# Patient Record
Sex: Female | Born: 1985 | Race: White | Hispanic: No | Marital: Married | Smoking: Never smoker
Health system: Southern US, Community
[De-identification: ages and names within clinical notes are randomized; demographics above are authoritative.]

## PROBLEM LIST (undated history)

## (undated) DIAGNOSIS — C801 Malignant (primary) neoplasm, unspecified: Secondary | ICD-10-CM

## (undated) DIAGNOSIS — J45909 Unspecified asthma, uncomplicated: Secondary | ICD-10-CM

## (undated) DIAGNOSIS — G43909 Migraine, unspecified, not intractable, without status migrainosus: Secondary | ICD-10-CM

## (undated) DIAGNOSIS — I10 Essential (primary) hypertension: Secondary | ICD-10-CM

## (undated) DIAGNOSIS — N390 Urinary tract infection, site not specified: Secondary | ICD-10-CM

## (undated) HISTORY — DX: Urinary tract infection, site not specified: N39.0

## (undated) HISTORY — PX: ESOPHAGOGASTRODUODENOSCOPY: SHX1529

## (undated) HISTORY — DX: Unspecified asthma, uncomplicated: J45.909

## (undated) HISTORY — DX: Migraine, unspecified, not intractable, without status migrainosus: G43.909

---
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
## 2003-04-23 ENCOUNTER — Other Ambulatory Visit: Admission: RE | Admit: 2003-04-23 | Discharge: 2003-04-23 | Payer: Self-pay | Admitting: Obstetrics and Gynecology

## 2004-06-17 ENCOUNTER — Other Ambulatory Visit: Admission: RE | Admit: 2004-06-17 | Discharge: 2004-06-17 | Payer: Self-pay | Admitting: Obstetrics and Gynecology

## 2006-05-04 ENCOUNTER — Encounter: Admission: RE | Admit: 2006-05-04 | Discharge: 2006-05-04 | Payer: Self-pay | Admitting: Orthopedic Surgery

## 2012-11-16 ENCOUNTER — Telehealth: Payer: Self-pay | Admitting: Certified Nurse Midwife

## 2012-11-16 NOTE — Telephone Encounter (Signed)
Patient C/O Possible UTI. Can't find an appointment .

## 2012-11-16 NOTE — Telephone Encounter (Signed)
LMTCB 11/16/12 cm

## 2012-11-16 NOTE — Telephone Encounter (Signed)
LMTCB

## 2012-11-20 NOTE — Telephone Encounter (Signed)
Left message on CB# to return call concerning phone call of 06/27/ for UTI.

## 2012-11-21 NOTE — Telephone Encounter (Signed)
Follow up  Phone call with patient from 11/16/2012 with C/O UTI.  Patient stated she wound up going to her primary doctor for Tx of symptoms and is ok now and on medications.

## 2012-11-27 DIAGNOSIS — J45998 Other asthma: Secondary | ICD-10-CM | POA: Insufficient documentation

## 2013-04-09 ENCOUNTER — Encounter: Payer: Self-pay | Admitting: Certified Nurse Midwife

## 2013-04-11 ENCOUNTER — Encounter: Payer: Self-pay | Admitting: Certified Nurse Midwife

## 2013-04-11 ENCOUNTER — Ambulatory Visit (INDEPENDENT_AMBULATORY_CARE_PROVIDER_SITE_OTHER): Payer: PRIVATE HEALTH INSURANCE | Admitting: Certified Nurse Midwife

## 2013-04-11 VITALS — BP 116/78 | HR 64 | Resp 14 | Ht 65.5 in | Wt 201.0 lb

## 2013-04-11 DIAGNOSIS — Z309 Encounter for contraceptive management, unspecified: Secondary | ICD-10-CM

## 2013-04-11 DIAGNOSIS — Z01419 Encounter for gynecological examination (general) (routine) without abnormal findings: Secondary | ICD-10-CM

## 2013-04-11 DIAGNOSIS — Z0000 Encounter for general adult medical examination without abnormal findings: Secondary | ICD-10-CM

## 2013-04-11 DIAGNOSIS — J329 Chronic sinusitis, unspecified: Secondary | ICD-10-CM | POA: Insufficient documentation

## 2013-04-11 DIAGNOSIS — R319 Hematuria, unspecified: Secondary | ICD-10-CM

## 2013-04-11 LAB — POCT URINALYSIS DIPSTICK: Blood, UA: 2

## 2013-04-11 MED ORDER — LEVONORGEST-ETH ESTRAD 91-DAY 0.15-0.03 &0.01 MG PO TABS: 1.0000 | ORAL_TABLET | Freq: Every day | ORAL | Status: DC

## 2013-04-11 NOTE — Patient Instructions (Signed)
General topics  Next pap or exam is  due in 1 year Take a Women's multivitamin Take 1200 mg. of calcium daily - prefer dietary If any concerns in interim to call back  Breast Self-Awareness Practicing breast self-awareness may pick up problems early, prevent significant medical complications, and possibly save your life. By practicing breast self-awareness, you can become familiar with how your breasts look and feel and if your breasts are changing. This allows you to notice changes early. It can also offer you some reassurance that your breast health is good. One way to learn what is normal for your breasts and whether your breasts are changing is to do a breast self-exam. If you find a lump or something that was not present in the past, it is best to contact your caregiver right away. Other findings that should be evaluated by your caregiver include nipple discharge, especially if it is bloody; skin changes or reddening; areas where the skin seems to be pulled in (retracted); or new lumps and bumps. Breast pain is seldom associated with cancer (malignancy), but should also be evaluated by a caregiver. BREAST SELF-EXAM The best time to examine your breasts is 5 7 days after your menstrual period is over.  ExitCare Patient Information 2013 [employer].   Exercise to Stay Healthy Exercise helps you become and stay healthy. EXERCISE IDEAS AND TIPS Choose exercises that:  You enjoy.  Fit into your day. You do not need to exercise really hard to be healthy. You can do exercises at a slow or medium level and stay healthy. You can:  Stretch before and after working out.  Try yoga, Pilates, or tai chi.  Lift weights.  Walk fast, swim, jog, run, climb stairs, bicycle, dance, or rollerskate.  Take aerobic classes. Exercises that burn about 150 calories:  Running 1  miles in 15 minutes.  Playing volleyball for 45 to 60 minutes.  Washing and waxing a car for 45 to 60  minutes.  Playing touch football for 45 minutes.  Walking 1  miles in 35 minutes.  Pushing a stroller 1  miles in 30 minutes.  Playing basketball for 30 minutes.  Raking leaves for 30 minutes.  Bicycling 5 miles in 30 minutes.  Walking 2 miles in 30 minutes.  Dancing for 30 minutes.  Shoveling snow for 15 minutes.  Swimming laps for 20 minutes.  Walking up stairs for 15 minutes.  Bicycling 4 miles in 15 minutes.  Gardening for 30 to 45 minutes.  Jumping rope for 15 minutes.  Washing windows or floors for 45 to 60 minutes. Document Released: 06/11/2010 Document Revised: 08/01/2011 Document Reviewed: 06/11/2010 ExitCare Patient Information 2013 [employer].   Other topics ( that may be useful information):    Sexually Transmitted Disease Sexually transmitted disease (STD) refers to any infection that is passed from person to person during sexual activity. This may happen by way of saliva, semen, blood, vaginal mucus, or urine. Common STDs include:  Gonorrhea.  Chlamydia.  Syphilis.  HIV/AIDS.  Genital herpes.  Hepatitis B and C.  Trichomonas.  Human papillomavirus (HPV).  Pubic lice. CAUSES  An STD may be spread by bacteria, virus, or parasite. A person can get an STD by:  Sexual intercourse with an infected person.  Sharing sex toys with an infected person.  Sharing needles with an infected person.  Having intimate contact with the genitals, mouth, or rectal areas of an infected person. SYMPTOMS  Some people may not have any symptoms, but   they can still pass the infection to others. Different STDs have different symptoms. Symptoms include:  Painful or bloody urination.  Pain in the pelvis, abdomen, vagina, anus, throat, or eyes.  Skin rash, itching, irritation, growths, or sores (lesions). These usually occur in the genital or anal area.  Abnormal vaginal discharge.  Penile discharge in men.  Soft, flesh-colored skin growths in the  genital or anal area.  Fever.  Pain or bleeding during sexual intercourse.  Swollen glands in the groin area.  Yellow skin and eyes (jaundice). This is seen with hepatitis. DIAGNOSIS  To make a diagnosis, your caregiver may:  Take a medical history.  Perform a physical exam.  Take a specimen (culture) to be examined.  Examine a sample of discharge under a microscope.  Perform blood test TREATMENT   Chlamydia, gonorrhea, trichomonas, and syphilis can be cured with antibiotic medicine.  Genital herpes, hepatitis, and HIV can be treated, but not cured, with prescribed medicines. The medicines will lessen the symptoms.  Genital warts from HPV can be treated with medicine or by freezing, burning (electrocautery), or surgery. Warts may come back.  HPV is a virus and cannot be cured with medicine or surgery.However, abnormal areas may be followed very closely by your caregiver and may be removed from the cervix, vagina, or vulva through office procedures or surgery. If your diagnosis is confirmed, your recent sexual partners need treatment. This is true even if they are symptom-free or have a negative culture or evaluation. They should not have sex until their caregiver says it is okay. HOME CARE INSTRUCTIONS  All sexual partners should be informed, tested, and treated for all STDs.  Take your antibiotics as directed. Finish them even if you start to feel better.  Only take over-the-counter or prescription medicines for pain, discomfort, or fever as directed by your caregiver.  Rest.  Eat a balanced diet and drink enough fluids to keep your urine clear or pale yellow.  Do not have sex until treatment is completed and you have followed up with your caregiver. STDs should be checked after treatment.  Keep all follow-up appointments, Pap tests, and blood tests as directed by your caregiver.  Only use latex condoms and water-soluble lubricants during sexual activity. Do not use  petroleum jelly or oils.  Avoid alcohol and illegal drugs.  Get vaccinated for HPV and hepatitis. If you have not received these vaccines in the past, talk to your caregiver about whether one or both might be right for you.  Avoid risky sex practices that can break the skin. The only way to avoid getting an STD is to avoid all sexual activity.Latex condoms and dental dams (for oral sex) will help lessen the risk of getting an STD, but will not completely eliminate the risk. SEEK MEDICAL CARE IF:   You have a fever.  You have any new or worsening symptoms. Document Released: 07/30/2002 Document Revised: 08/01/2011 Document Reviewed: 08/06/2010 ExitCare Patient Information 2013 [employer].    Domestic Abuse You are being battered or abused if someone close to you hits, pushes, or physically hurts you in any way. You also are being abused if you are forced into activities. You are being sexually abused if you are forced to have sexual contact of any kind. You are being emotionally abused if you are made to feel worthless or if you are constantly threatened. It is important to remember that help is available. No one has the right to abuse you. PREVENTION OF FURTHER   ABUSE  Learn the warning signs of danger. This varies with situations but may include: the use of alcohol, threats, isolation from friends and family, or forced sexual contact. Leave if you feel that violence is going to occur.  If you are attacked or beaten, report it to the police so the abuse is documented. You do not have to press charges. The police can protect you while you or the attackers are leaving. Get the officer's name and badge number and a copy of the report.  Find someone you can trust and tell them what is happening to you: your caregiver, a nurse, clergy member, close friend or family member. Feeling ashamed is natural, but remember that you have done nothing wrong. No one deserves abuse. Document Released:  05/06/2000 Document Revised: 08/01/2011 Document Reviewed: 07/15/2010 ExitCare Patient Information 2013 [employer].    How Much is Too Much Alcohol? Drinking too much alcohol can cause injury, accidents, and health problems. These types of problems can include:   Car crashes.  Falls.  Family fighting (domestic violence).  Drowning.  Fights.  Injuries.  Burns.  Damage to certain organs.  Having a baby with birth defects. ONE DRINK CAN BE TOO MUCH WHEN YOU ARE:  Working.  Pregnant or breastfeeding.  Taking medicines. Ask your doctor.  Driving or planning to drive. If you or someone you know has a drinking problem, get help from a doctor.  Document Released: 03/05/2009 Document Revised: 08/01/2011 Document Reviewed: 03/05/2009 ExitCare Patient Information 2013 [employer].   Smoking Hazards Smoking cigarettes is extremely bad for your health. Tobacco smoke has over 200 known poisons in it. There are over 60 chemicals in tobacco smoke that cause cancer. Some of the chemicals found in cigarette smoke include:   Cyanide.  Benzene.  Formaldehyde.  Methanol (wood alcohol).  Acetylene (fuel used in welding torches).  Ammonia. Cigarette smoke also contains the poisonous gases nitrogen oxide and carbon monoxide.  Cigarette smokers have an increased risk of many serious medical problems and Smoking causes approximately:  90% of all lung cancer deaths in men.  80% of all lung cancer deaths in women.  90% of deaths from chronic obstructive lung disease. Compared with nonsmokers, smoking increases the risk of:  Coronary heart disease by 2 to 4 times.  Stroke by 2 to 4 times.  Men developing lung cancer by 23 times.  Women developing lung cancer by 13 times.  Dying from chronic obstructive lung diseases by 12 times.  . Smoking is the most preventable cause of death and disease in our society.  WHY IS SMOKING ADDICTIVE?  Nicotine is the chemical  agent in tobacco that is capable of causing addiction or dependence.  When you smoke and inhale, nicotine is absorbed rapidly into the bloodstream through your lungs. Nicotine absorbed through the lungs is capable of creating a powerful addiction. Both inhaled and non-inhaled nicotine may be addictive.  Addiction studies of cigarettes and spit tobacco show that addiction to nicotine occurs mainly during the teen years, when young people begin using tobacco products. WHAT ARE THE BENEFITS OF QUITTING?  There are many health benefits to quitting smoking.   Likelihood of developing cancer and heart disease decreases. Health improvements are seen almost immediately.  Blood pressure, pulse rate, and breathing patterns start returning to normal soon after quitting. QUITTING SMOKING   American Lung Association - 1-800-LUNGUSA  American Cancer Society - 1-800-ACS-2345 Document Released: 06/16/2004 Document Revised: 08/01/2011 Document Reviewed: 02/18/2009 ExitCare Patient Information 2013 [employer].   Stress Management Stress is a state of physical or mental tension that often results from changes in your life or normal routine. Some common causes of stress are:  Death of a loved one.  Injuries or severe illnesses.  Getting fired or changing jobs.  Moving into a new home. Other causes may be:  Sexual problems.  Business or financial losses.  Taking on a large debt.  Regular conflict with someone at home or at work.  Constant tiredness from lack of sleep. It is not just bad things that are stressful. It may be stressful to:  Win the lottery.  Get married.  Buy a new car. The amount of stress that can be easily tolerated varies from person to person. Changes generally cause stress, regardless of the types of change. Too much stress can affect your health. It may lead to physical or emotional problems. Too little stress (boredom) may also become stressful. SUGGESTIONS TO  REDUCE STRESS:  Talk things over with your family and friends. It often is helpful to share your concerns and worries. If you feel your problem is serious, you may want to get help from a professional counselor.  Consider your problems one at a time instead of lumping them all together. Trying to take care of everything at once may seem impossible. List all the things you need to do and then start with the most important one. Set a goal to accomplish 2 or 3 things each day. If you expect to do too many in a single day you will naturally fail, causing you to feel even more stressed.  Do not use alcohol or drugs to relieve stress. Although you may feel better for a short time, they do not remove the problems that caused the stress. They can also be habit forming.  Exercise regularly - at least 3 times per week. Physical exercise can help to relieve that "uptight" feeling and will relax you.  The shortest distance between despair and hope is often a good night's sleep.  Go to bed and get up on time allowing yourself time for appointments without being rushed.  Take a short "time-out" period from any stressful situation that occurs during the day. Close your eyes and take some deep breaths. Starting with the muscles in your face, tense them, hold it for a few seconds, then relax. Repeat this with the muscles in your neck, shoulders, hand, stomach, back and legs.  Take good care of yourself. Eat a balanced diet and get plenty of rest.  Schedule time for having fun. Take a break from your daily routine to relax. HOME CARE INSTRUCTIONS   Call if you feel overwhelmed by your problems and feel you can no longer manage them on your own.  Return immediately if you feel like hurting yourself or someone else. Document Released: 11/02/2000 Document Revised: 08/01/2011 Document Reviewed: 06/25/2007 ExitCare Patient Information 2013 [employer].   

## 2013-04-11 NOTE — Progress Notes (Signed)
27 y.o. G0P0000 Single Caucasian Fe here for annual exam. Periods normal with extended cycle OCP. Asthma flaring now, but using medications, more stable now.Misty Combs PCP prn and allergist at [facility]. Patient denies UTI symptoms of frequency,urgency, or pain. Recent sexual activity in past 24 hours. History of UTI with sexual activity occasionally. Denies back pain or fever or chills. Patient has resumed exercise now that asthma stable. Requests Hgb A1C due work health recommendation with family history and health profile. No other health issues today.    Patient's last menstrual period was 03/22/2013.          Sexually active: yes  The current method of family planning is OCP (estrogen/progesterone).    Exercising: yes  running, weights, yoga, hiking, biking 4-5x/wk Smoker:  no  Health Maintenance: Pap:  04/05/10 Negative MMG:  none Colonoscopy:  none BMD:   none TDaP:  2006 Labs: Hgb: 13 ; Urine: Leuks 2, Blood 2, Trace Protein    reports that she has never smoked. She does not have any smokeless tobacco history on file. She reports that she drinks about 0.5 ounces of alcohol per week. She reports that she does not use illicit drugs.  Past Medical History  Diagnosis Date  . Migraines     no aura  . UTI (lower urinary tract infection)     post coital  . Asthma     exercise induced  . Chronic sinusitis     History reviewed. No pertinent past surgical history.  Current Outpatient Prescriptions  Medication Sig Dispense Refill  . albuterol (PROVENTIL HFA;VENTOLIN HFA) 108 (90 BASE) MCG/ACT inhaler Inhale into the lungs every 6 (six) hours as needed for wheezing or shortness of breath.      . Ascorbic Acid (VITAMIN C PO) Take by mouth daily.      . fluticasone-salmeterol (ADVAIR HFA) 115-21 MCG/ACT inhaler Inhale 2 puffs into the lungs 2 (two) times daily.      . Levonorgestrel-Ethinyl Estradiol (SEASONIQUE) 0.15-0.03 &0.01 MG tablet Take 1 tablet by mouth daily.      . Multiple  Vitamins-Minerals (MULTIVITAMIN PO) Take by mouth daily.       No current facility-administered medications for this visit.    Family History  Problem Relation Age of Onset  . Hypertension Mother   . Heart disease Father     ROS:  Pertinent items are noted in HPI.  Otherwise, a comprehensive ROS was negative.  Exam:   BP 116/78  Pulse 64  Resp 14  Ht 5' 5.5" (1.664 m)  Wt 201 lb (91.173 kg)  BMI 32.93 kg/m2  LMP 03/22/2013 Height: 5' 5.5" (166.4 cm)  Ht Readings from Last 3 Encounters:  04/11/13 5' 5.5" (1.664 m)    General appearance: alert, cooperative and appears stated age Head: Normocephalic, without obvious abnormality, atraumatic Neck: no adenopathy, supple, symmetrical, trachea midline and thyroid normal to inspection and palpation Lungs: clear to auscultation bilaterally Breasts: normal appearance, no masses or tenderness, No nipple retraction or dimpling, No nipple discharge or bleeding, No axillary or supraclavicular adenopathy Heart: regular rate and rhythm Abdomen: soft, non-tender; no masses,  no organomegaly Extremities: extremities normal, atraumatic, no cyanosis or edema Skin: Skin color, texture, turgor normal. No rashes or lesions Lymph nodes: Cervical, supraclavicular, and axillary nodes normal. No abnormal inguinal nodes palpated Neurologic: Grossly normal   Pelvic: External genitalia:  no lesions              Urethra:  normal appearing urethra with no masses,  tenderness or lesions              Bartholin's and Skene's: normal                 Vagina: normal appearing vagina with normal color and discharge, no lesions              Cervix: normal, non tender              Pap taken: yes Bimanual Exam:  Uterus:  normal size, contour, position, consistency, mobility, non-tender and anteverted              Adnexa: normal adnexa and no mass, fullness, tenderness               Rectovaginal: Confirms               Anus:  normal sphincter tone, no  lesions  A:  Well Woman with normal exam  Contraception OCP desired  History of Post coital UTI has 2+ blood,2+ WBC in urine  Wellness profile at work recommends glucose screen  P:   Reviewed health and wellness pertinent  Rx Seasonique see order  Aware of need to empty bladder past coitus and increase water.Lab: Urine culture, micro. If positive will treat and consider prophylactic Macrobid use.  Lab Hgb A1C  Pap smear as per guidelines   pap smear taken today with reflex  counseled on breast self exam, use and side effects of OCP's, adequate intake of calcium and vitamin D, diet and exercise  return annually or prn  An After Visit Summary was printed and given to the patient.

## 2013-04-12 LAB — URINALYSIS, MICROSCOPIC ONLY
Casts: NONE SEEN
Crystals: NONE SEEN

## 2013-04-13 LAB — URINE CULTURE: Colony Count: >=100000

## 2013-04-15 ENCOUNTER — Other Ambulatory Visit: Payer: Self-pay | Admitting: Certified Nurse Midwife

## 2013-04-15 NOTE — Progress Notes (Signed)
Note reviewed, agree with plan.  Degan Hanser, MD  

## 2013-04-16 ENCOUNTER — Telehealth: Payer: Self-pay

## 2013-04-16 ENCOUNTER — Other Ambulatory Visit: Payer: Self-pay | Admitting: Certified Nurse Midwife

## 2013-04-16 DIAGNOSIS — N390 Urinary tract infection, site not specified: Secondary | ICD-10-CM

## 2013-04-16 MED ORDER — AZITHROMYCIN 1 G PO PACK: 1.0000 | PACK | Freq: Once | ORAL | Status: DC

## 2013-04-16 NOTE — Telephone Encounter (Signed)
Returning a call to Joy. °

## 2013-04-16 NOTE — Telephone Encounter (Signed)
Message copied by Eliezer Bottom on Tue Apr 16, 2013  3:59 PM ------      Message from: Verner Chol      Created: Tue Apr 16, 2013  8:44 AM       Notify patient that her urine culture was positive for group B Strep needs Rx Azithromycin order in      Recheck urine in 2 weeks TOC culture no ov, order in      Hgb A1c normal      Pap smear nornal      02 ------

## 2013-04-16 NOTE — Telephone Encounter (Signed)
lmtcb

## 2013-04-17 NOTE — Telephone Encounter (Signed)
Patient notified of results.

## 2013-05-01 ENCOUNTER — Ambulatory Visit (INDEPENDENT_AMBULATORY_CARE_PROVIDER_SITE_OTHER): Payer: PRIVATE HEALTH INSURANCE | Admitting: *Deleted

## 2013-05-01 DIAGNOSIS — N390 Urinary tract infection, site not specified: Secondary | ICD-10-CM

## 2013-05-03 LAB — URINE CULTURE
Colony Count: NO GROWTH
Organism ID, Bacteria: NO GROWTH

## 2013-06-13 ENCOUNTER — Other Ambulatory Visit: Payer: Self-pay | Admitting: Certified Nurse Midwife

## 2014-04-22 ENCOUNTER — Encounter: Payer: Self-pay | Admitting: Certified Nurse Midwife

## 2014-04-22 ENCOUNTER — Ambulatory Visit (INDEPENDENT_AMBULATORY_CARE_PROVIDER_SITE_OTHER): Payer: PRIVATE HEALTH INSURANCE | Admitting: Certified Nurse Midwife

## 2014-04-22 VITALS — BP 118/78 | HR 68 | Resp 16 | Ht 64.75 in | Wt 198.6 lb

## 2014-04-22 DIAGNOSIS — B3731 Acute candidiasis of vulva and vagina: Secondary | ICD-10-CM

## 2014-04-22 DIAGNOSIS — Z0000 Encounter for general adult medical examination without abnormal findings: Secondary | ICD-10-CM

## 2014-04-22 DIAGNOSIS — Z23 Encounter for immunization: Secondary | ICD-10-CM

## 2014-04-22 DIAGNOSIS — Z3041 Encounter for surveillance of contraceptive pills: Secondary | ICD-10-CM

## 2014-04-22 DIAGNOSIS — B373 Candidiasis of vulva and vagina: Secondary | ICD-10-CM

## 2014-04-22 DIAGNOSIS — Z01419 Encounter for gynecological examination (general) (routine) without abnormal findings: Secondary | ICD-10-CM

## 2014-04-22 LAB — POCT URINALYSIS DIPSTICK
Bilirubin, UA: NEGATIVE
GLUCOSE UA: NEGATIVE
KETONES UA: NEGATIVE
Nitrite, UA: NEGATIVE
PH UA: 5
PROTEIN UA: NEGATIVE
RBC UA: NEGATIVE
Urobilinogen, UA: NEGATIVE

## 2014-04-22 MED ORDER — TERCONAZOLE 0.4 % VA CREA: 1.0000 | TOPICAL_CREAM | Freq: Every day | VAGINAL | Status: DC

## 2014-04-22 MED ORDER — LEVONORGEST-ETH ESTRAD 91-DAY 0.15-0.03 &0.01 MG PO TABS: 1.0000 | ORAL_TABLET | Freq: Every day | ORAL | Status: DC

## 2014-04-22 NOTE — Patient Instructions (Signed)
General topics  Next pap or exam is  due in 1 year Take a Women's multivitamin Take 1200 mg. of calcium daily - prefer dietary If any concerns in interim to call back  Breast Self-Awareness Practicing breast self-awareness may pick up problems early, prevent significant medical complications, and possibly save your life. By practicing breast self-awareness, you can become familiar with how your breasts look and feel and if your breasts are changing. This allows you to notice changes early. It can also offer you some reassurance that your breast health is good. One way to learn what is normal for your breasts and whether your breasts are changing is to do a breast self-exam. If you find a lump or something that was not present in the past, it is best to contact your caregiver right away. Other findings that should be evaluated by your caregiver include nipple discharge, especially if it is bloody; skin changes or reddening; areas where the skin seems to be pulled in (retracted); or new lumps and bumps. Breast pain is seldom associated with cancer (malignancy), but should also be evaluated by a caregiver. BREAST SELF-EXAM The best time to examine your breasts is 5 7 days after your menstrual period is over.  ExitCare Patient Information 2013 [employer].   Exercise to Stay Healthy Exercise helps you become and stay healthy. EXERCISE IDEAS AND TIPS Choose exercises that:  You enjoy.  Fit into your day. You do not need to exercise really hard to be healthy. You can do exercises at a slow or medium level and stay healthy. You can:  Stretch before and after working out.  Try yoga, Pilates, or tai chi.  Lift weights.  Walk fast, swim, jog, run, climb stairs, bicycle, dance, or rollerskate.  Take aerobic classes. Exercises that burn about 150 calories:  Running 1  miles in 15 minutes.  Playing volleyball for 45 to 60 minutes.  Washing and waxing a car for 45 to 60  minutes.  Playing touch football for 45 minutes.  Walking 1  miles in 35 minutes.  Pushing a stroller 1  miles in 30 minutes.  Playing basketball for 30 minutes.  Raking leaves for 30 minutes.  Bicycling 5 miles in 30 minutes.  Walking 2 miles in 30 minutes.  Dancing for 30 minutes.  Shoveling snow for 15 minutes.  Swimming laps for 20 minutes.  Walking up stairs for 15 minutes.  Bicycling 4 miles in 15 minutes.  Gardening for 30 to 45 minutes.  Jumping rope for 15 minutes.  Washing windows or floors for 45 to 60 minutes. Document Released: 06/11/2010 Document Revised: 08/01/2011 Document Reviewed: 06/11/2010 ExitCare Patient Information 2013 [employer].   Other topics ( that may be useful information):    Sexually Transmitted Disease Sexually transmitted disease (STD) refers to any infection that is passed from person to person during sexual activity. This may happen by way of saliva, semen, blood, vaginal mucus, or urine. Common STDs include:  Gonorrhea.  Chlamydia.  Syphilis.  HIV/AIDS.  Genital herpes.  Hepatitis B and C.  Trichomonas.  Human papillomavirus (HPV).  Pubic lice. CAUSES  An STD may be spread by bacteria, virus, or parasite. A person can get an STD by:  Sexual intercourse with an infected person.  Sharing sex toys with an infected person.  Sharing needles with an infected person.  Having intimate contact with the genitals, mouth, or rectal areas of an infected person. SYMPTOMS  Some people may not have any symptoms, but   they can still pass the infection to others. Different STDs have different symptoms. Symptoms include:  Painful or bloody urination.  Pain in the pelvis, abdomen, vagina, anus, throat, or eyes.  Skin rash, itching, irritation, growths, or sores (lesions). These usually occur in the genital or anal area.  Abnormal vaginal discharge.  Penile discharge in men.  Soft, flesh-colored skin growths in the  genital or anal area.  Fever.  Pain or bleeding during sexual intercourse.  Swollen glands in the groin area.  Yellow skin and eyes (jaundice). This is seen with hepatitis. DIAGNOSIS  To make a diagnosis, your caregiver may:  Take a medical history.  Perform a physical exam.  Take a specimen (culture) to be examined.  Examine a sample of discharge under a microscope.  Perform blood test TREATMENT   Chlamydia, gonorrhea, trichomonas, and syphilis can be cured with antibiotic medicine.  Genital herpes, hepatitis, and HIV can be treated, but not cured, with prescribed medicines. The medicines will lessen the symptoms.  Genital warts from HPV can be treated with medicine or by freezing, burning (electrocautery), or surgery. Warts may come back.  HPV is a virus and cannot be cured with medicine or surgery.However, abnormal areas may be followed very closely by your caregiver and may be removed from the cervix, vagina, or vulva through office procedures or surgery. If your diagnosis is confirmed, your recent sexual partners need treatment. This is true even if they are symptom-free or have a negative culture or evaluation. They should not have sex until their caregiver says it is okay. HOME CARE INSTRUCTIONS  All sexual partners should be informed, tested, and treated for all STDs.  Take your antibiotics as directed. Finish them even if you start to feel better.  Only take over-the-counter or prescription medicines for pain, discomfort, or fever as directed by your caregiver.  Rest.  Eat a balanced diet and drink enough fluids to keep your urine clear or pale yellow.  Do not have sex until treatment is completed and you have followed up with your caregiver. STDs should be checked after treatment.  Keep all follow-up appointments, Pap tests, and blood tests as directed by your caregiver.  Only use latex condoms and water-soluble lubricants during sexual activity. Do not use  petroleum jelly or oils.  Avoid alcohol and illegal drugs.  Get vaccinated for HPV and hepatitis. If you have not received these vaccines in the past, talk to your caregiver about whether one or both might be right for you.  Avoid risky sex practices that can break the skin. The only way to avoid getting an STD is to avoid all sexual activity.Latex condoms and dental dams (for oral sex) will help lessen the risk of getting an STD, but will not completely eliminate the risk. SEEK MEDICAL CARE IF:   You have a fever.  You have any new or worsening symptoms. Document Released: 07/30/2002 Document Revised: 08/01/2011 Document Reviewed: 08/06/2010 [facility] Patient Information 2013 [employer].    Domestic Abuse You are being battered or abused if someone close to you hits, pushes, or physically hurts you in any way. You also are being abused if you are forced into activities. You are being sexually abused if you are forced to have sexual contact of any kind. You are being emotionally abused if you are made to feel worthless or if you are constantly threatened. It is important to remember that help is available. No one has the right to abuse you. PREVENTION OF FURTHER  ABUSE  Learn the warning signs of danger. This varies with situations but may include: the use of alcohol, threats, isolation from friends and family, or forced sexual contact. Leave if you feel that violence is going to occur.  If you are attacked or beaten, report it to the police so the abuse is documented. You do not have to press charges. The police can protect you while you or the attackers are leaving. Get the officer's name and badge number and a copy of the report.  Find someone you can trust and tell them what is happening to you: your caregiver, a nurse, clergy member, close friend or family member. Feeling ashamed is natural, but remember that you have done nothing wrong. No one deserves abuse. Document Released:  05/06/2000 Document Revised: 08/01/2011 Document Reviewed: 07/15/2010 ExitCare Patient Information 2013 [employer].    How Much is Too Much Alcohol? Drinking too much alcohol can cause injury, accidents, and health problems. These types of problems can include:   Car crashes.  Falls.  Family fighting (domestic violence).  Drowning.  Fights.  Injuries.  Burns.  Damage to certain organs.  Having a baby with birth defects. ONE DRINK CAN BE TOO MUCH WHEN YOU ARE:  Working.  Pregnant or breastfeeding.  Taking medicines. Ask your doctor.  Driving or planning to drive. If you or someone you know has a drinking problem, get help from a doctor.  Document Released: 03/05/2009 Document Revised: 08/01/2011 Document Reviewed: 03/05/2009 ExitCare Patient Information 2013 [employer].   Smoking Hazards Smoking cigarettes is extremely bad for your health. Tobacco smoke has over 200 known poisons in it. There are over 60 chemicals in tobacco smoke that cause cancer. Some of the chemicals found in cigarette smoke include:   Cyanide.  Benzene.  Formaldehyde.  Methanol (wood alcohol).  Acetylene (fuel used in welding torches).  Ammonia. Cigarette smoke also contains the poisonous gases nitrogen oxide and carbon monoxide.  Cigarette smokers have an increased risk of many serious medical problems and Smoking causes approximately:  90% of all lung cancer deaths in men.  80% of all lung cancer deaths in women.  90% of deaths from chronic obstructive lung disease. Compared with nonsmokers, smoking increases the risk of:  Coronary heart disease by 2 to 4 times.  Stroke by 2 to 4 times.  Men developing lung cancer by 23 times.  Women developing lung cancer by 13 times.  Dying from chronic obstructive lung diseases by 12 times.  . Smoking is the most preventable cause of death and disease in our society.  WHY IS SMOKING ADDICTIVE?  Nicotine is the chemical  agent in tobacco that is capable of causing addiction or dependence.  When you smoke and inhale, nicotine is absorbed rapidly into the bloodstream through your lungs. Nicotine absorbed through the lungs is capable of creating a powerful addiction. Both inhaled and non-inhaled nicotine may be addictive.  Addiction studies of cigarettes and spit tobacco show that addiction to nicotine occurs mainly during the teen years, when young people begin using tobacco products. WHAT ARE THE BENEFITS OF QUITTING?  There are many health benefits to quitting smoking.   Likelihood of developing cancer and heart disease decreases. Health improvements are seen almost immediately.  Blood pressure, pulse rate, and breathing patterns start returning to normal soon after quitting. QUITTING SMOKING   American Lung Association - 1-800-LUNGUSA  American Cancer Society - 1-800-ACS-2345 Document Released: 06/16/2004 Document Revised: 08/01/2011 Document Reviewed: 02/18/2009 ExitCare Patient Information 2013 ExitCare[employer]   LLC.   Stress Management Stress is a state of physical or mental tension that often results from changes in your life or normal routine. Some common causes of stress are:  Death of a loved one.  Injuries or severe illnesses.  Getting fired or changing jobs.  Moving into a new home. Other causes may be:  Sexual problems.  Business or financial losses.  Taking on a large debt.  Regular conflict with someone at home or at work.  Constant tiredness from lack of sleep. It is not just bad things that are stressful. It may be stressful to:  Win the lottery.  Get married.  Buy a new car. The amount of stress that can be easily tolerated varies from person to person. Changes generally cause stress, regardless of the types of change. Too much stress can affect your health. It may lead to physical or emotional problems. Too little stress (boredom) may also become stressful. SUGGESTIONS TO  REDUCE STRESS:  Talk things over with your family and friends. It often is helpful to share your concerns and worries. If you feel your problem is serious, you may want to get help from a professional counselor.  Consider your problems one at a time instead of lumping them all together. Trying to take care of everything at once may seem impossible. List all the things you need to do and then start with the most important one. Set a goal to accomplish 2 or 3 things each day. If you expect to do too many in a single day you will naturally fail, causing you to feel even more stressed.  Do not use alcohol or drugs to relieve stress. Although you may feel better for a short time, they do not remove the problems that caused the stress. They can also be habit forming.  Exercise regularly - at least 3 times per week. Physical exercise can help to relieve that "uptight" feeling and will relax you.  The shortest distance between despair and hope is often a good night's sleep.  Go to bed and get up on time allowing yourself time for appointments without being rushed.  Take a short "time-out" period from any stressful situation that occurs during the day. Close your eyes and take some deep breaths. Starting with the muscles in your face, tense them, hold it for a few seconds, then relax. Repeat this with the muscles in your neck, shoulders, hand, stomach, back and legs.  Take good care of yourself. Eat a balanced diet and get plenty of rest.  Schedule time for having fun. Take a break from your daily routine to relax. HOME CARE INSTRUCTIONS   Call if you feel overwhelmed by your problems and feel you can no longer manage them on your own.  Return immediately if you feel like hurting yourself or someone else. Document Released: 11/02/2000 Document Revised: 08/01/2011 Document Reviewed: 06/25/2007 ExitCare Patient Information 2013 [employer]Care[employer].   

## 2014-04-22 NOTE — Progress Notes (Signed)
28 y.o. G0P0000 Single Caucasian Fe here for annual exam.  Periods normal , no issues. Partner change, desires STD screening. Patient complaining of fatigue, to the point of needing a nap. No job change or extra hours. Eating well. No issues. Complaining of vaginal itching and increase discharge. No new personal products.  Denies any vaginal odor.  Sees PCP for asthma management. No other problems today.  Patient's last menstrual period was 03/17/2014.          Sexually active: Yes.    The current method of family planning is OCP (estrogen/progesterone).    Exercising: Yes.    running, weights, biking, and hiking Smoker:  no  Health Maintenance: Pap:  04/11/13 WNL MMG:  none Colonoscopy:  none BMD:   none TDaP:  2006-had reaction of swelling and fever Labs: donated blood, URINE-WBC-1+   reports that she has never smoked. She has never used smokeless tobacco. She reports that she drinks about 0.6 oz of alcohol per week. She reports that she does not use illicit drugs.  Past Medical History  Diagnosis Date  . Migraines     no aura  . UTI (lower urinary tract infection)     post coital  . Asthma     exercise induced    History reviewed. No pertinent past surgical history.  Current Outpatient Prescriptions  Medication Sig Dispense Refill  . albuterol (PROVENTIL HFA;VENTOLIN HFA) 108 (90 BASE) MCG/ACT inhaler Inhale into the lungs every 6 (six) hours as needed for wheezing or shortness of breath.    . Ascorbic Acid (VITAMIN C PO) Take by mouth daily.    Marland Kitchen azelastine (ASTELIN) 0.1 % nasal spray Place into the nose.    . budesonide (PULMICORT) 0.5 MG/2ML nebulizer solution USE 2 MLS (0.5 MG TOTAL) BY NEBULIZATION 2 (TWO) TIMES DAILY. NASAL RINSE    . CRANBERRY CONCENTRATE PO Take by mouth. c-bacillus coag 250-30-50    . fluticasone-salmeterol (ADVAIR HFA) 115-21 MCG/ACT inhaler Inhale 2 puffs into the lungs 2 (two) times daily.    . Levonorgestrel-Ethinyl Estradiol (SEASONIQUE)  0.15-0.03 &0.01 MG tablet Take 1 tablet by mouth daily. 1 Package 12  . Multiple Vitamins-Minerals (MULTIVITAMIN PO) Take by mouth daily.     No current facility-administered medications for this visit.    Family History  Problem Relation Age of Onset  . Hypertension Mother   . Heart disease Father     ROS:  Pertinent items are noted in HPI.  Otherwise, a comprehensive ROS was negative.  Exam:   BP 118/78 mmHg  Pulse 68  Resp 16  Ht 5' 4.75" (1.645 m)  Wt 198 lb 9.6 oz (90.084 kg)  BMI 33.29 kg/m2  LMP 03/17/2014 Height: 5' 4.75" (164.5 cm)  Ht Readings from Last 3 Encounters:  04/22/14 5' 4.75" (1.645 m)  04/11/13 5' 5.5" (1.664 m)    General appearance: alert, cooperative and appears stated age Head: Normocephalic, without obvious abnormality, atraumatic Neck: no adenopathy, supple, symmetrical, trachea midline and thyroid normal to inspection and palpation Lungs: clear to auscultation bilaterally Breasts: normal appearance, no masses or tenderness, No nipple retraction or dimpling, No nipple discharge or bleeding, No axillary or supraclavicular adenopathy Heart: regular rate and rhythm Abdomen: soft, non-tender; no masses,  no organomegaly Extremities: extremities normal, atraumatic, no cyanosis or edema Skin: Skin color, texture, turgor normal. No rashes or lesions Lymph nodes: Cervical, supraclavicular, and axillary nodes normal. No abnormal inguinal nodes palpated Neurologic: Grossly normal   Pelvic: External genitalia:  no lesions,  scaling with exudate wet prep taken              Urethra:  normal appearing urethra with no masses, tenderness or lesions              Bartholin's and Skene's: normal                 Vagina: normal appearing vagina with normal color and white thick discharge, no lesions wet prep taken, ph 4.0              Cervix: normal , non tender, no lesions              Pap taken: No. Bimanual Exam:  Uterus:  normal size, contour, position,  consistency, mobility, non-tender and anteverted              Adnexa: normal adnexa and no mass, fullness, tenderness               Rectovaginal: Confirms               Anus:  normal sphincter tone, no lesion  Wet prep: positive yeast   A:  Well Woman with normal exam  Contraception OCP desired  STD screening  Fatigue ? Related to asthma  History of asthma with stable medication  P:   Reviewed health and wellness pertinent to exam  Rx Seasonique see order  Lab: Gc, Chlamydia, STD panel  Labs: CBC with diff, TSH, Vitamin D  Pap smear not taken today   counseled on breast self exam, STD prevention, HIV risk factors and prevention, use and side effects of OCP's, adequate intake of calcium and vitamin D, diet and exercise  return annually or prn  An After Visit Summary was printed and given to the patient.

## 2014-04-23 LAB — TSH: TSH: 1.185 u[IU]/mL (ref 0.350–4.500)

## 2014-04-23 LAB — CBC WITH DIFFERENTIAL/PLATELET
BASOS ABS: 0 10*3/uL (ref 0.0–0.1)
BASOS PCT: 0 % (ref 0–1)
EOS ABS: 0.2 10*3/uL (ref 0.0–0.7)
Eosinophils Relative: 2 % (ref 0–5)
HCT: 39.4 % (ref 36.0–46.0)
Hemoglobin: 14.2 g/dL (ref 12.0–15.0)
Lymphocytes Relative: 30 % (ref 12–46)
Lymphs Abs: 3 10*3/uL (ref 0.7–4.0)
MCH: 30.9 pg (ref 26.0–34.0)
MCHC: 36 g/dL (ref 30.0–36.0)
MCV: 85.7 fL (ref 78.0–100.0)
MONOS PCT: 6 % (ref 3–12)
MPV: 9.2 fL — ABNORMAL LOW (ref 9.4–12.4)
Monocytes Absolute: 0.6 10*3/uL (ref 0.1–1.0)
Neutro Abs: 6.1 10*3/uL (ref 1.7–7.7)
Neutrophils Relative %: 62 % (ref 43–77)
PLATELETS: 273 10*3/uL (ref 150–400)
RBC: 4.6 MIL/uL (ref 3.87–5.11)
RDW: 12.8 % (ref 11.5–15.5)
WBC: 9.9 10*3/uL (ref 4.0–10.5)

## 2014-04-23 LAB — STD PANEL
HEP B S AG: NEGATIVE
HIV: NONREACTIVE

## 2014-04-23 LAB — VITAMIN D 25 HYDROXY (VIT D DEFICIENCY, FRACTURES): VIT D 25 HYDROXY: 32 ng/mL (ref 30–100)

## 2014-04-29 LAB — IPS N GONORRHOEA AND CHLAMYDIA BY PCR

## 2014-04-30 NOTE — Progress Notes (Signed)
Reviewed personally.  M. Suzanne Sudiksha Victor, MD.  

## 2014-05-05 ENCOUNTER — Other Ambulatory Visit: Payer: Self-pay | Admitting: Certified Nurse Midwife

## 2014-05-23 HISTORY — PX: KNEE SURGERY: SHX244

## 2015-04-24 ENCOUNTER — Other Ambulatory Visit (HOSPITAL_COMMUNITY): Payer: Self-pay | Admitting: Specialist

## 2015-04-24 ENCOUNTER — Ambulatory Visit (HOSPITAL_COMMUNITY)
Admission: RE | Admit: 2015-04-24 | Discharge: 2015-04-24 | Disposition: A | Discharge status: Home / Self Care | Payer: PRIVATE HEALTH INSURANCE | Source: Ambulatory Visit | Attending: Cardiology | Admitting: Cardiology

## 2015-04-24 DIAGNOSIS — R609 Edema, unspecified: Secondary | ICD-10-CM | POA: Diagnosis not present

## 2015-04-30 ENCOUNTER — Encounter: Payer: Self-pay | Admitting: Certified Nurse Midwife

## 2015-04-30 ENCOUNTER — Ambulatory Visit (INDEPENDENT_AMBULATORY_CARE_PROVIDER_SITE_OTHER): Payer: PRIVATE HEALTH INSURANCE | Admitting: Certified Nurse Midwife

## 2015-04-30 VITALS — BP 120/78 | HR 70 | Resp 16 | Ht 64.75 in | Wt 222.0 lb

## 2015-04-30 DIAGNOSIS — Z01419 Encounter for gynecological examination (general) (routine) without abnormal findings: Secondary | ICD-10-CM | POA: Diagnosis not present

## 2015-04-30 DIAGNOSIS — Z124 Encounter for screening for malignant neoplasm of cervix: Secondary | ICD-10-CM | POA: Diagnosis not present

## 2015-04-30 DIAGNOSIS — Z0000 Encounter for general adult medical examination without abnormal findings: Secondary | ICD-10-CM | POA: Diagnosis not present

## 2015-04-30 DIAGNOSIS — Z3041 Encounter for surveillance of contraceptive pills: Secondary | ICD-10-CM

## 2015-04-30 LAB — POCT URINALYSIS DIPSTICK
Bilirubin, UA: NEGATIVE
Blood, UA: NEGATIVE
Glucose, UA: NEGATIVE
Ketones, UA: NEGATIVE
LEUKOCYTES UA: NEGATIVE
Nitrite, UA: NEGATIVE
PH UA: 5
PROTEIN UA: NEGATIVE
UROBILINOGEN UA: NEGATIVE

## 2015-04-30 MED ORDER — LEVONORGEST-ETH ESTRAD 91-DAY 0.15-0.03 &0.01 MG PO TABS: 1.0000 | ORAL_TABLET | Freq: Every day | ORAL | Status: DC

## 2015-04-30 NOTE — Progress Notes (Signed)
29 y.o. G0P0000 Single  Caucasian Fe here for annual exam. Periods normal, no issues. Had knee injury related to weight lifting, recent surgery 11/16, still under follow up with Orthopedic. Contraception working well, desires continuance. New partner STD screening desired. Vitamin D has increased energy and decreased fatigue. Sees Urgent care if needed and sees Asthma specialist for medication management. No other health issues today.  Patient's last menstrual period was 03/21/2015.          Sexually active: Yes.    The current method of family planning is OCP (estrogen/progesterone).    Exercising: Yes.    running, biking, weights, not in last few weeks due to knee surgery Smoker:  no  Health Maintenance: Pap:  04-11-13 neg MMG:  none Colonoscopy:  none BMD:   none TDaP:  2007 pt had reaction of swelling & fever Labs: poct urine-neg Self breast exam: done occ   reports that she has never smoked. She has never used smokeless tobacco. She reports that she drinks alcohol. She reports that she does not use illicit drugs.  Past Medical History  Diagnosis Date  . Migraines     no aura  . UTI (lower urinary tract infection)     post coital  . Asthma     exercise induced    History reviewed. No pertinent past surgical history.  Current Outpatient Prescriptions  Medication Sig Dispense Refill  . ADVAIR DISKUS 250-50 MCG/DOSE AEPB INHALE 1 INHALATION INTO THE LUNGS EVERY 12 (TWELVE) HOURS.  12  . albuterol (PROVENTIL HFA;VENTOLIN HFA) 108 (90 BASE) MCG/ACT inhaler Inhale into the lungs every 6 (six) hours as needed for wheezing or shortness of breath.    . Ascorbic Acid (VITAMIN C PO) Take by mouth daily.    Marland Kitchen azelastine (ASTELIN) 0.1 % nasal spray Place into both nostrils 2 (two) times daily. Use in each nostril as directed    . azelastine (OPTIVAR) 0.05 % ophthalmic solution INSTILL 1 DROP IN THE BOTH EYES TWICE DAILY  12  . budesonide (PULMICORT) 0.5 MG/2ML nebulizer solution USE 2  MLS (0.5 MG TOTAL) BY NEBULIZATION 2 (TWO) TIMES DAILY. NASAL RINSE    . Cholecalciferol (VITAMIN D3) 1000 UNITS CAPS Take by mouth.    Drusilla Kanner CONCENTRATE PO Take by mouth. c-bacillus coag 250-30-50    . Levonorgestrel-Ethinyl Estradiol (SEASONIQUE) 0.15-0.03 &0.01 MG tablet Take 1 tablet by mouth daily. 1 Package 12  . naproxen (NAPROSYN) 500 MG tablet Take by mouth.     No current facility-administered medications for this visit.    Family History  Problem Relation Age of Onset  . Hypertension Mother   . Heart disease Father     ROS:  Pertinent items are noted in HPI.  Otherwise, a comprehensive ROS was negative.  Exam:   BP 120/78 mmHg  Pulse 70  Resp 16  Ht 5' 4.75" (1.645 m)  Wt 222 lb (100.699 kg)  BMI 37.21 kg/m2  LMP 03/21/2015 Height: 5' 4.75" (164.5 cm) Ht Readings from Last 3 Encounters:  04/30/15 5' 4.75" (1.645 m)  04/22/14 5' 4.75" (1.645 m)  04/11/13 5' 5.5" (1.664 m)    General appearance: alert, cooperative and appears stated age Head: Normocephalic, without obvious abnormality, atraumatic Neck: no adenopathy, supple, symmetrical, trachea midline and thyroid normal to inspection and palpation Lungs: clear to auscultation bilaterally Breasts: normal appearance, no masses or tenderness, No nipple retraction or dimpling, No nipple discharge or bleeding, No axillary or supraclavicular adenopathy Heart: regular rate and rhythm Abdomen:  soft, non-tender; no masses,  no organomegaly Extremities: extremities normal, atraumatic, no cyanosis or edema Skin: Skin color, texture, turgor normal. No rashes or lesions Lymph nodes: Cervical, supraclavicular, and axillary nodes normal. No abnormal inguinal nodes palpated Neurologic: Grossly normal   Pelvic: External genitalia:  no lesions              Urethra:  normal appearing urethra with no masses, tenderness or lesions              Bartholin's and Skene's: normal                 Vagina: normal appearing vagina  with normal color and discharge, no lesions              Cervix: normal,non tender,no lesions              Pap taken: Yes.   Bimanual Exam:  Uterus:  normal size, contour, position, consistency, mobility, non-tender              Adnexa: normal adnexa and no mass, fullness, tenderness               Rectovaginal: Confirms               Anus:  normal sphincter tone, no lesions  Chaperone present: yes   A:  Well Woman with normal exam  Contraception OCP desired  STD screening  History of Vitamin D deficiency  Recent left knee surgery in follow up with orthopedic  P:   Reviewed health and wellness pertinent to exam  Rx Seasonique see order  Lab: GC,Chlamydia, HIV,RPR, Affirm  Lab: Vitamin D  Continue follow up as indicated  Pap smear as above with HPVHR    counseled on breast self exam, STD prevention, HIV risk factors and prevention, use and side effects of OCP's, adequate intake of calcium and vitamin D, diet and exercise  return annually or prn  An After Visit Summary was printed and given to the patient.

## 2015-04-30 NOTE — Patient Instructions (Signed)

## 2015-05-01 LAB — WET PREP BY MOLECULAR PROBE
Candida species: NEGATIVE
GARDNERELLA VAGINALIS: NEGATIVE
TRICHOMONAS VAG: NEGATIVE

## 2015-05-01 LAB — STD PANEL
HEP B S AG: NEGATIVE
HIV: NONREACTIVE

## 2015-05-01 LAB — VITAMIN D 25 HYDROXY (VIT D DEFICIENCY, FRACTURES): Vit D, 25-Hydroxy: 32 ng/mL (ref 30–100)

## 2015-05-04 LAB — IPS N GONORRHOEA AND CHLAMYDIA BY PCR

## 2015-05-05 LAB — IPS PAP TEST WITH HPV

## 2015-05-06 NOTE — Progress Notes (Signed)
Reviewed personally.  M. Suzanne Layman Gully, MD.  

## 2015-05-14 ENCOUNTER — Other Ambulatory Visit: Payer: Self-pay | Admitting: Certified Nurse Midwife

## 2015-05-14 NOTE — Telephone Encounter (Signed)
Created in Error//kg

## 2015-08-07 ENCOUNTER — Telehealth: Payer: Self-pay | Admitting: Certified Nurse Midwife

## 2015-08-07 NOTE — Telephone Encounter (Signed)
Misty Combs please review chart and make recommendations for change of pill.

## 2015-08-07 NOTE — Telephone Encounter (Signed)
No other recommendations today and will await Ms. Debbie's review of chart and change of OCP.

## 2015-08-07 NOTE — Telephone Encounter (Signed)
Patient wanting to talk with nurse about BTB that she is having on Seasonique. Best # to reach: [phone]

## 2015-08-07 NOTE — Telephone Encounter (Signed)
Spoke with patient. She has been on [place] for at least 3 years. She has been experiencing break through bleeding in the middle of her packs.  She states she discussed this with Melvia Heaps CNM at last office and was advised to call back if this occurred again. She states she has been having light bleeding for two days and is currently taking active pills in the last week of the second month. Denies heavy bleeding. Changing her pad q 6 hours.  No missed or late pills. Denies STD concerns, no partner change since last testing. No recent illness or start of activity or diet change.   She is advised to take an OTC home pregnancy test to rule out pregnancy and advised to call back if positive. Patient agreeable.   She would like to change her birth control pill type. She declines an office visit at this time.  She would like Debbi to review and if necessary will come in for office visit. Advised Melvia Heaps CNM is out of the office but will send message to her to review when back in office.  Advised will send to covering provider and if any new instructions at this time will return call. Patient is agreeable.   Routing to Cisco CNM and Kem Boroughs, FNP as covering.

## 2015-08-11 MED ORDER — NORETHIN ACE-ETH ESTRAD-FE 1-20 MG-MCG(24) PO TABS: 1.0000 | ORAL_TABLET | Freq: Every day | ORAL | Status: DC

## 2015-08-11 NOTE — Telephone Encounter (Signed)
Spoke with patient. Advised of message as seen below from [place]. She is agreeable and verbalizes understanding of how to start new OCP. Rx for Loestrin 24 Fe #3 0RF sent to pharmacy on file. 3 month recheck scheduled for 11/10/2015 at 8:45 am with Melvia Heaps CNM.  Routing to provider for final review. Patient agreeable to disposition. Will close encounter.

## 2015-08-11 NOTE — Telephone Encounter (Signed)
She was seen 12/16. Feel she should be switched to Loestrin 24 Fe. I would suggest stopping pills and with period start Loestrin 24 Fe, schedule 3 month follow up appt. After starting new OCP

## 2015-10-26 ENCOUNTER — Other Ambulatory Visit: Payer: Self-pay | Admitting: Certified Nurse Midwife

## 2015-10-26 MED ORDER — NORETHIN ACE-ETH ESTRAD-FE 1-20 MG-MCG(24) PO TABS: 1.0000 | ORAL_TABLET | Freq: Every day | ORAL | Status: DC

## 2015-10-26 NOTE — Telephone Encounter (Signed)
Medication refill request: LOESTRIN 24 FE 1-20 MG-MCG Last AEX:  04/30/15 DL Next AEX: 05/03/16; Office Visit 11/10/15 w/ DL Last MMG (if hormonal medication request): n/a Refill authorized: 08/11/15 #3packs w/0 refills; today #1pack w/0 refills?

## 2015-10-30 ENCOUNTER — Other Ambulatory Visit: Payer: Self-pay | Admitting: Certified Nurse Midwife

## 2015-11-10 ENCOUNTER — Ambulatory Visit (INDEPENDENT_AMBULATORY_CARE_PROVIDER_SITE_OTHER): Payer: PRIVATE HEALTH INSURANCE | Admitting: Certified Nurse Midwife

## 2015-11-10 ENCOUNTER — Encounter: Payer: Self-pay | Admitting: Certified Nurse Midwife

## 2015-11-10 VITALS — BP 112/72 | HR 70 | Resp 16 | Ht 64.75 in | Wt 204.0 lb

## 2015-11-10 DIAGNOSIS — Z3041 Encounter for surveillance of contraceptive pills: Secondary | ICD-10-CM | POA: Diagnosis not present

## 2015-11-10 NOTE — Patient Instructions (Signed)

## 2015-11-10 NOTE — Progress Notes (Signed)
30 y.o. Single Caucasian G0P0000here for evaluation of Loestrin 24 Fe initiated on 08/23/15 for contraception. She transitioned from [place]. Finished a heavy period prior to starting Loestrin 24 Fe. So she did not anticipate a period yet. Patient taking medication as prescribed. Denies missed pills, headaches, nausea, DVT warning signs or symptoms,  breakthrough bleeding, or other changes. Negative UPT at home.  Keeping menses calendar. Getting married in 04/30/16 and plans pregnancy shortly afterward. ?anything she should prepare for now. Plans to stop OCP after 6 months of marriage. Has lost 38 pounds with exercise and diet to prepare for pregnancy and plans to continue until normal body weight.No other health issues today.  O: Healthy female, WD WN Affect: normal orientation X 3    A: Contraception follow up of Loestrin 24 Fe normal surveillance with amenorrhea Planning pregnancy after marriage in 12/17   P: Continue Loestrin 24 Fe as prescribed.  Discussed amenorrhea is not unusual after changing from 3 month continuous. Patient to advise if no menses in the next month. Discussed Rubella screening today, so if not immune can re- immunize while still on contraception. Given planning for pregnancy pamphlet and encouraged to just read at this point and can address any questions at her aex after marriage. Encouraged to start on prenatal vitamins now to increase folic acid. Questions addressed. Lab: Rubella  20 minutes spent with patient in face to face counseling.  RV prn, aex

## 2015-11-11 LAB — RUBELLA SCREEN: Rubella: 6.14 Index — ABNORMAL HIGH (ref <0.90)

## 2015-11-11 NOTE — Progress Notes (Signed)
Reviewed personally.  M. Suzanne Riki Berninger, MD.  

## 2015-11-20 ENCOUNTER — Other Ambulatory Visit: Payer: Self-pay | Admitting: Nurse Practitioner

## 2015-11-20 NOTE — Telephone Encounter (Signed)
Medication refill request: Blisovi 24 Fe Last AEX:  04/30/15 DL Next AEX: 05/03/16 Last MMG (if hormonal medication request): n/a Refill authorized: Norethindrone Acetate-Ethinyl Estrad-Fe 10/26/15. #1Package 0R. Please advise. Thank you.

## 2016-02-11 ENCOUNTER — Other Ambulatory Visit: Payer: Self-pay | Admitting: *Deleted

## 2016-04-28 ENCOUNTER — Encounter: Payer: Self-pay | Admitting: Certified Nurse Midwife

## 2016-04-28 ENCOUNTER — Ambulatory Visit: Payer: PRIVATE HEALTH INSURANCE | Admitting: Certified Nurse Midwife

## 2016-04-28 VITALS — BP 110/78 | HR 80 | Resp 20 | Ht 64.5 in | Wt 210.0 lb

## 2016-04-28 DIAGNOSIS — Z01419 Encounter for gynecological examination (general) (routine) without abnormal findings: Secondary | ICD-10-CM | POA: Diagnosis not present

## 2016-04-28 LAB — POCT URINALYSIS DIPSTICK
BILIRUBIN UA: NEGATIVE
Blood, UA: NEGATIVE
GLUCOSE UA: NEGATIVE
KETONES UA: NEGATIVE
LEUKOCYTES UA: NEGATIVE
Nitrite, UA: NEGATIVE
PROTEIN UA: NEGATIVE
Urobilinogen, UA: NEGATIVE
pH, UA: 5

## 2016-04-28 NOTE — Patient Instructions (Signed)

## 2016-04-28 NOTE — Progress Notes (Signed)
Encounter reviewed Isaac Lacson, MD   

## 2016-04-28 NOTE — Progress Notes (Signed)
30 y.o. G0P0000 Single  Caucasian Fe here for annual exam. Periods none with OCP. Contraception working well. Getting married tomorrow. No warning signs with OCP use or missed pills. Getting Married in two days and is suppose to snow, so slightly worried. Plans to try for pregnancy after 3 months of marriage. Had Rubella titer which was good, on prenatal vitamins now. No other health issues today.  No LMP recorded. Patient is not currently having periods (Reason: Oral contraceptives).          Sexually active: Yes.    The current method of family planning is OCP (estrogen/progesterone).    Exercising: Yes.    walking Smoker:  no  Health Maintenance: Pap:  04-30-15 neg HPV HR neg MMG:  none Colonoscopy:  none BMD:   none TDaP:  2007, declines further (pt had reaction of fever & swelling) Shingles: no Pneumonia: 2/17 Hep C and HIV: HIV neg 2016 Labs: poct urine-neg Self breast exam: done monthly   reports that she has never smoked. She has never used smokeless tobacco. She reports that she does not drink alcohol or use drugs.  Past Medical History:  Diagnosis Date  . Asthma    exercise induced  . Migraines    no aura  . UTI (lower urinary tract infection)    post coital    Past Surgical History:  Procedure Laterality Date  . KNEE SURGERY Left 2016    Current Outpatient Prescriptions  Medication Sig Dispense Refill  . ADVAIR DISKUS 250-50 MCG/DOSE AEPB INHALE 1 INHALATION INTO THE LUNGS EVERY 12 (TWELVE) HOURS.  12  . albuterol (PROVENTIL HFA;VENTOLIN HFA) 108 (90 BASE) MCG/ACT inhaler Inhale into the lungs every 6 (six) hours as needed for wheezing or shortness of breath.    Marland Kitchen azelastine (ASTELIN) 0.1 % nasal spray Place into both nostrils 2 (two) times daily. Use in each nostril as directed    . azelastine (OPTIVAR) 0.05 % ophthalmic solution INSTILL 1 DROP IN THE BOTH EYES TWICE DAILY  12  . BLISOVI 24 FE 1-20 MG-MCG(24) tablet TAKE 1 TABLET BY MOUTH DAILY. 3 Package 1  .  budesonide (PULMICORT) 0.5 MG/2ML nebulizer solution USE 2 MLS (0.5 MG TOTAL) BY NEBULIZATION 2 (TWO) TIMES DAILY. NASAL RINSE    . Cholecalciferol (VITAMIN D3) 1000 UNITS CAPS Take by mouth.    Drusilla Kanner CONCENTRATE PO Take by mouth. c-bacillus coag 250-30-50    . MAGNESIUM PO Take by mouth daily.    . Prenatal Vit-Fe Fumarate-FA (PRENATAL VITAMIN PO) Take by mouth daily.     No current facility-administered medications for this visit.     Family History  Problem Relation Age of Onset  . Hypertension Mother   . Heart disease Father     ROS:  Pertinent items are noted in HPI.  Otherwise, a comprehensive ROS was negative.  Exam:   BP 110/78   Pulse 80   Resp 20   Ht 5' 4.5" (1.638 m)   Wt 210 lb (95.3 kg)   BMI 35.49 kg/m  Height: 5' 4.5" (163.8 cm) Ht Readings from Last 3 Encounters:  04/28/16 5' 4.5" (1.638 m)  11/10/15 5' 4.75" (1.645 m)  04/30/15 5' 4.75" (1.645 m)    General appearance: alert, cooperative and appears stated age Head: Normocephalic, without obvious abnormality, atraumatic Neck: no adenopathy, supple, symmetrical, trachea midline and thyroid normal to inspection and palpation Lungs: clear to auscultation bilaterally Breasts: normal appearance, no masses or tenderness, No nipple retraction or dimpling, No  nipple discharge or bleeding, No axillary or supraclavicular adenopathy Heart: regular rate and rhythm Abdomen: soft, non-tender; no masses,  no organomegaly Extremities: extremities normal, atraumatic, no cyanosis or edema Skin: Skin color, texture, turgor normal. No rashes or lesions Lymph nodes: Cervical, supraclavicular, and axillary nodes normal. No abnormal inguinal nodes palpated Neurologic: Grossly normal   Pelvic: External genitalia:  no lesions              Urethra:  normal appearing urethra with no masses, tenderness or lesions              Bartholin's and Skene's: normal                 Vagina: normal appearing vagina with normal color  and discharge, no lesions              Cervix: no cervical motion tenderness, no lesions and nulliparous appearance              Pap taken: No. Bimanual Exam:  Uterus:  normal size, contour, position, consistency, mobility, non-tender              Adnexa: normal adnexa and no mass, fullness, tenderness               Rectovaginal: Confirms               Anus:  normal sphincter tone, no lesions  Chaperone present: yes  A:  Well Woman with normal exam  Contraception none desired  Condoms planned after finishes this pack  P:   Reviewed health and wellness pertinent to exam  Continue prenatal vitamins and call when positive pregnancy test or no pregnancy in 6 months.  Pap smear as above not taken  counseled on breast self exam, adequate intake of calcium and vitamin D, diet and exercise  return annually or prn  An After Visit Summary was printed and given to the patient.

## 2016-05-03 ENCOUNTER — Ambulatory Visit: Payer: PRIVATE HEALTH INSURANCE | Admitting: Certified Nurse Midwife

## 2016-05-03 ENCOUNTER — Other Ambulatory Visit: Payer: Self-pay | Admitting: Nurse Practitioner

## 2016-05-03 NOTE — Telephone Encounter (Signed)
Medication refill request: Blisovi Last AEX:  04/28/16 DL Next AEX: 05/02/17 DL Last MMG (if hormonal medication request): n/a Refill authorized: 11/23/15 #3 Packages 1R. Please advise. Thank you.

## 2016-11-29 ENCOUNTER — Ambulatory Visit (INDEPENDENT_AMBULATORY_CARE_PROVIDER_SITE_OTHER): Payer: Commercial Managed Care - PPO | Admitting: Certified Nurse Midwife

## 2016-11-29 ENCOUNTER — Encounter: Payer: Self-pay | Admitting: Certified Nurse Midwife

## 2016-11-29 ENCOUNTER — Other Ambulatory Visit (HOSPITAL_COMMUNITY)
Admission: RE | Admit: 2016-11-29 | Discharge: 2016-11-29 | Disposition: A | Discharge status: Home / Self Care | Payer: Commercial Managed Care - PPO | Source: Ambulatory Visit | Attending: Certified Nurse Midwife | Admitting: Certified Nurse Midwife

## 2016-11-29 VITALS — BP 110/64 | HR 70 | Resp 16 | Ht 64.5 in | Wt 215.0 lb

## 2016-11-29 DIAGNOSIS — L732 Hidradenitis suppurativa: Secondary | ICD-10-CM | POA: Diagnosis not present

## 2016-11-29 DIAGNOSIS — N898 Other specified noninflammatory disorders of vagina: Secondary | ICD-10-CM | POA: Insufficient documentation

## 2016-11-29 NOTE — Patient Instructions (Signed)
Hidradenitis Suppurativa Hidradenitis suppurativa is a long-term (chronic) skin disease that starts with blocked sweat glands or hair follicles. Bacteria may grow in these blocked openings of your skin. Hidradenitis suppurativa is like a severe form of acne that develops in areas of your body where acne would be unusual. It is most likely to affect the areas of your body where skin rubs against skin and becomes moist. This includes your:  Underarms.  Groin.  Genital areas.  Buttocks.  Upper thighs.  Breasts.  Hidradenitis suppurativa may start out with small pimples. The pimples can develop into deep sores that break open (rupture) and drain pus. Over time your skin may thicken and become scarred. Hidradenitis suppurativa cannot be passed from person to person. What are the causes? The exact cause of hidradenitis suppurativa is not known. This condition may be due to:  Female and female hormones. The condition is rare before and after puberty.  An overactive body defense system (immune system). Your immune system may overreact to the blocked hair follicles or sweat glands and cause swelling and pus-filled sores.  What increases the risk? You may have a higher risk of hidradenitis suppurativa if you:  Are a woman.  Are between ages 11 and 55.  Have a family history of hidradenitis suppurativa.  Have a personal history of acne.  Are overweight.  Smoke.  Take the drug lithium.  What are the signs or symptoms? The first signs of an outbreak are usually painful skin bumps that look like pimples. As the condition progresses:  Skin bumps may get bigger and grow deeper into the skin.  Bumps under the skin may rupture and drain smelly pus.  Skin may become itchy and infected.  Skin may thicken and scar.  Drainage may continue through tunnels under the skin (fistulas).  Walking and moving your arms can become painful.  How is this diagnosed? Your health care provider may  diagnose hidradenitis suppurativa based on your medical history and your signs and symptoms. A physical exam will also be done. You may need to see a health care provider who specializes in skin diseases (dermatologist). You may also have tests done to confirm the diagnosis. These can include:  Swabbing a sample of pus or drainage from your skin so it can be sent to the lab and tested for infection.  Blood tests to check for infection.  How is this treated? The same treatment will not work for everybody with hidradenitis suppurativa. Your treatment will depend on how severe your symptoms are. You may need to try several treatments to find what works best for you. Part of your treatment may include cleaning and bandaging (dressing) your wounds. You may also have to take medicines, such as the following:  Antibiotics.  Acne medicines.  Medicines to block or suppress the immune system.  A diabetes medicine (metformin) is sometimes used to treat this condition.  For women, birth control pills can sometimes help relieve symptoms.  You may need surgery if you have a severe case of hidradenitis suppurativa that does not respond to medicine. Surgery may involve:  Using a laser to clear the skin and remove hair follicles.  Opening and draining deep sores.  Removing the areas of skin that are diseased and scarred.  Follow these instructions at home:  Learn as much as you can about your disease, and work closely with your health care providers.  Take medicines only as directed by your health care provider.  If you were prescribed   an antibiotic medicine, finish it all even if you start to feel better.  If you are overweight, losing weight may be very helpful. Try to reach and maintain a healthy weight.  Do not use any tobacco products, including cigarettes, chewing tobacco, or electronic cigarettes. If you need help quitting, ask your health care provider.  Do not shave the areas where you  get hidradenitis suppurativa.  Do not wear deodorant.  Wear loose-fitting clothes.  Try not to overheat and get sweaty.  Take a daily bleach bath as directed by your health care provider. ? Fill your bathtub halfway with water. ? Pour in  cup of unscented household bleach. ? Soak for 5-10 minutes.  Cover sore areas with a warm, clean washcloth (compress) for 5-10 minutes. Contact a health care provider if:  You have a flare-up of hidradenitis suppurativa.  You have chills or a fever.  You are having trouble controlling your symptoms at home. This information is not intended to replace advice given to you by your health care provider. Make sure you discuss any questions you have with your health care provider. Document Released: 12/22/2003 Document Revised: 10/15/2015 Document Reviewed: 08/09/2013 Elsevier Interactive Patient Education  2018 [employer]  

## 2016-11-29 NOTE — Progress Notes (Signed)
Subjective:     Patient ID: Misty Combs, female   DOB: [birth year], 31 y.o.   MRN: [id number]  HPI 31 yo g0p0 married white female here with complaint of ? Boils in vaginal area and in groin area. Some itching in area. Has had them in axillary area also, but not in a long time Has had since childhood and usually clear up on their own, but not this time. Had 5 areas on vulva some have cleared but others have reoccurred. No fever or chills. ? Ingrown hairs vs boils. Some ? pus noted prior to healing. No other areas. Does not shave in area only clips as needed. Has not soaked in tub bath for treatment or used any medication. No history of MRSA that she is aware of. Has  Noted increased vaginal discharge with slight odor, no itching or burning. No new personal products. No STD concerns. No other health issues today.   Review of Systems  Constitutional: Negative.   Genitourinary: Positive for genital sores and vaginal discharge. Negative for pelvic pain, vaginal bleeding and vaginal pain.  Skin: Negative for rash.       Objective:   Physical Exam  Constitutional: She is oriented to person, place, and time. She appears well-developed and well-nourished.  Genitourinary: Rectum normal and uterus normal.    There is no rash or tenderness on the right labia. There is no rash or tenderness on the left labia. Cervix exhibits discharge. Cervix exhibits no motion tenderness and no friability. Right adnexum displays no mass, no tenderness and no fullness. Left adnexum displays no mass, no tenderness and no fullness. No bleeding in the vagina. Vaginal discharge found.  Lymphadenopathy:       Right: No inguinal adenopathy present.       Left: No inguinal adenopathy present.  Neurological: She is alert and oriented to person, place, and time.  Skin: Skin is warm and dry.  Psychiatric: She has a normal mood and affect. Her behavior is normal.   Affirm taken from vagina.     Assessment:       Normal Pelvic exam R/O MRSA from vulva area Suspect hidradenitis due to history description    Plan:     Reviewed findings with patient and discussed etiology fo MRSA and will treat if culture positive. Discussed suspect history of hidradenitis due to description and chronic occurrence. Questions addressed and written information given on. Instructed to not squeeze any new areas if occurs, none seen, use epsom salt bath daily to help with resolution, but if increasing numbers of areas needs to come in to evaluate. Patient voiced understanding. Lab: MRSA culture Affirm will treat if indicated.  Rv prn

## 2016-11-30 ENCOUNTER — Encounter: Payer: Self-pay | Admitting: Certified Nurse Midwife

## 2016-12-01 ENCOUNTER — Telehealth: Payer: Self-pay

## 2016-12-01 LAB — MRSA CULTURE: MRSA Screen: NEGATIVE

## 2016-12-01 LAB — CERVICOVAGINAL ANCILLARY ONLY
BACTERIAL VAGINITIS: NEGATIVE
CANDIDA VAGINITIS: NEGATIVE
Trichomonas: NEGATIVE

## 2016-12-01 NOTE — Telephone Encounter (Signed)
-----   Message from Regina Eck, CNM sent at 12/01/2016 12:59 PM EDT ----- Notify patient that her MRSA screen was negative. Vaginal screen was negative for BV, Yeast and trich. Patient status?

## 2016-12-01 NOTE — Telephone Encounter (Signed)
lmtcb

## 2016-12-01 NOTE — Telephone Encounter (Signed)
Patient notified of results. See lab 

## 2017-05-02 ENCOUNTER — Ambulatory Visit: Payer: Commercial Managed Care - PPO | Admitting: Certified Nurse Midwife

## 2017-05-02 ENCOUNTER — Ambulatory Visit: Payer: PRIVATE HEALTH INSURANCE | Admitting: Certified Nurse Midwife

## 2017-05-02 NOTE — Progress Notes (Deleted)
31 y.o. G0P0000 Married  {Race/ethnicity:17218} Fe here for annual exam.    No LMP recorded. Patient is not currently having periods (Reason: Oral contraceptives).          Sexually active: {yes no:314532}  The current method of family planning is {contraception:315051}.    Exercising: {yes no:314532}  {types:19826} Smoker:  {YES NO:22349}  Health Maintenance: Pap:  04-30-15 neg HPV HR neg History of Abnormal Pap: {YES NO:22349} MMG:  none Self Breast exams: {YES NO:22349} Colonoscopy:  none BMD:   none TDaP:  2007,Declines further, pt had reaction of fever & swellint Shingles: no Pneumonia: no Hep C and HIV: *HIV neg 2016 Labs: ***   reports that  has never smoked. she has never used smokeless tobacco. She reports that she does not drink alcohol or use drugs.  Past Medical History:  Diagnosis Date  . Asthma    exercise induced  . Migraines    no aura  . UTI (lower urinary tract infection)    post coital    Past Surgical History:  Procedure Laterality Date  . KNEE SURGERY Left 2016    Current Outpatient Medications  Medication Sig Dispense Refill  . albuterol (PROVENTIL HFA;VENTOLIN HFA) 108 (90 BASE) MCG/ACT inhaler Inhale into the lungs every 6 (six) hours as needed for wheezing or shortness of breath.    Marland Kitchen azelastine (ASTELIN) 0.1 % nasal spray Place into both nostrils 2 (two) times daily. Use in each nostril as directed    . azelastine (OPTIVAR) 0.05 % ophthalmic solution INSTILL 1 DROP IN THE BOTH EYES TWICE DAILY  12  . budesonide (PULMICORT) 0.5 MG/2ML nebulizer solution USE 2 MLS (0.5 MG TOTAL) BY NEBULIZATION 2 (TWO) TIMES DAILY. NASAL RINSE    . CRANBERRY CONCENTRATE PO Take by mouth. c-bacillus coag 250-30-50    . fluticasone furoate-vilanterol (BREO ELLIPTA) 100-25 MCG/INH AEPB     . Prenatal Vit-Fe Fumarate-FA (PRENATAL VITAMIN PO) Take by mouth daily.     No current facility-administered medications for this visit.     Family History  Problem Relation  Age of Onset  . Hypertension Mother   . Heart disease Father     ROS:  Pertinent items are noted in HPI.  Otherwise, a comprehensive ROS was negative.  Exam:   There were no vitals taken for this visit.   Ht Readings from Last 3 Encounters:  11/29/16 5' 4.5" (1.638 m)  04/28/16 5' 4.5" (1.638 m)  11/10/15 5' 4.75" (1.645 m)    General appearance: alert, cooperative and appears stated age Head: Normocephalic, without obvious abnormality, atraumatic Neck: no adenopathy, supple, symmetrical, trachea midline and thyroid {EXAM; THYROID:18604} Lungs: clear to auscultation bilaterally Breasts: {Exam; breast:13139::"normal appearance, no masses or tenderness"} Heart: regular rate and rhythm Abdomen: soft, non-tender; no masses,  no organomegaly Extremities: extremities normal, atraumatic, no cyanosis or edema Skin: Skin color, texture, turgor normal. No rashes or lesions Lymph nodes: Cervical, supraclavicular, and axillary nodes normal. No abnormal inguinal nodes palpated Neurologic: Grossly normal   Pelvic: External genitalia:  no lesions              Urethra:  normal appearing urethra with no masses, tenderness or lesions              Bartholin's and Skene's: normal                 Vagina: normal appearing vagina with normal color and discharge, no lesions  Cervix: {exam; cervix:14595}              Pap taken: {yes no:314532} Bimanual Exam:  Uterus:  {exam; uterus:12215}              Adnexa: {exam; adnexa:12223}               Rectovaginal: Confirms               Anus:  normal sphincter tone, no lesions  Chaperone present: ***  A:  Well Woman with normal exam  P:   Reviewed health and wellness pertinent to exam  Pap smear: {YES NO:22349}  {plan; gyn:5269::"mammogram","pap smear","return annually or prn"}  An After Visit Summary was printed and given to the patient.

## 2017-05-19 NOTE — Progress Notes (Signed)
31 y.o. G0P0000 Married  Caucasian Fe here for annual exam. Periods slight change with coming of OCP, to try for pregnancy. Sees Dr. Idamae Schuller at [facility] for lung issues and allergies([facility]). Discussed with regarding medication use if pregnant.  Has been off OCP for 11 months now. Patient is currently tracking cycles and ovulatory pattern. Has been trying frequently. Not anxious about pregnancy, at this point. She and spouse have been busy with personal projects and now just about completed. No concerns. Some fatigue, but feel related to weight and working? Weight loss is the next project. Aware of heath importance.Taking Prenatal Vitamins daily. No other health concerns today. Labs if needed.  Patient's last menstrual period was 05/14/2017 (exact date).          Sexually active: Yes.    The current method of family planning is none--trying for pregnancy.    Exercising: No.   Smoker:  no  Health Maintenance: Pap: 04-30-15 Neg:Neg HR HPV, 04-11-13 Neg History of Abnormal Pap: no MMG:  n/a Self Breast exams: yes Colonoscopy:  n/a BMD:   n/a TDaP:  2007--declines (had reaction of fever & swelling) Shingles: no Pneumonia: 06/2015 Hep C and HIV: 04-30-15 Hep C & HIV Neg Labs: labs through work   reports that  has never smoked. she has never used smokeless tobacco. She reports that she drinks about 1.2 oz of alcohol per week. She reports that she does not use drugs.  Past Medical History:  Diagnosis Date  . Asthma    exercise induced  . Migraines    no aura  . UTI (lower urinary tract infection)    post coital    Past Surgical History:  Procedure Laterality Date  . KNEE SURGERY Left 2016    Current Outpatient Medications  Medication Sig Dispense Refill  . albuterol (PROVENTIL HFA;VENTOLIN HFA) 108 (90 BASE) MCG/ACT inhaler Inhale into the lungs every 6 (six) hours as needed for wheezing or shortness of breath.    . Ascorbic Acid (VITAMIN C) 1000 MG tablet Take 1,000 mg by  mouth daily.    Marland Kitchen azelastine (ASTELIN) 0.1 % nasal spray Place into both nostrils 2 (two) times daily. Use in each nostril as directed    . azelastine (OPTIVAR) 0.05 % ophthalmic solution INSTILL 1 DROP IN THE BOTH EYES TWICE DAILY  12  . budesonide (PULMICORT) 0.5 MG/2ML nebulizer solution USE 2 MLS (0.5 MG TOTAL) BY NEBULIZATION 2 (TWO) TIMES DAILY. NASAL RINSE    . CRANBERRY CONCENTRATE PO Take by mouth. c-bacillus coag 250-30-50    . fluticasone furoate-vilanterol (BREO ELLIPTA) 100-25 MCG/INH AEPB     . Prenatal Vit-Fe Fumarate-FA (PRENATAL VITAMIN PO) Take by mouth daily.     No current facility-administered medications for this visit.     Family History  Problem Relation Age of Onset  . Hypertension Mother   . Heart disease Father     ROS:  Pertinent items are noted in HPI.  Otherwise, a comprehensive ROS was negative.  Exam:   BP 110/70 (BP Location: Right Arm, Patient Position: Sitting, Cuff Size: Large)   Pulse 76   Resp 16   Ht 5\' 5"  (1.651 m)   Wt 229 lb 9.6 oz (104.1 kg)   LMP 05/14/2017 (Exact Date)   BMI 38.21 kg/m  Height: 5\' 5"  (165.1 cm) Ht Readings from Last 3 Encounters:  05/22/17 5\' 5"  (1.651 m)  11/29/16 5' 4.5" (1.638 m)  04/28/16 5' 4.5" (1.638 m)    General appearance: alert,  cooperative and appears stated age Head: Normocephalic, without obvious abnormality, atraumatic Neck: no adenopathy, supple, symmetrical, trachea midline and thyroid normal to inspection and palpation Lungs: clear to auscultation bilaterally Breasts: normal appearance, no masses or tenderness, No nipple retraction or dimpling, No nipple discharge or bleeding, No axillary or supraclavicular adenopathy Heart: regular rate and rhythm Abdomen: soft, non-tender; no masses,  no organomegaly Extremities: extremities normal, atraumatic, no cyanosis or edema Skin: Skin color, texture, turgor normal. No rashes or lesions Lymph nodes: Cervical, supraclavicular, and axillary nodes  normal. No abnormal inguinal nodes palpated Neurologic: Grossly normal   Pelvic: External genitalia:  no lesions              Urethra:  normal appearing urethra with no masses, tenderness or lesions              Bartholin's and Skene's: normal                 Vagina: normal appearing vagina with normal color and discharge, no lesions              Cervix: no cervical motion tenderness, no lesions and nulliparous appearance              Pap taken: Yes.   Bimanual Exam:  Uterus:  normal size, contour, position, consistency, mobility, non-tender              Adnexa: normal adnexa and no mass, fullness, tenderness               Rectovaginal: Confirms               Anus:  normal sphincter tone, no lesions  Chaperone present: yes  A:  Well Woman with normal exam  Contraception none desired, pregnancy would be welcome  Allergy management with FNP, stable, Pulmonary asthma management with MD at [facility], all stable per patient  Obesity   Immunization update overdue TDAP, but allergy to vaccine  P:   Reviewed health and wellness pertinent to exam  Discussed if no pregnancy in next 6 months, if trying, may consider spouse having sperm count (age 65) and follow up here if patient desires. She will advise if concerns . Continue with daily prenatal vitamins .  Continue follow up with MD as indicated  Stressed weight loss importance prior to pregnancy and this can also benefit conception as well as overall health .  Labs: TSH, Vitamin D  Pap smear: yes   counseled on breast self exam, adequate intake of calcium and vitamin D, diet and exercise return annually or prn  An After Visit Summary was printed and given to the patient.

## 2017-05-22 ENCOUNTER — Encounter: Payer: Self-pay | Admitting: Certified Nurse Midwife

## 2017-05-22 ENCOUNTER — Other Ambulatory Visit (HOSPITAL_COMMUNITY)
Admission: RE | Admit: 2017-05-22 | Discharge: 2017-05-22 | Disposition: A | Discharge status: Home / Self Care | Payer: Commercial Managed Care - PPO | Source: Ambulatory Visit | Attending: Certified Nurse Midwife | Admitting: Certified Nurse Midwife

## 2017-05-22 ENCOUNTER — Ambulatory Visit (INDEPENDENT_AMBULATORY_CARE_PROVIDER_SITE_OTHER): Payer: Commercial Managed Care - PPO | Admitting: Certified Nurse Midwife

## 2017-05-22 ENCOUNTER — Other Ambulatory Visit: Payer: Self-pay

## 2017-05-22 VITALS — BP 110/70 | HR 76 | Resp 16 | Ht 65.0 in | Wt 229.6 lb

## 2017-05-22 DIAGNOSIS — Z124 Encounter for screening for malignant neoplasm of cervix: Secondary | ICD-10-CM

## 2017-05-22 DIAGNOSIS — Z01419 Encounter for gynecological examination (general) (routine) without abnormal findings: Secondary | ICD-10-CM

## 2017-05-22 DIAGNOSIS — Z0000 Encounter for general adult medical examination without abnormal findings: Secondary | ICD-10-CM | POA: Diagnosis not present

## 2017-05-22 DIAGNOSIS — R5383 Other fatigue: Secondary | ICD-10-CM | POA: Diagnosis not present

## 2017-05-22 NOTE — Patient Instructions (Signed)

## 2017-05-23 LAB — TSH: TSH: 1.34 u[IU]/mL (ref 0.450–4.500)

## 2017-05-23 LAB — VITAMIN D 25 HYDROXY (VIT D DEFICIENCY, FRACTURES): VIT D 25 HYDROXY: 25.9 ng/mL — AB (ref 30.0–100.0)

## 2017-05-25 LAB — CYTOLOGY - PAP
DIAGNOSIS: NEGATIVE
HPV (WINDOPATH): NOT DETECTED

## 2017-10-12 ENCOUNTER — Encounter: Payer: Self-pay | Admitting: Certified Nurse Midwife

## 2017-10-12 ENCOUNTER — Telehealth: Payer: Self-pay | Admitting: Certified Nurse Midwife

## 2017-10-12 NOTE — Telephone Encounter (Signed)
Patient sent the following correspondence through [facility]. Routing to triage to assist patient with request.  ----- Message from [place], Generic sent at 10/12/2017 4:41 PM EDT -----    I have a found bump/lump on my left breast. I just found it yesterday morning. How long should I wait to get it checked? It might just be a bump since I have been having the other issue, but never in the spot that found it yesterday.    Thanks,  Misty Combs   Last seen: 05/22/17

## 2017-10-12 NOTE — Telephone Encounter (Signed)
Spoke with patient. Reports left breast lump, long not round. Skin is peeling where lump is located. Noticed yesterday, is unsure if this a 'bump" or not . Not currently on menses, due to start in 1 wk. Denies pain, redness, or nipple d/c.   Recommended OV for further evaluation. OV scheduled for 10/13/17 at 10am with Melvia Heaps, CNM.  Routing to provider for final review. Patient is agreeable to disposition. Will close encounter.

## 2017-10-13 ENCOUNTER — Ambulatory Visit: Payer: Commercial Managed Care - PPO | Admitting: Certified Nurse Midwife

## 2017-10-13 ENCOUNTER — Encounter: Payer: Self-pay | Admitting: Certified Nurse Midwife

## 2017-10-13 ENCOUNTER — Other Ambulatory Visit: Payer: Self-pay

## 2017-10-13 VITALS — BP 112/72 | HR 80 | Resp 20 | Ht 65.0 in | Wt 225.0 lb

## 2017-10-13 DIAGNOSIS — Z0000 Encounter for general adult medical examination without abnormal findings: Secondary | ICD-10-CM

## 2017-10-13 DIAGNOSIS — N6082 Other benign mammary dysplasias of left breast: Secondary | ICD-10-CM

## 2017-10-13 NOTE — Progress Notes (Signed)
   Subjective:   32 y.o. MarriedCaucasian female presents for evaluation of left breast bump or lump per patient. Noted 2 days ago in shower.Denies tenderness or nipple discharge. Does have skin bumps on breast periodically and under arm, but wondered if this was same. Does SBE. No new detergents or bras..  Contributing factors include none. Denies chills and fevers. Patient denies history of trauma, bites, or injuries. Last mammogram was never.    Review of Systems Pertinent to HPI   Objective:   General appearance: alert, cooperative, appears stated age, no distress and mildly obese Breasts: normal appearance, no masses or tenderness, No nipple retraction or dimpling, No nipple discharge or bleeding, No axillary or supraclavicular adenopathy, positive findings: sebacceous cyst noted at 7 o'clock, non tender, slight increase pink, no discharge or exudate     Assessment:   ASSESSMENT:Patient is diagnosed with normal breast exam and sebacous cyst resolving   Plan:   PLAN:Discussed finding of normal breast exam with patient. Discussed finding of sebaceous cyst resolving and benign finding. Etiology discussed. Discussed epsom bath to help with prevention as she has used for other issues. Questions addressed. Shown area with mirror and relieved. Continue SBE. Can use cornstarch under breast to help with perspiration and prevent skin break down.  Rv prn

## 2017-10-13 NOTE — Patient Instructions (Signed)
Continue SBE and come with changes

## 2018-04-27 ENCOUNTER — Other Ambulatory Visit: Payer: Self-pay

## 2018-04-27 ENCOUNTER — Emergency Department (HOSPITAL_COMMUNITY): Payer: Commercial Managed Care - PPO

## 2018-04-27 ENCOUNTER — Encounter (HOSPITAL_COMMUNITY): Payer: Self-pay

## 2018-04-27 ENCOUNTER — Emergency Department (HOSPITAL_COMMUNITY)
Admission: EM | Admit: 2018-04-27 | Discharge: 2018-04-27 | Disposition: A | Discharge status: Home / Self Care | Payer: Commercial Managed Care - PPO | Attending: Emergency Medicine | Admitting: Emergency Medicine

## 2018-04-27 DIAGNOSIS — J45909 Unspecified asthma, uncomplicated: Secondary | ICD-10-CM | POA: Diagnosis not present

## 2018-04-27 DIAGNOSIS — R918 Other nonspecific abnormal finding of lung field: Secondary | ICD-10-CM | POA: Diagnosis not present

## 2018-04-27 DIAGNOSIS — Z79899 Other long term (current) drug therapy: Secondary | ICD-10-CM | POA: Diagnosis not present

## 2018-04-27 DIAGNOSIS — R079 Chest pain, unspecified: Secondary | ICD-10-CM | POA: Diagnosis present

## 2018-04-27 LAB — BASIC METABOLIC PANEL
Anion gap: 9 (ref 5–15)
BUN: 12 mg/dL (ref 6–20)
CO2: 22 mmol/L (ref 22–32)
Calcium: 9 mg/dL (ref 8.9–10.3)
Chloride: 109 mmol/L (ref 98–111)
Creatinine, Ser: 0.74 mg/dL (ref 0.44–1.00)
GFR calc Af Amer: >60 mL/min (ref >60)
GFR calc non Af Amer: >60 mL/min (ref >60)
Glucose, Bld: 153 mg/dL — ABNORMAL HIGH (ref 70–99)
Potassium: 3.8 mmol/L (ref 3.5–5.1)
Sodium: 140 mmol/L (ref 135–145)

## 2018-04-27 LAB — I-STAT TROPONIN, ED: Troponin i, poc: 0 ng/mL (ref 0.00–0.08)

## 2018-04-27 LAB — HEPATIC FUNCTION PANEL
ALT: 25 U/L (ref 0–44)
AST: 16 U/L (ref 15–41)
Albumin: 4 g/dL (ref 3.5–5.0)
Alkaline Phosphatase: 90 U/L (ref 38–126)
Bilirubin, Direct: 0.1 mg/dL (ref 0.0–0.2)
Indirect Bilirubin: 0.4 mg/dL (ref 0.3–0.9)
Total Bilirubin: 0.5 mg/dL (ref 0.3–1.2)
Total Protein: 7.8 g/dL (ref 6.5–8.1)

## 2018-04-27 LAB — CBC
HEMATOCRIT: 40.7 % (ref 36.0–46.0)
Hemoglobin: 13.3 g/dL (ref 12.0–15.0)
MCH: 29.1 pg (ref 26.0–34.0)
MCHC: 32.7 g/dL (ref 30.0–36.0)
MCV: 89.1 fL (ref 80.0–100.0)
Platelets: 366 10*3/uL (ref 150–400)
RBC: 4.57 MIL/uL (ref 3.87–5.11)
RDW: 12.3 % (ref 11.5–15.5)
WBC: 6.5 10*3/uL (ref 4.0–10.5)
nRBC: 0 % (ref 0.0–0.2)

## 2018-04-27 LAB — LIPASE, BLOOD: Lipase: 23 U/L (ref 11–51)

## 2018-04-27 LAB — I-STAT BETA HCG BLOOD, ED (MC, WL, AP ONLY)

## 2018-04-27 MED ORDER — IOPAMIDOL (ISOVUE-370) INJECTION 76%
100.0000 mL | Freq: Once | INTRAVENOUS | Status: AC | PRN
  Administered 2018-04-27: 100 mL via INTRAVENOUS

## 2018-04-27 MED ORDER — MORPHINE SULFATE (PF) 4 MG/ML IV SOLN
4.0000 mg | Freq: Once | INTRAVENOUS | Status: AC
  Administered 2018-04-27: 4 mg via INTRAVENOUS
  Filled 2018-04-27: qty 1

## 2018-04-27 MED ORDER — ONDANSETRON HCL 4 MG/2ML IJ SOLN
4.0000 mg | Freq: Once | INTRAMUSCULAR | Status: AC
  Administered 2018-04-27: 4 mg via INTRAVENOUS
  Filled 2018-04-27: qty 2

## 2018-04-27 MED ORDER — IOPAMIDOL (ISOVUE-370) INJECTION 76%
INTRAVENOUS | Status: AC
  Filled 2018-04-27: qty 100

## 2018-04-27 MED ORDER — HYDROCODONE-ACETAMINOPHEN 5-325 MG PO TABS: 1.0000 | ORAL_TABLET | Freq: Four times a day (QID) | ORAL | 0 refills | Status: DC | PRN

## 2018-04-27 MED ORDER — SODIUM CHLORIDE (PF) 0.9 % IJ SOLN
INTRAMUSCULAR | Status: AC
  Filled 2018-04-27: qty 50

## 2018-04-27 NOTE — ED Triage Notes (Signed)
Patient c/o left chest pain yesterday and worsening today with the pain radiating to her left shoulder.  Patient went to her physician and was told to come to the ED to rule PE. Pain is worse with inspiration.  Patient also also c/o left upper quadrant pain/nausea and was told to come to the ED to rule pancreatitis.

## 2018-04-27 NOTE — Discharge Instructions (Signed)
You have been evaluated for your chest pain.  CT shows a mass in your left lung.  Please call and follow up closely with pulmonologist in the next few days for further evaluation of this mass.  Discuss this with your doctor as well.  Take vicodin as needed for pain but do not drive or operate heavy machinery as it can cause drowsiness.  Return to the ER if you have any concerns.

## 2018-04-27 NOTE — ED Provider Notes (Signed)
[facility] DEPT Provider Note   CSN: [id number] Arrival date & time: 04/27/18  1417     History   Chief Complaint Chief Complaint  Patient presents with  . Abdominal Pain  . Chest Pain    HPI Misty Combs is a 32 y.o. female.  The history is provided by the patient. No language interpreter was used.     32 year old female with history of asthma, migraine, sent here by PCP for evaluation of chest pain.  Patient report yesterday developed gradual onset of pain to the left side of her chest.  She described pain as a sharp and pleuritic, with mild nausea, shortness of breath, and pain radiates to the left shoulder and back.  Pain has been waxing waning, worsening with movement as well.  She was concerned, went to see her doctor today but was sent to the ED for further evaluation of her condition.  Right now pain is moderate in severity, and there is no associated fever, chills, productive cough, hemoptysis, abdominal pain, vomiting, diarrhea, dysuria or rash.  No recent heavy strenuous activity.  No prior history of pancreatitis, denies alcohol or tobacco abuse.  No prior history of PE or DVT, no recent surgery, prolonged bedrest, active cancer or hemoptysis.  She does not take birth control pill.  Past Medical History:  Diagnosis Date  . Asthma    exercise induced  . Migraines    no aura  . UTI (lower urinary tract infection)    post coital    Patient Active Problem List   Diagnosis Date Noted  . Chronic sinusitis   . Asthma, persistent controlled 11/27/2012    Past Surgical History:  Procedure Laterality Date  . KNEE SURGERY Left 2016     OB History    Gravida  0   Para  0   Term  0   Preterm  0   AB  0   Living  0     SAB  0   TAB  0   Ectopic  0   Multiple  0   Live Births               Home Medications    Prior to Admission medications   Medication Sig Start Date End Date Taking? Authorizing  Provider  albuterol (PROVENTIL HFA;VENTOLIN HFA) 108 (90 BASE) MCG/ACT inhaler Inhale into the lungs every 6 (six) hours as needed for wheezing or shortness of breath.    [provider]  azelastine (ASTELIN) 0.1 % nasal spray Place into both nostrils 2 (two) times daily. Use in each nostril as directed    [provider]  Bepotastine Besilate (BEPREVE) 1.5 % SOLN Apply to eye. 08/29/17 04/21/19  [provider]  budesonide (PULMICORT) 0.5 MG/2ML nebulizer solution USE 2 MLS (0.5 MG TOTAL) BY NEBULIZATION 2 (TWO) TIMES DAILY. NASAL RINSE 12/23/13   [provider]  CRANBERRY CONCENTRATE PO Take by mouth. c-bacillus coag 250-30-50    [provider]  fluticasone furoate-vilanterol (BREO ELLIPTA) 100-25 MCG/INH AEPB  08/29/16   [provider]  Prenatal Vit-Fe Fumarate-FA (PRENATAL VITAMIN PO) Take by mouth daily.    [provider]    Family History Family History  Problem Relation Age of Onset  . Hypertension Mother   . Heart disease Father     Social History Social History   Tobacco Use  . Smoking status: Never Smoker  . Smokeless tobacco: Never Used  Substance Use Topics  .  Alcohol use: Yes    Alcohol/week: 2.0 standard drinks    Types: 2 Glasses of wine per week  . Drug use: No     Allergies   Penicillins; Sulfur; and Tetanus toxoids   Review of Systems Review of Systems  All other systems reviewed and are negative.    Physical Exam Updated Vital Signs BP 127/89 (BP Location: Right Arm)   Pulse (!) 109   Temp 98.4 F (36.9 C) (Oral)   Resp 16   Ht 5\' 5"  (1.651 m)   Wt 97.1 kg   LMP 04/11/2018 (Approximate)   SpO2 98%   BMI 35.61 kg/m   Physical Exam  Constitutional: She appears well-developed and well-nourished. No distress.  Mildly obese, tearful but nontoxic  HENT:  Head: Atraumatic.  Eyes: Conjunctivae are normal.  Neck: Neck supple.  Cardiovascular: Intact distal pulses.  Tachycardia without  murmur rubs or gallops  Pulmonary/Chest: Effort normal and breath sounds normal. No stridor. She has no wheezes. She has no rhonchi. She has no rales. She exhibits no tenderness.  Abdominal: Soft. Normal appearance. There is no tenderness.  Musculoskeletal: She exhibits no edema.  Neurological: She is alert.  Skin: No rash noted.  Psychiatric: She has a normal mood and affect.  Nursing note and vitals reviewed.    ED Treatments / Results  Labs (all labs ordered are listed, but only abnormal results are displayed) Labs Reviewed  BASIC METABOLIC PANEL - Abnormal; Notable for the following components:      Result Value   Glucose, Bld 153 (*)    All other components within normal limits  CBC  LIPASE, BLOOD  HEPATIC FUNCTION PANEL  I-STAT TROPONIN, ED  I-STAT BETA HCG BLOOD, ED ([facility], [facility], AP ONLY)    EKG None  Date: 04/27/2018  Rate: 110  Rhythm: sinus tachycardia  QRS Axis: normal  Intervals: normal  ST/T Wave abnormalities: normal  Conduction Disutrbances: none  Narrative Interpretation:   Old EKG Reviewed: No significant changes noted      Radiology Dg Chest 2 View  Result Date: 04/27/2018 CLINICAL DATA:  32 year old female with pleuritic left chest pain radiating into the shoulder. EXAM: CHEST - 2 VIEW COMPARISON:  Prior chest x-ray 08/10/2011 FINDINGS: Focal curvilinear airspace opacity in the medial aspect of the left upper lobe overlying the AP window on the frontal view. There is associated volume loss with slight elevation of the left hemidiaphragm compared to prior imaging. Otherwise, the cardiac and mediastinal contours are within normal limits. The right lung is clear. The visualized upper abdominal bowel gas pattern is normal. IMPRESSION: Focal curvilinear airspace opacity in the medial aspect of the left upper lobe overlying the AP window on the frontal view with evidence of volume loss in the left hemithorax. Differential considerations include focal atelectasis,  pneumonia, and central obstructing lesion such as endobronchial carcinoid. Recommend further evaluation with CT scan of the chest. Electronically Signed   By: Jacqulynn Cadet M.D.   On: 04/27/2018 15:30   Ct Angio Chest Pe W And/or Wo Contrast  Result Date: 04/27/2018 CLINICAL DATA:  Left-sided chest pain yesterday worsening today with pain radiating to the left shoulder. EXAM: CT ANGIOGRAPHY CHEST WITH CONTRAST TECHNIQUE: Multidetector CT imaging of the chest was performed using the standard protocol during bolus administration of intravenous contrast. Multiplanar CT image reconstructions and MIPs were obtained to evaluate the vascular anatomy. CONTRAST:  138mL ISOVUE-370 IOPAMIDOL (ISOVUE-370) INJECTION 76% COMPARISON:  Same day CXR FINDINGS: Cardiovascular: The study is of  quality for the evaluation of pulmonary embolism. There are no filling defects in the central, lobar, segmental or subsegmental pulmonary artery branches to suggest acute pulmonary embolism. Great vessels are normal in course and caliber. Normal heart size. No significant pericardial fluid/thickening. Mediastinum/Nodes: No discrete thyroid nodules or thyromegaly. Unremarkable esophagus. 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 This is causing extrinsic mass effect on the left upper lobe pulmonary artery. Smaller right paratracheal lymph node measuring 11 mm identified. Lungs/Pleura: No pneumothorax. Small left effusion is identified with atelectatic change adjacent to the described left superior mediastinal mass. Upper abdomen: Adrenal mass. The included liver demonstrates no enhancing lesions. No biliary dilatation is identified. The gallbladder is physiologically distended to the extent visualized without stones. Musculoskeletal:  No aggressive appearing focal osseous lesions. Review of the MIP images confirms the above findings.  IMPRESSION: 1. 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 Adjacent atelectasis to the superior mediastinal mass is also noted. Findings are suspicious for possible lymphoma or an abutting left upper lobe primary bronchogenic neoplasm. Referral to pulmonology or cardiothoracic surgery for further disposition is recommended. 2. Extrinsic mass-effect from the mediastinal mass on the left upper lobe pulmonary artery is noted without occlusion. No acute pulmonary embolus. 3. Nonaneurysmal thoracic aorta without dissection. 4. No thyromegaly or mass. Electronically Signed   By: Ashley Royalty M.D.   On: 04/27/2018 19:14    Procedures Procedures (including critical care time)  Medications Ordered in ED Medications  sodium chloride (PF) 0.9 % injection (has no administration in time range)  iopamidol (ISOVUE-370) 76 % injection (has no administration in time range)  morphine 4 MG/ML injection 4 mg (has no administration in time range)  morphine 4 MG/ML injection 4 mg (4 mg Intravenous Given 04/27/18 1546)  ondansetron (ZOFRAN) injection 4 mg (4 mg Intravenous Given 04/27/18 1546)  iopamidol (ISOVUE-370) 76 % injection 100 mL (100 mLs Intravenous Contrast Given 04/27/18 1839)     Initial Impression / Assessment and Plan / ED Course  I have reviewed the triage vital signs and the nursing notes.  Pertinent labs & imaging results that were available during my care of the patient were reviewed by me and considered in my medical decision making (see chart for details).     BP (!) 126/91 (BP Location: Right Arm)   Pulse (!) 102   Temp 98.4 F (36.9 C) (Oral)   Resp 18   Ht 5\' 5"  (1.651 m)   Wt 97.1 kg   LMP 04/11/2018 (Approximate)   SpO2 98%   BMI 35.61 kg/m    Final Clinical Impressions(s) / ED Diagnoses   Final diagnoses:  Mass of left lung     ED Discharge Orders         Ordered    HYDROcodone-acetaminophen (NORCO/VICODIN) 5-325 MG tablet  Every 6 hours PRN     04/27/18 2018         3:18 PM Patient here with pleuritic chest pain, shortness of breath, and tachycardia.  Sent here for rule out of PE.  She does not have any significant risk factor however she is tachycardic therefore she cannot be PERC out.  She does not have any significant abdominal discomfort on exam.  Therefore, will obtain chest CT  angiogram for further valuation.  8:11 PM Pregnancy test is negative, initial troponin unremarkable, electrolytes panels are reassuring, mild hyperglycemia with a CBG of 153, Normal WBC, normal H&H, normal lipase, normal hepatic function panel.  Initial chest x-ray shows a focal airspace opacity in the medial aspect of the left upper lobe.Chest CT angiogram obtained showed no evidence of PE but there is a left-sided mediastinal mass measuring approximately 6 x 7 x 5 cm with associated small pleural effusion and atelectasis.  This mass is concerning for possible lymphoma or primary bronchogenic neoplasm.  I discussed this finding with patient.  She is not a smoker, does not have any strong family history of malignancy.  She does have a PCP in [place].  I discussed with Dr. Eulis Foster, plan to have pt f/u with pulmonologist for outpt evaluation.  Pt needs biopsy of the mass.     Domenic Moras, PA-C 04/27/18 2049    Daleen Bo, MD 04/28/18 [phone]

## 2018-04-30 ENCOUNTER — Encounter: Payer: Self-pay | Admitting: Internal Medicine

## 2018-04-30 ENCOUNTER — Ambulatory Visit: Payer: Commercial Managed Care - PPO | Admitting: Internal Medicine

## 2018-04-30 ENCOUNTER — Telehealth: Payer: Self-pay

## 2018-04-30 VITALS — BP 138/90 | HR 92 | Resp 16 | Ht 65.0 in | Wt 212.0 lb

## 2018-04-30 DIAGNOSIS — R918 Other nonspecific abnormal finding of lung field: Secondary | ICD-10-CM | POA: Diagnosis not present

## 2018-04-30 MED ORDER — HYDROCODONE-ACETAMINOPHEN 5-325 MG PO TABS: 1.0000 | ORAL_TABLET | Freq: Four times a day (QID) | ORAL | 0 refills | Status: DC | PRN

## 2018-04-30 NOTE — H&P (View-Only) (Signed)
Name: Misty Combs MRN: [id number] DOB: August 16, 1985     CONSULTATION DATE: 12.9.19 REFERRING MD : sistasis  CHIEF COMPLAINT: abnormal CT chest  STUDIES:  12.6.19    CT chest Independently reviewed by Me today Large mediastinal mass Left upper lobe lung collapse    HISTORY OF PRESENT ILLNESS: 32 year old pleasant white female seen today for abnormal CT chest She has been having dull aches and left-sided chest pressure for approximately 1 year Patient had subsequent ongoing pain and was seen in the ER Patient had a chest x-ray and a subsequent CT chest  The chest x-ray showed mediastinal fullness Subsequent CT chest was obtained on 04/27/2018 that showed a large mediastinal mass with left upper lobe lung collapse   Patient does have a history of asthma which seems to be well-controlled at this time Has been seen by [facility] as well as asthma continues to take Pulmicort nasal sprays as well as Breo  I have reviewed the CT scan findings with the patient and mom I would recommend obtaining an performing procedure as soon as possible   The Risks and Benefits of the Bronchoscopy procedure with electromagnetic navigational bronchoscopy and bronchoscopy were explained to patient/family.  I have discussed the risk for Acute Bleeding, increased chance of Infection, increased chance of Respiratory Failure and Cardiac Arrest, increased chance of pneumothorax and collapsed lung, as well as increased Stroke and Death.  I have also explained to avoid all types of NSAIDs 1 week prior to procedure date  to decrease chance of bleeding, and also to avoid food and drinks the midnight prior to procedure.  The procedure consists of a video camera with a light source to be placed and inserted  into the lungs to  look for abnormal tissue and to obtain tissue samples by using needle and biopsy tools.  The patient/family understand the risks and benefits and have agreed  to proceed with procedure.    PAST MEDICAL HISTORY :   has a past medical history of Asthma, Migraines, and UTI (lower urinary tract infection).  has a past surgical history that includes Knee surgery (Left, 2016). Prior to Admission medications   Medication Sig Start Date End Date Taking? Authorizing Provider  albuterol (PROVENTIL HFA;VENTOLIN HFA) 108 (90 BASE) MCG/ACT inhaler Inhale into the lungs every 6 (six) hours as needed for wheezing or shortness of breath.    [provider]  amitriptyline (ELAVIL) 25 MG tablet Take 25 mg by mouth at bedtime.  04/02/18   [provider]  azelastine (ASTELIN) 0.1 % nasal spray Place 2 sprays into both nostrils 2 (two) times daily. Use in each nostril as directed     [provider]  Bepotastine Besilate (BEPREVE) 1.5 % SOLN Apply 1 drop to eye 2 (two) times daily.  08/29/17 04/21/19  [provider]  budesonide (PULMICORT) 0.5 MG/2ML nebulizer solution USE 2 MLS (0.5 MG TOTAL) BY NEBULIZATION 2 (TWO) TIMES DAILY. NASAL RINSE 12/23/13   [provider]  dicyclomine (BENTYL) 20 MG tablet Take 20 mg by mouth 4 (four) times daily as needed for spasms.  04/02/18   [provider]  fluticasone furoate-vilanterol (BREO ELLIPTA) 100-25 MCG/INH AEPB Inhale 1 puff into the lungs daily.  08/29/16   [provider]  HYDROcodone-acetaminophen (NORCO/VICODIN) 5-325 MG tablet Take 1 tablet by mouth every 6 (six) hours as needed for moderate pain or severe pain. 04/27/18   Domenic Moras, PA-C  levocetirizine (XYZAL) 5 MG tablet Take  5 mg by mouth daily.    [provider]  Prenatal Vit-Fe Fumarate-FA (PRENATAL VITAMIN PO) Take 1 tablet by mouth daily.     [provider]   Allergies  Allergen Reactions  . Penicillins Anaphylaxis  . Sulfur Nausea And Vomiting    Dizzy  . Tetanus Toxoids Swelling    Fever and swelling    FAMILY HISTORY:  family history includes Heart disease in her father;  Hypertension in her mother. SOCIAL HISTORY:  reports that she has never smoked. She has never used smokeless tobacco. She reports that she drinks about 2.0 standard drinks of alcohol per week. She reports that she does not use drugs.  REVIEW OF SYSTEMS:   Constitutional: Negative for fever, chills, weight loss, malaise/fatigue and diaphoresis.  HENT: Negative for hearing loss, ear pain, nosebleeds, congestion, sore throat, neck pain, tinnitus and ear discharge.   Eyes: Negative for blurred vision, double vision, photophobia, pain, discharge and redness.  Respiratory: Negative for cough, hemoptysis, sputum production, shortness of breath, wheezing and stridor.   Cardiovascular: Negative for chest pain, palpitations, orthopnea, claudication, leg swelling and PND.  Gastrointestinal: Negative for heartburn, nausea, vomiting, abdominal pain, diarrhea, constipation, blood in stool and melena.  Genitourinary: Negative for dysuria, urgency, frequency, hematuria and flank pain.  Musculoskeletal: Negative for myalgias, back pain, joint pain and falls.  Skin: Negative for itching and rash.  Neurological: Negative for dizziness, tingling, tremors, sensory change, speech change, focal weakness, seizures, loss of consciousness, weakness and headaches.  Endo/Heme/Allergies: Negative for environmental allergies and polydipsia. Does not bruise/bleed easily.  ALL OTHER ROS ARE NEGATIVE  BP 138/90 (BP Location: Left Arm, Cuff Size: Large)   Pulse 92   Resp 16   Ht 5\' 5"  (1.651 m)   Wt 212 lb (96.2 kg)   LMP 04/11/2018 (Approximate)   SpO2 98%   BMI 35.28 kg/m    Physical Examination:   GENERAL:NAD, no fevers, chills, no weakness no fatigue HEAD: Normocephalic, atraumatic.  EYES: Pupils equal, round, reactive to light. Extraocular muscles intact. No scleral icterus.  MOUTH: Moist mucosal membrane.   EAR, NOSE, THROAT: Clear without exudates. No external lesions.  NECK: Supple. No thyromegaly. No  nodules. No JVD.  PULMONARY:CTA B/L no wheezes, no crackles, no rhonchi CARDIOVASCULAR: S1 and S2. Regular rate and rhythm. No murmurs, rubs, or gallops. No edema.  GASTROINTESTINAL: Soft, nontender, nondistended. No masses. Positive bowel sounds.  MUSCULOSKELETAL: No swelling, clubbing, or edema. Range of motion full in all extremities.  NEUROLOGIC: Cranial nerves II through XII are intact. No gross focal neurological deficits.  SKIN: No ulceration, lesions, rashes, or cyanosis. Skin warm and dry. Turgor intact.  PSYCHIATRIC: Mood, affect within normal limits. The patient is awake, alert and oriented x 3. Insight, judgment intact.      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 All questions answered  Corrin Parker, M.D.  [facility]  Medical Director [place] Director [facility] Cardio-Pulmonary Department

## 2018-04-30 NOTE — Patient Instructions (Signed)
Plan for Bronchoscopy    The Risks and Benefits of the Bronchoscopy procedure with ENB were explained to patient/family.  I have discussed the risk for Acute Bleeding, increased chance of Infection, increased chance of Respiratory Failure and Cardiac Arrest, increased chance of pneumothorax and collapsed lung, as well as increased Stroke and Death.  I have also explained to avoid all types of NSAIDs 1 week prior to procedure date  to decrease chance of bleeding, and also to avoid food and drinks the midnight prior to procedure.  The procedure consists of a video camera with a light source to be placed and inserted  into the lungs to  look for abnormal tissue and to obtain tissue samples by using needle and biopsy tools.  The patient/family understand the risks and benefits and have agreed to proceed with procedure.

## 2018-04-30 NOTE — Telephone Encounter (Signed)
DX: Mediastinal mass Proc: ENB  CPT: 40335 Date: 05/02/18@1300 

## 2018-04-30 NOTE — Progress Notes (Signed)
Name: Misty Combs MRN: [id number] DOB: [birth year]     CONSULTATION DATE: 12.9.19 REFERRING MD : sistasis  CHIEF COMPLAINT: abnormal CT chest  STUDIES:  12.6.19    CT chest Independently reviewed by Me today Large mediastinal mass Left upper lobe lung collapse    HISTORY OF PRESENT ILLNESS: 32 year old pleasant white female seen today for abnormal CT chest She has been having dull aches and left-sided chest pressure for approximately 1 year Patient had subsequent ongoing pain and was seen in the ER Patient had a chest x-ray and a subsequent CT chest  The chest x-ray showed mediastinal fullness Subsequent CT chest was obtained on 04/27/2018 that showed a large mediastinal mass with left upper lobe lung collapse   Patient does have a history of asthma which seems to be well-controlled at this time Has been seen by [facility] as well as asthma continues to take Pulmicort nasal sprays as well as Breo  I have reviewed the CT scan findings with the patient and mom I would recommend obtaining an performing procedure as soon as possible   The Risks and Benefits of the Bronchoscopy procedure with electromagnetic navigational bronchoscopy and bronchoscopy were explained to patient/family.  I have discussed the risk for Acute Bleeding, increased chance of Infection, increased chance of Respiratory Failure and Cardiac Arrest, increased chance of pneumothorax and collapsed lung, as well as increased Stroke and Death.  I have also explained to avoid all types of NSAIDs 1 week prior to procedure date  to decrease chance of bleeding, and also to avoid food and drinks the midnight prior to procedure.  The procedure consists of a video camera with a light source to be placed and inserted  into the lungs to  look for abnormal tissue and to obtain tissue samples by using needle and biopsy tools.  The patient/family understand the risks and benefits and have agreed  to proceed with procedure.    PAST MEDICAL HISTORY :   has a past medical history of Asthma, Migraines, and UTI (lower urinary tract infection).  has a past surgical history that includes Knee surgery (Left, 2016). Prior to Admission medications   Medication Sig Start Date End Date Taking? Authorizing Provider  albuterol (PROVENTIL HFA;VENTOLIN HFA) 108 (90 BASE) MCG/ACT inhaler Inhale into the lungs every 6 (six) hours as needed for wheezing or shortness of breath.    [provider]  amitriptyline (ELAVIL) 25 MG tablet Take 25 mg by mouth at bedtime.  04/02/18   [provider]  azelastine (ASTELIN) 0.1 % nasal spray Place 2 sprays into both nostrils 2 (two) times daily. Use in each nostril as directed     [provider]  Bepotastine Besilate (BEPREVE) 1.5 % SOLN Apply 1 drop to eye 2 (two) times daily.  08/29/17 04/21/19  [provider]  budesonide (PULMICORT) 0.5 MG/2ML nebulizer solution USE 2 MLS (0.5 MG TOTAL) BY NEBULIZATION 2 (TWO) TIMES DAILY. NASAL RINSE 12/23/13   [provider]  dicyclomine (BENTYL) 20 MG tablet Take 20 mg by mouth 4 (four) times daily as needed for spasms.  04/02/18   [provider]  fluticasone furoate-vilanterol (BREO ELLIPTA) 100-25 MCG/INH AEPB Inhale 1 puff into the lungs daily.  08/29/16   [provider]  HYDROcodone-acetaminophen (NORCO/VICODIN) 5-325 MG tablet Take 1 tablet by mouth every 6 (six) hours as needed for moderate pain or severe pain. 04/27/18   Domenic Moras, PA-C  levocetirizine (XYZAL) 5 MG tablet Take  5 mg by mouth daily.    [provider]  Prenatal Vit-Fe Fumarate-FA (PRENATAL VITAMIN PO) Take 1 tablet by mouth daily.     [provider]   Allergies  Allergen Reactions  . Penicillins Anaphylaxis  . Sulfur Nausea And Vomiting    Dizzy  . Tetanus Toxoids Swelling    Fever and swelling    FAMILY HISTORY:  family history includes Heart disease in her father;  Hypertension in her mother. SOCIAL HISTORY:  reports that she has never smoked. She has never used smokeless tobacco. She reports that she drinks about 2.0 standard drinks of alcohol per week. She reports that she does not use drugs.  REVIEW OF SYSTEMS:   Constitutional: Negative for fever, chills, weight loss, malaise/fatigue and diaphoresis.  HENT: Negative for hearing loss, ear pain, nosebleeds, congestion, sore throat, neck pain, tinnitus and ear discharge.   Eyes: Negative for blurred vision, double vision, photophobia, pain, discharge and redness.  Respiratory: Negative for cough, hemoptysis, sputum production, shortness of breath, wheezing and stridor.   Cardiovascular: Negative for chest pain, palpitations, orthopnea, claudication, leg swelling and PND.  Gastrointestinal: Negative for heartburn, nausea, vomiting, abdominal pain, diarrhea, constipation, blood in stool and melena.  Genitourinary: Negative for dysuria, urgency, frequency, hematuria and flank pain.  Musculoskeletal: Negative for myalgias, back pain, joint pain and falls.  Skin: Negative for itching and rash.  Neurological: Negative for dizziness, tingling, tremors, sensory change, speech change, focal weakness, seizures, loss of consciousness, weakness and headaches.  Endo/Heme/Allergies: Negative for environmental allergies and polydipsia. Does not bruise/bleed easily.  ALL OTHER ROS ARE NEGATIVE  BP 138/90 (BP Location: Left Arm, Cuff Size: Large)   Pulse 92   Resp 16   Ht 5\' 5"  (1.651 m)   Wt 212 lb (96.2 kg)   LMP 04/11/2018 (Approximate)   SpO2 98%   BMI 35.28 kg/m    Physical Examination:   GENERAL:NAD, no fevers, chills, no weakness no fatigue HEAD: Normocephalic, atraumatic.  EYES: Pupils equal, round, reactive to light. Extraocular muscles intact. No scleral icterus.  MOUTH: Moist mucosal membrane.   EAR, NOSE, THROAT: Clear without exudates. No external lesions.  NECK: Supple. No thyromegaly. No  nodules. No JVD.  PULMONARY:CTA B/L no wheezes, no crackles, no rhonchi CARDIOVASCULAR: S1 and S2. Regular rate and rhythm. No murmurs, rubs, or gallops. No edema.  GASTROINTESTINAL: Soft, nontender, nondistended. No masses. Positive bowel sounds.  MUSCULOSKELETAL: No swelling, clubbing, or edema. Range of motion full in all extremities.  NEUROLOGIC: Cranial nerves II through XII are intact. No gross focal neurological deficits.  SKIN: No ulceration, lesions, rashes, or cyanosis. Skin warm and dry. Turgor intact.  PSYCHIATRIC: Mood, affect within normal limits. The patient is awake, alert and oriented x 3. Insight, judgment intact.      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 All questions answered  Corrin Parker, M.D.  [facility]  Medical Director [place] Director [facility] Cardio-Pulmonary Department

## 2018-05-01 NOTE — Telephone Encounter (Signed)
05/01/18 at 9:09 am EST spoke with Keenan Bachelor T at [facility]. Procedure code [phone] is a valid and billable code which does not require PA. Call Ref # [id number]. Rhonda J Cobb

## 2018-05-02 ENCOUNTER — Other Ambulatory Visit: Payer: Self-pay

## 2018-05-02 ENCOUNTER — Ambulatory Visit: Payer: Commercial Managed Care - PPO | Admitting: Anesthesiology

## 2018-05-02 ENCOUNTER — Ambulatory Visit: Payer: Commercial Managed Care - PPO

## 2018-05-02 ENCOUNTER — Encounter
Admission: RE | Disposition: A | Discharge status: Home / Self Care | Payer: Self-pay | Source: Ambulatory Visit | Attending: Internal Medicine

## 2018-05-02 ENCOUNTER — Ambulatory Visit
Admission: RE | Admit: 2018-05-02 | Discharge: 2018-05-02 | Disposition: A | Discharge status: Home / Self Care | Payer: Commercial Managed Care - PPO | Source: Ambulatory Visit | Attending: Internal Medicine | Admitting: Internal Medicine

## 2018-05-02 DIAGNOSIS — Z79899 Other long term (current) drug therapy: Secondary | ICD-10-CM | POA: Insufficient documentation

## 2018-05-02 DIAGNOSIS — R918 Other nonspecific abnormal finding of lung field: Secondary | ICD-10-CM | POA: Diagnosis not present

## 2018-05-02 DIAGNOSIS — J45909 Unspecified asthma, uncomplicated: Secondary | ICD-10-CM | POA: Diagnosis not present

## 2018-05-02 DIAGNOSIS — Z7951 Long term (current) use of inhaled steroids: Secondary | ICD-10-CM | POA: Diagnosis not present

## 2018-05-02 HISTORY — PX: ELECTROMAGNETIC NAVIGATION BROCHOSCOPY: SHX5369

## 2018-05-02 LAB — POCT PREGNANCY, URINE: Preg Test, Ur: NEGATIVE

## 2018-05-02 SURGERY — ELECTROMAGNETIC NAVIGATION BRONCHOSCOPY
Anesthesia: General | Laterality: Left

## 2018-05-02 MED ORDER — SUGAMMADEX SODIUM 200 MG/2ML IV SOLN
INTRAVENOUS | Status: AC
  Filled 2018-05-02: qty 2

## 2018-05-02 MED ORDER — SUGAMMADEX SODIUM 200 MG/2ML IV SOLN
INTRAVENOUS | Status: DC | PRN
  Administered 2018-05-02: 200 mg via INTRAVENOUS

## 2018-05-02 MED ORDER — DEXAMETHASONE SODIUM PHOSPHATE 10 MG/ML IJ SOLN
INTRAMUSCULAR | Status: AC
  Filled 2018-05-02: qty 1

## 2018-05-02 MED ORDER — PROPOFOL 10 MG/ML IV BOLUS
INTRAVENOUS | Status: AC
  Filled 2018-05-02: qty 20

## 2018-05-02 MED ORDER — ROCURONIUM BROMIDE 100 MG/10ML IV SOLN
INTRAVENOUS | Status: DC | PRN
  Administered 2018-05-02: 15 mg via INTRAVENOUS
  Administered 2018-05-02: 5 mg via INTRAVENOUS

## 2018-05-02 MED ORDER — FAMOTIDINE 20 MG PO TABS
20.0000 mg | ORAL_TABLET | Freq: Once | ORAL | Status: AC
  Administered 2018-05-02: 20 mg via ORAL

## 2018-05-02 MED ORDER — DEXAMETHASONE SODIUM PHOSPHATE 10 MG/ML IJ SOLN
INTRAMUSCULAR | Status: DC | PRN
  Administered 2018-05-02: 5 mg via INTRAVENOUS

## 2018-05-02 MED ORDER — MIDAZOLAM HCL 2 MG/2ML IJ SOLN
INTRAMUSCULAR | Status: AC
  Filled 2018-05-02: qty 2

## 2018-05-02 MED ORDER — LACTATED RINGERS IV SOLN
INTRAVENOUS | Status: DC
  Administered 2018-05-02: 11:00:00 via INTRAVENOUS

## 2018-05-02 MED ORDER — SUCCINYLCHOLINE CHLORIDE 20 MG/ML IJ SOLN
INTRAMUSCULAR | Status: DC | PRN
  Administered 2018-05-02: 100 mg via INTRAVENOUS

## 2018-05-02 MED ORDER — ONDANSETRON HCL 4 MG/2ML IJ SOLN
INTRAMUSCULAR | Status: DC | PRN
  Administered 2018-05-02: 4 mg via INTRAVENOUS

## 2018-05-02 MED ORDER — ONDANSETRON HCL 4 MG/2ML IJ SOLN
INTRAMUSCULAR | Status: AC
  Filled 2018-05-02: qty 2

## 2018-05-02 MED ORDER — SEVOFLURANE IN SOLN
RESPIRATORY_TRACT | Status: AC
  Filled 2018-05-02: qty 250

## 2018-05-02 MED ORDER — FAMOTIDINE 20 MG PO TABS
ORAL_TABLET | ORAL | Status: AC
  Filled 2018-05-02: qty 1

## 2018-05-02 MED ORDER — PROPOFOL 10 MG/ML IV BOLUS
INTRAVENOUS | Status: DC | PRN
  Administered 2018-05-02: 150 mg via INTRAVENOUS

## 2018-05-02 MED ORDER — MIDAZOLAM HCL 5 MG/5ML IJ SOLN
INTRAMUSCULAR | Status: DC | PRN
  Administered 2018-05-02: 2 mg via INTRAVENOUS

## 2018-05-02 MED ORDER — FENTANYL CITRATE (PF) 100 MCG/2ML IJ SOLN
INTRAMUSCULAR | Status: AC
  Filled 2018-05-02: qty 2

## 2018-05-02 MED ORDER — LIDOCAINE HCL (PF) 1 % IJ SOLN
INTRAMUSCULAR | Status: AC
  Filled 2018-05-02: qty 5

## 2018-05-02 MED ORDER — ROCURONIUM BROMIDE 50 MG/5ML IV SOLN
INTRAVENOUS | Status: AC
  Filled 2018-05-02: qty 1

## 2018-05-02 MED ORDER — LIDOCAINE HCL (CARDIAC) PF 100 MG/5ML IV SOSY
PREFILLED_SYRINGE | INTRAVENOUS | Status: DC | PRN
  Administered 2018-05-02: 60 mg via INTRAVENOUS

## 2018-05-02 MED ORDER — FENTANYL CITRATE (PF) 100 MCG/2ML IJ SOLN
INTRAMUSCULAR | Status: DC | PRN
  Administered 2018-05-02 (×2): 50 ug via INTRAVENOUS

## 2018-05-02 MED ORDER — SODIUM CHLORIDE 0.9 % IV SOLN: INTRAVENOUS | Status: DC

## 2018-05-02 MED ORDER — FENTANYL CITRATE (PF) 100 MCG/2ML IJ SOLN: 25.0000 ug | INTRAMUSCULAR | Status: DC | PRN

## 2018-05-02 MED ORDER — ONDANSETRON HCL 4 MG/2ML IJ SOLN: 4.0000 mg | Freq: Once | INTRAMUSCULAR | Status: DC | PRN

## 2018-05-02 MED ORDER — SUCCINYLCHOLINE CHLORIDE 20 MG/ML IJ SOLN
INTRAMUSCULAR | Status: AC
  Filled 2018-05-02: qty 1

## 2018-05-02 NOTE — Transfer of Care (Signed)
Immediate Anesthesia Transfer of Care Note  Patient: Misty Combs  Procedure(s) Performed: ELECTROMAGNETIC NAVIGATION BRONCHOSCOPY (Left )  Patient Location: PACU  Anesthesia Type:General  Level of Consciousness: sedated  Airway & Oxygen Therapy: Patient Spontanous Breathing and Patient connected to face mask oxygen  Post-op Assessment: Report given to RN and Post -op Vital signs reviewed and stable  Post vital signs: Reviewed and stable  Last Vitals:  Vitals Value Taken Time  BP 124/80 05/02/2018  2:18 PM  Temp    Pulse 87 05/02/2018  2:19 PM  Resp 16 05/02/2018  2:19 PM  SpO2 97 % 05/02/2018  2:19 PM  Vitals shown include unvalidated device data.  Last Pain:  Vitals:   05/02/18 1111  TempSrc: Temporal  PainSc: 0-No pain         Complications: No apparent anesthesia complications

## 2018-05-02 NOTE — Anesthesia Procedure Notes (Signed)
Procedure Name: Intubation Date/Time: 05/02/2018 1:06 PM Performed by: Dionne Bucy, CRNA Pre-anesthesia Checklist: Patient identified, Patient being monitored, Timeout performed, Emergency Drugs available and Suction available Patient Re-evaluated:Patient Re-evaluated prior to induction Oxygen Delivery Method: Circle system utilized Preoxygenation: Pre-oxygenation with 100% oxygen Induction Type: IV induction Ventilation: Mask ventilation without difficulty Laryngoscope Size: Mac and 3 Grade View: Grade II Tube type: Oral Tube size: 8.5 mm Number of attempts: 1 Airway Equipment and Method: Stylet Placement Confirmation: ETT inserted through vocal cords under direct vision,  positive ETCO2 and breath sounds checked- equal and bilateral Secured at: 21 cm Tube secured with: Tape Dental Injury: Teeth and Oropharynx as per pre-operative assessment  Comments: Pt noted to be anterior

## 2018-05-02 NOTE — Anesthesia Preprocedure Evaluation (Signed)
Anesthesia Evaluation  Patient identified by MRN, date of birth, ID band Patient awake    Reviewed: Allergy & Precautions, NPO status , Patient's Chart, lab work & pertinent test results  History of Anesthesia Complications Negative for: history of anesthetic complications  Airway Mallampati: II       Dental   Pulmonary asthma , neg sleep apnea, neg COPD,           Cardiovascular (-) hypertension(-) Past MI and (-) CHF (-) dysrhythmias (-) Valvular Problems/Murmurs     Neuro/Psych neg Seizures    GI/Hepatic Neg liver ROS, neg GERD  ,  Endo/Other  neg diabetes  Renal/GU negative Renal ROS     Musculoskeletal   Abdominal   Peds  Hematology   Anesthesia Other Findings   Reproductive/Obstetrics                             Anesthesia Physical Anesthesia Plan  ASA: II  Anesthesia Plan: General   Post-op Pain Management:    Induction: Intravenous  PONV Risk Score and Plan: 3 and Dexamethasone, Ondansetron and Midazolam  Airway Management Planned: Oral ETT  Additional Equipment:   Intra-op Plan:   Post-operative Plan:   Informed Consent: I have reviewed the patients History and Physical, chart, labs and discussed the procedure including the risks, benefits and alternatives for the proposed anesthesia with the patient or authorized representative who has indicated his/her understanding and acceptance.     Plan Discussed with:   Anesthesia Plan Comments:         Anesthesia Quick Evaluation

## 2018-05-02 NOTE — Op Note (Signed)
PROCEDURE: ENDOBRONCHIAL ULTRASOUND   PROCEDURE DATE: 05/02/2018  TIME:  NAME:  Emilea Goga  DOB:1986/02/03  MRN: [id number]     Indications/Preliminary Diagnosis:lung mass  Consent: (Place X beside choice/s below)  The benefits, risks and possible complications of the procedure were        explained to:  ___ patient  _x__ patient's family  ___ other:___________  who verbalized understanding and gave:  ___ verbal  ___ written  _x__ verbal and written  ___ telephone  ___ other:________ consent.      Unable to obtain consent; procedure performed on emergent basis.     Other:       PRESEDATION ASSESSMENT: History and Physical has been performed. Patient meds and allergies have been reviewed. Presedation airway examination has been performed and documented. Baseline vital signs, sedation score, oxygenation status, and cardiac rhythm were reviewed. Patient was deemed to be in satisfactory condition to undergo the procedure.    PREMEDICATIONS: SEE ANESTHESIOLOGY RECORDS   Insertion Route (Place X beside choice below)   Nasal   Oral  x Endotracheal Tube   Tracheostomy   INTRAPROCEDURE MEDICATIONS: SEE ANESTHESIOLOGY RECORDS   PROCEDURE DETAILS: Timeout performed and correct patient, name, & ID confirmed. Following prep per Pulmonary policy, appropriate sedation was administered.  I proceeded with introducing the endobronchial [place] scope and findings, technical procedures, and specimen collection as noted below. At the end of exam the scope was withdrawn without incident. Impression and Plan as noted below.   SPECIMENS (Sites): (Place X beside choice below)  Specimens Description   No Specimens Obtained     Washings    Lavage    Biopsies    Fine Needle Aspirates    Brushings    Sputum    FINDINGS: LARGE LUNG MASS WAS VISUALIZED HOWEVER,THERE WAS EXTENSIVE BLOOD VESSELS SEEN SURROUNDING THE MASS AND I WAS NOT ABLE TO ADVANCE NEEDLE.  ESTIMATED BLOOD LOSS:  none COMPLICATIONS/RESOLUTION: none       IMPRESSION:POST-PROCEDURE DX: lung mass    RECOMMENDATION/PLAN:  Follow up Pathology Reports     Corrin Parker, M.D.  [facility]  Medical Director [place] Director [place] Department

## 2018-05-02 NOTE — Discharge Instructions (Signed)
Flexible Bronchoscopy, Care After These instructions give you information on caring for yourself after your procedure. Your doctor may also give you more specific instructions. Call your doctor if you have any problems or questions after your procedure. Follow these instructions at home:  Do not eat or drink anything for 2 hours after your procedure. If you try to eat or drink before the medicine wears off, food or drink could go into your lungs. You could also burn yourself.  After 2 hours have passed and when you can cough and gag normally, you may eat soft food and drink liquids slowly.  The day after the test, you may eat your normal diet.  You may do your normal activities.  Keep all doctor visits. Get help right away if:  You get more and more short of breath.  You get light-headed.  You feel like you are going to pass out (faint).  You have chest pain.  You have new problems that worry you.  You cough up more than a little blood.  You cough up more blood than before. This information is not intended to replace advice given to you by your health care provider. Make sure you discuss any questions you have with your health care provider. Document Released: 03/06/2009 Document Revised: 10/15/2015 Document Reviewed: 01/11/2013 Elsevier Interactive Patient Education  2017 [place]   1) The drugs that you were given will stay in your system until tomorrow so for the next 24 hours you should not:  A) Drive an automobile B) Make any legal decisions C) Drink any alcoholic beverage   2) You may resume regular meals tomorrow.  Today it is better to start with liquids and gradually work up to solid foods.  You may eat anything you prefer, but it is better to start with liquids, then soup and crackers, and gradually work up to solid foods.   3) Please notify your doctor immediately if you have any unusual bleeding, trouble  breathing, redness and pain at the surgery site, drainage, fever, or pain not relieved by medication.    4) Additional Instructions:        Please contact your physician with any problems or Same Day Surgery at [phone], Monday through Friday 6 am to 4 pm, or [place] at [facility] number at [phone].

## 2018-05-02 NOTE — Anesthesia Postprocedure Evaluation (Signed)
Anesthesia Post Note  Patient: Misty Combs  Procedure(s) Performed: ELECTROMAGNETIC NAVIGATION BRONCHOSCOPY (Left )  Patient location during evaluation: PACU Anesthesia Type: General Level of consciousness: awake and alert Pain management: pain level controlled Vital Signs Assessment: post-procedure vital signs reviewed and stable Respiratory status: spontaneous breathing and respiratory function stable Cardiovascular status: stable Anesthetic complications: no     Last Vitals:  Vitals:   05/02/18 1433 05/02/18 1448  BP: 116/77 127/81  Pulse: 88 88  Resp: (!) 23 20  Temp:    SpO2: 95% 97%    Last Pain:  Vitals:   05/02/18 1433  TempSrc:   PainSc: 0-No pain                 Amarie Viles K

## 2018-05-02 NOTE — Op Note (Signed)
Electromagnetic Navigation Bronchoscopy: Indication: lung mass  Preoperative Diagnosis:lung mass Post Procedure Diagnosis:lung mass Consent: Verbal/Written  The Risks and Benefits of the procedure explained to patient/family prior to start of procedure and I have discussed the risk for acute bleeding, increased chance of infection, increased chance of respiratory failure and cardiac arrest and death.  I have also explained to avoid all types of NSAIDs to decrease chance of bleeding, and to avoid food and drinks the midnight prior to procedure.  The procedure consists of a video camera with a light source to be placed and inserted  into the lungs to  look for abnormal tissue and to obtain tissue samples by using needle and biopsy tools.  The patient/family understand the risks and benefits and have agreed to proceed with procedure.   Hand washing performed prior to starting the procedure.   Type of Anesthesia: see Anesthesiology records .   Procedure Performed:  Virtual Bronchoscopy with Multi-planar Image analysis, 3-D reconstruction of coronal, sagittal and multi-planar images for the purposes of planning real-time bronchoscopy using the iLogic Electromagnetic Navigation Bronchoscopy System (superDimension).  Description of Procedure: After obtaining informed consent from the patient, the above sedative and anesthetic measures were carried out, flexible fiberoptic bronchoscope was inserted via Endotracheal tube after patient was intubated by CNA/Anesthesiologist.   The virtual camera was then placed into the central portion of the trachea. The trachea itself was inspected.  The main carina, right and left midstem bronchus and all the segmental and subsegmental airways by virtual bronchoscopy were inspected. The camera was directed to standard registration points at the following centers: main carina, right upper lobe bronchus, right lower lobe bronchus, right middle lobe bronchus, left  upper lobe bronchus, and the left lower lobe bronchus. This data was transferred to the i-Logic ENB system for real-time bronchoscopy.   The scope was then navigated to the LUL for tissue sampling  Specimans Obtained:  Transbronchial Fine Needle Aspirations 21G times:7    Fluoroscopy:  Fluoroscopy was utilized during the course of this procedure to assure that biopsies were taken in a safe manner under fluoroscopic guidance with spot films required.   Complications:None  Estimated Blood Loss: minimal approx 1cc  Monitoring:  The patient was monitored with continuous oximetry and received supplemental nasal cannula oxygen throughout the procedure. In addition, serial blood pressure measurements and continuous electrocardiography showed these physiologic parameters to remain tolerable throughout the procedure.   Assessment and Plan/Additional Comments: Follow up Pathology Reports    Corrin Parker, M.D.  [facility]  Medical Director [place] Director [facility] Cardio-Pulmonary Department

## 2018-05-02 NOTE — Addendum Note (Signed)
Addended by: Flora Lipps on: 05/02/2018 03:41 PM   Modules accepted: Orders

## 2018-05-02 NOTE — Interval H&P Note (Signed)
History and Physical Interval Note:  05/02/2018 12:21 PM  Misty Combs  has presented today for surgery, with the diagnosis of mediastinal mass  The various methods of treatment have been discussed with the patient and family. After consideration of risks, benefits and other options for treatment, the patient has consented to  Procedure(s): ELECTROMAGNETIC NAVIGATION BRONCHOSCOPY (Left) as a surgical intervention .  The patient's history has been reviewed, patient examined, no change in status, stable for surgery.  I have reviewed the patient's chart and labs.  Questions were answered to the patient's satisfaction.      The Risks and Benefits of the Bronchoscopy procedure with ENB, EBUS and conventional Broncshcopy were explained to patient/family.  I have discussed the risk for Acute Bleeding, increased chance of Infection, increased chance of Respiratory Failure and Cardiac Arrest, increased chance of pneumothorax and collapsed lung, as well as increased Stroke and Death.  I have also explained to avoid all types of NSAIDs 1 week prior to procedure date  to decrease chance of bleeding, and also to avoid food and drinks the midnight prior to procedure.  The procedure consists of a video camera with a light source to be placed and inserted  into the lungs to  look for abnormal tissue and to obtain tissue samples by using needle and biopsy tools.  The patient/family understand the risks and benefits and have agreed to proceed with procedure.   Flora Lipps

## 2018-05-02 NOTE — Anesthesia Post-op Follow-up Note (Signed)
Anesthesia QCDR form completed.        

## 2018-05-03 ENCOUNTER — Other Ambulatory Visit: Payer: Self-pay | Admitting: Radiology

## 2018-05-03 ENCOUNTER — Encounter: Payer: Self-pay | Admitting: Internal Medicine

## 2018-05-03 LAB — CYTOLOGY - NON PAP

## 2018-05-04 ENCOUNTER — Other Ambulatory Visit: Payer: Self-pay

## 2018-05-04 ENCOUNTER — Ambulatory Visit
Admission: RE | Admit: 2018-05-04 | Discharge: 2018-05-04 | Disposition: A | Discharge status: Home / Self Care | Payer: Commercial Managed Care - PPO | Source: Ambulatory Visit | Attending: Internal Medicine | Admitting: Internal Medicine

## 2018-05-04 ENCOUNTER — Ambulatory Visit
Admission: RE | Admit: 2018-05-04 | Discharge: 2018-05-04 | Disposition: A | Discharge status: Home / Self Care | Payer: Commercial Managed Care - PPO | Source: Ambulatory Visit | Attending: Interventional Radiology | Admitting: Interventional Radiology

## 2018-05-04 ENCOUNTER — Encounter: Payer: Self-pay | Admitting: Internal Medicine

## 2018-05-04 DIAGNOSIS — J95811 Postprocedural pneumothorax: Secondary | ICD-10-CM | POA: Insufficient documentation

## 2018-05-04 DIAGNOSIS — R918 Other nonspecific abnormal finding of lung field: Secondary | ICD-10-CM

## 2018-05-04 DIAGNOSIS — C383 Malignant neoplasm of mediastinum, part unspecified: Secondary | ICD-10-CM | POA: Diagnosis not present

## 2018-05-04 DIAGNOSIS — J939 Pneumothorax, unspecified: Secondary | ICD-10-CM

## 2018-05-04 LAB — CBC WITH DIFFERENTIAL/PLATELET
ABS IMMATURE GRANULOCYTES: 0.02 10*3/uL (ref 0.00–0.07)
Basophils Absolute: 0 10*3/uL (ref 0.0–0.1)
Basophils Relative: 1 %
Eosinophils Absolute: 0.2 10*3/uL (ref 0.0–0.5)
Eosinophils Relative: 4 %
HCT: 38.2 % (ref 36.0–46.0)
Hemoglobin: 12.3 g/dL (ref 12.0–15.0)
Immature Granulocytes: 0 %
LYMPHS ABS: 1 10*3/uL (ref 0.7–4.0)
LYMPHS PCT: 15 %
MCH: 28.4 pg (ref 26.0–34.0)
MCHC: 32.2 g/dL (ref 30.0–36.0)
MCV: 88.2 fL (ref 80.0–100.0)
Monocytes Absolute: 0.4 10*3/uL (ref 0.1–1.0)
Monocytes Relative: 6 %
NEUTROS ABS: 5 10*3/uL (ref 1.7–7.7)
Neutrophils Relative %: 74 %
Platelets: 306 10*3/uL (ref 150–400)
RBC: 4.33 MIL/uL (ref 3.87–5.11)
RDW: 12.5 % (ref 11.5–15.5)
WBC: 6.7 10*3/uL (ref 4.0–10.5)
nRBC: 0 % (ref 0.0–0.2)

## 2018-05-04 LAB — PREGNANCY, URINE: Preg Test, Ur: NEGATIVE

## 2018-05-04 LAB — PROTIME-INR
INR: 1.07
Prothrombin Time: 13.8 seconds (ref 11.4–15.2)

## 2018-05-04 MED ORDER — FENTANYL CITRATE (PF) 100 MCG/2ML IJ SOLN
INTRAMUSCULAR | Status: AC | PRN
  Administered 2018-05-04 (×3): 50 ug via INTRAVENOUS

## 2018-05-04 MED ORDER — MIDAZOLAM HCL 2 MG/2ML IJ SOLN
INTRAMUSCULAR | Status: AC | PRN
  Administered 2018-05-04 (×3): 1 mg via INTRAVENOUS

## 2018-05-04 MED ORDER — HYDROCODONE-ACETAMINOPHEN 5-325 MG PO TABS
1.0000 | ORAL_TABLET | ORAL | Status: DC | PRN
  Filled 2018-05-04: qty 2

## 2018-05-04 MED ORDER — MIDAZOLAM HCL 5 MG/5ML IJ SOLN
INTRAMUSCULAR | Status: AC
  Filled 2018-05-04: qty 5

## 2018-05-04 MED ORDER — SODIUM CHLORIDE 0.9 % IV SOLN
INTRAVENOUS | Status: DC
  Administered 2018-05-04: 11:00:00 via INTRAVENOUS

## 2018-05-04 MED ORDER — LIDOCAINE HCL (PF) 1 % IJ SOLN
INTRAMUSCULAR | Status: AC | PRN
  Administered 2018-05-04: 10 mL

## 2018-05-04 MED ORDER — FENTANYL CITRATE (PF) 100 MCG/2ML IJ SOLN
INTRAMUSCULAR | Status: AC
  Filled 2018-05-04: qty 4

## 2018-05-04 NOTE — Procedures (Signed)
  Procedure: CT core biopsy ant mediast mass 18g x4 in saline EBL:   minimal Complications:  none immediate  See full dictation in [facility].  Dillard Cannon MD Main # [phone] Pager  [phone]

## 2018-05-07 ENCOUNTER — Telehealth: Payer: Self-pay

## 2018-05-07 NOTE — Telephone Encounter (Signed)
Spoke to Dr. Mortimer Fries, he talked to Dr. Vernard Gambles on 05/04/18.

## 2018-05-07 NOTE — Telephone Encounter (Signed)
-----   Message from Clarisse Gouge sent at 05/04/2018  1:15 PM EST ----- Regarding: Dr. Vernard Gambles calling to discuss Please call to discuss results .    [phone]

## 2018-05-08 ENCOUNTER — Inpatient Hospital Stay: Payer: Commercial Managed Care - PPO | Attending: Oncology | Admitting: Oncology

## 2018-05-08 ENCOUNTER — Institutional Professional Consult (permissible substitution): Payer: Commercial Managed Care - PPO | Admitting: Pulmonary Disease

## 2018-05-08 ENCOUNTER — Encounter: Payer: Self-pay | Admitting: Oncology

## 2018-05-08 ENCOUNTER — Other Ambulatory Visit: Payer: Self-pay

## 2018-05-08 VITALS — BP 122/91 | HR 96 | Temp 98.4°F | Resp 18 | Wt 209.8 lb

## 2018-05-08 DIAGNOSIS — C8590 Non-Hodgkin lymphoma, unspecified, unspecified site: Secondary | ICD-10-CM

## 2018-05-08 DIAGNOSIS — C8192 Hodgkin lymphoma, unspecified, intrathoracic lymph nodes: Secondary | ICD-10-CM | POA: Diagnosis present

## 2018-05-08 DIAGNOSIS — Z5111 Encounter for antineoplastic chemotherapy: Secondary | ICD-10-CM | POA: Diagnosis not present

## 2018-05-08 DIAGNOSIS — R222 Localized swelling, mass and lump, trunk: Secondary | ICD-10-CM | POA: Diagnosis not present

## 2018-05-08 DIAGNOSIS — R21 Rash and other nonspecific skin eruption: Secondary | ICD-10-CM | POA: Insufficient documentation

## 2018-05-08 NOTE — Progress Notes (Signed)
Patient here today for initial evaluation regarding lymphoma, referred by Dr. Mortimer Fries.

## 2018-05-08 NOTE — Progress Notes (Signed)
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  INTERVAL HISTORY: Patient is a 32 year old female who noticed increasing chest pain and shortness of breath over the past several weeks.  Subsequent CT scan and biopsy revealed a large mediastinal mass with preliminary results consistent with Hodgkin's lymphoma.  Final pathology results are pending.  Currently, she is anxious but otherwise feels well.  She has no neurologic complaints.  She denies any fevers, chills, night sweats, or weight loss.  She denies any further chest pain or shortness of breath.  She has no cough or hemoptysis.  She denies any nausea, vomiting, constipation, or diarrhea.  She has no urinary complaints.  Patient offers no further specific complaints today.  REVIEW OF SYSTEMS:   Review of Systems  Constitutional: Negative.  Negative for fever, malaise/fatigue and weight loss.  Respiratory: Negative for cough and shortness of breath.   Cardiovascular: Negative for chest pain and leg swelling.  Gastrointestinal: Negative.  Negative for abdominal pain, blood in stool and melena.  Genitourinary: Negative.  Negative for dysuria and flank pain.  Musculoskeletal: Negative.  Negative for back pain.  Skin: Negative.  Negative for rash.  Neurological: Negative.  Negative for focal weakness, weakness and headaches.  Psychiatric/Behavioral: The patient is nervous/anxious.     As per HPI. Otherwise, a complete review of systems is negative.  PAST MEDICAL HISTORY: Past Medical History:  Diagnosis Date  . Asthma    exercise induced  . Migraines    no aura  . UTI (lower urinary tract infection)    post coital    PAST SURGICAL HISTORY: Past Surgical History:  Procedure  Laterality Date  . ELECTROMAGNETIC NAVIGATION BROCHOSCOPY Left 05/02/2018   Procedure: ELECTROMAGNETIC NAVIGATION BRONCHOSCOPY;  Surgeon: Flora Lipps, MD;  Location: [facility] ORS;  Service: Cardiopulmonary;  Laterality: Left;  . ESOPHAGOGASTRODUODENOSCOPY    . KNEE SURGERY Left 2016    FAMILY HISTORY: Family History  Problem Relation Age of Onset  . Hypertension Mother   . Heart disease Father     ADVANCED DIRECTIVES (Y/N):  N  HEALTH MAINTENANCE: Social History   Tobacco Use  . Smoking status: Never Smoker  . Smokeless tobacco: Never Used  Substance Use Topics  . Alcohol use: Yes    Alcohol/week: 2.0 standard drinks    Types: 2 Glasses of wine per week  . Drug use: No     Colonoscopy:  PAP:  Bone density:  Lipid panel:  Allergies  Allergen Reactions  . Penicillins Anaphylaxis  . Other     allergic to cats  . Sulfur Nausea And Vomiting    Dizzy  . Tetanus Toxoids Swelling    Fever and swelling  . Gluten Meal Rash    Sensitive to gluten    Current Outpatient Medications  Medication Sig Dispense Refill  . albuterol (PROVENTIL HFA;VENTOLIN HFA) 108 (90 BASE) MCG/ACT inhaler Inhale into the lungs every 6 (six) hours as needed for wheezing or shortness of breath.    Marland Kitchen amitriptyline (ELAVIL) 25 MG tablet Take 25 mg by mouth at bedtime.   1  . azelastine (ASTELIN) 0.1 % nasal spray Place 2 sprays into both nostrils 2 (two) times daily. Use in each nostril as directed     . Bepotastine Besilate (BEPREVE) 1.5 % SOLN Apply 1 drop  to eye 2 (two) times daily.     . budesonide (PULMICORT) 0.5 MG/2ML nebulizer solution USE 2 MLS (0.5 MG TOTAL) BY NEBULIZATION 2 (TWO) TIMES DAILY. NASAL RINSE    . dicyclomine (BENTYL) 20 MG tablet Take 20 mg by mouth 4 (four) times daily as needed for spasms.   1  . fluticasone furoate-vilanterol (BREO ELLIPTA) 100-25 MCG/INH AEPB Inhale 1 puff into the lungs daily.     Marland Kitchen HYDROcodone-acetaminophen (NORCO/VICODIN) 5-325 MG tablet Take 1 tablet by  mouth every 6 (six) hours as needed for moderate pain or severe pain. 20 tablet 0  . levocetirizine (XYZAL) 5 MG tablet Take 5 mg by mouth daily.    . Prenatal Vit-Fe Fumarate-FA (PRENATAL VITAMIN PO) Take 1 tablet by mouth daily.      No current facility-administered medications for this visit.     OBJECTIVE: Vitals:   05/08/18 0946  BP: (!) 122/91  Pulse: 96  Resp: 18  Temp: 98.4 F (36.9 C)  SpO2: 96%     Body mass index is 34.91 kg/m.    ECOG FS:0 - Asymptomatic  General: Well-developed, well-nourished, no acute distress. Eyes: Pink conjunctiva, anicteric sclera. HEENT: Normocephalic, moist mucous membranes, clear oropharnyx. Lungs: Clear to auscultation bilaterally. Heart: Regular rate and rhythm. No rubs, murmurs, or gallops. Abdomen: Soft, nontender, nondistended. No organomegaly noted, normoactive bowel sounds. Musculoskeletal: No edema, cyanosis, or clubbing. Neuro: Alert, answering all questions appropriately. Cranial nerves grossly intact. Skin: No rashes or petechiae noted. Psych: Normal affect. Lymphatics: No cervical, calvicular, axillary or inguinal LAD.   LAB RESULTS:  Lab Results  Component Value Date   NA 140 04/27/2018   K 3.8 04/27/2018   CL 109 04/27/2018   CO2 22 04/27/2018   GLUCOSE 153 (H) 04/27/2018   BUN 12 04/27/2018   CREATININE 0.74 04/27/2018   CALCIUM 9.0 04/27/2018   PROT 7.8 04/27/2018   ALBUMIN 4.0 04/27/2018   AST 16 04/27/2018   ALT 25 04/27/2018   ALKPHOS 90 04/27/2018   BILITOT 0.5 04/27/2018   GFRNONAA >60 04/27/2018   GFRAA >60 04/27/2018    Lab Results  Component Value Date   WBC 6.7 05/04/2018   NEUTROABS 5.0 05/04/2018   HGB 12.3 05/04/2018   HCT 38.2 05/04/2018   MCV 88.2 05/04/2018   PLT 306 05/04/2018     STUDIES: Dg Chest 1 View  Result Date: 05/04/2018 CLINICAL DATA:  Status post biopsy EXAM: CHEST  1 VIEW COMPARISON:  Chest CT April 27, 2018 FINDINGS: There is a small left apical pneumothorax  without tension component. There is again noted a large mass arising in the left upper lobe medially. Lungs elsewhere clear. Heart size and pulmonary vascularity normal. No adenopathy. No bone lesions. IMPRESSION: Small left apical pneumothorax without tension component. Large mass left upper lobe medially. Lungs elsewhere clear. Stable cardiac silhouette. Critical Value/emergent results were called by telephone at the time of interpretation on 05/04/2018 at 1:17 pm to Dr. Arne Cleveland , who verbally acknowledged these results. Electronically Signed   By: Lowella Grip III M.D.   On: 05/04/2018 13:17   Dg Chest 2 View  Result Date: 04/27/2018 CLINICAL DATA:  32 year old female with pleuritic left chest pain radiating into the shoulder. EXAM: CHEST - 2 VIEW COMPARISON:  Prior chest x-ray 08/10/2011 FINDINGS: Focal curvilinear airspace opacity in the medial aspect of the left upper lobe overlying the AP window on the frontal view. There is associated volume loss with slight elevation of the left hemidiaphragm  compared to prior imaging. Otherwise, the cardiac and mediastinal contours are within normal limits. The right lung is clear. The visualized upper abdominal bowel gas pattern is normal. IMPRESSION: Focal curvilinear airspace opacity in the medial aspect of the left upper lobe overlying the AP window on the frontal view with evidence of volume loss in the left hemithorax. Differential considerations include focal atelectasis, pneumonia, and central obstructing lesion such as endobronchial carcinoid. Recommend further evaluation with CT scan of the chest. Electronically Signed   By: Jacqulynn Cadet M.D.   On: 04/27/2018 15:30   Ct Angio Chest Pe W And/or Wo Contrast  Result Date: 04/27/2018 CLINICAL DATA:  Left-sided chest pain yesterday worsening today with pain radiating to the left shoulder. EXAM: CT ANGIOGRAPHY CHEST WITH CONTRAST TECHNIQUE: Multidetector CT imaging of the chest was performed  using the standard protocol during bolus administration of intravenous contrast. Multiplanar CT image reconstructions and MIPs were obtained to evaluate the vascular anatomy. CONTRAST:  115m ISOVUE-370 IOPAMIDOL (ISOVUE-370) INJECTION 76% COMPARISON:  Same day CXR FINDINGS: Cardiovascular: The study is of quality for the evaluation of pulmonary embolism. There are no filling defects in the central, lobar, segmental or subsegmental pulmonary artery branches to suggest acute pulmonary embolism. Great vessels are normal in course and caliber. Normal heart size. No significant pericardial fluid/thickening. Mediastinum/Nodes: No discrete thyroid nodules or thyromegaly. Unremarkable esophagus. 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 This is causing extrinsic mass effect on the left upper lobe pulmonary artery. Smaller right paratracheal lymph node measuring 11 mm identified. Lungs/Pleura: No pneumothorax. Small left effusion is identified with atelectatic change adjacent to the described left superior mediastinal mass. Upper abdomen: Adrenal mass. The included liver demonstrates no enhancing lesions. No biliary dilatation is identified. The gallbladder is physiologically distended to the extent visualized without stones. Musculoskeletal:  No aggressive appearing focal osseous lesions. Review of the MIP images confirms the above findings. IMPRESSION: 1. 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 Adjacent atelectasis to the superior mediastinal mass is also noted. Findings are suspicious for possible lymphoma or an abutting left upper lobe primary bronchogenic neoplasm. Referral to pulmonology or cardiothoracic surgery for further disposition is recommended. 2.  Extrinsic mass-effect from the mediastinal mass on the left upper lobe pulmonary artery is noted without occlusion. No acute pulmonary embolus. 3. Nonaneurysmal thoracic aorta without dissection. 4. No thyromegaly or mass. Electronically Signed   By: DAshley RoyaltyM.D.   On: 04/27/2018 19:14   Ct Biopsy  Result Date: 05/04/2018 CLINICAL DATA:  Anterior mediastinal mass. No known primary malignancy. Nondiagnostic bronchoscopic biopsy 05/02/2018. EXAM: CT GUIDED CORE BIOPSY OF  MEDIASTINAL MASS ANESTHESIA/SEDATION: Intravenous Fentanyl and Versed were administered as conscious sedation during continuous monitoring of the patient?s level of consciousness and physiological / cardiorespiratory status by the radiology RN, with a total moderate sedation time of 14 minutes. PROCEDURE: The procedure risks, benefits, and alternatives were explained to the patient. Questions regarding the procedure were encouraged and answered. The patient understands and consents to the procedure. Select axial scans of the chest performed with overlying marker. An appropriate skin entry site was determined and marked. The operative field was prepped with chlorhexidinein a sterile fashion, and a sterile drape was applied covering the operative field. A  sterile gown and sterile gloves were used for the procedure. Local anesthesia was provided with 1% Lidocaine. Under CT fluoroscopic guidance, a 17 gauge trocar needle was advanced to the margin of the lesion. Once needle tip position was confirmed, coaxial 18-gauge core biopsy samples were obtained, submitted in saline to surgical pathology. The guide needle was removed. Postprocedure scans show no hemorrhage, or other apparent complication. Stable small left pneumothorax compared to preprocedure imaging. Stable small left pleural effusion. Patient tolerated procedure well. COMPLICATIONS: None immediate FINDINGS: Anterior mediastinal mass was again localized. Little change from prior CT.  Small left pneumothorax was present on the preprocedure CT. Representative core biopsy samples obtained as above. No apparent complication. IMPRESSION: 1. Technically successful CT-guided core biopsy, anterior mediastinal mass. 2. Small left pneumothorax incidentally noted on the preprocedure scan, stable post biopsy. Electronically Signed   By: Lucrezia Europe M.D.   On: 05/04/2018 13:24   Dg C-arm 1-60 Min-no Report  Result Date: 05/02/2018 Fluoroscopy was utilized by the requesting physician.  No radiographic interpretation.    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  PLAN:    1.  Large mediastinal mass: CT scan results from April 27, 2018 reviewed independently and reported as above.  CT-guided biopsy revealed lymphoma, most likely Hodgkin's.  Final pathology is pending at time of dictation.  Given her diagnosis of lymphoma, patient will require PET scan as well as a bone marrow biopsy to complete the staging work-up.  Given the bulky nature of her disease, she will also require chemotherapy therefore will order MUGA scan and PFTs prior to initiating treatment.  Patient also will require port placement.  Once her biopsy and imaging studies are complete, patient will return to clinic in approximately 1 to 2 weeks to discuss her final pathology results and treatment planning.  I spent a total of 60 minutes face-to-face with the patient of which greater than 50% of the visit was spent in counseling and coordination of care as detailed above.  Patient expressed understanding and was in agreement with this plan. She also understands that She can call clinic at any time with any questions, concerns, or complaints.   Cancer Staging No matching staging information was found for the patient.  Lloyd Huger, MD   05/08/2018 2:08 PM

## 2018-05-09 ENCOUNTER — Other Ambulatory Visit: Payer: Self-pay | Admitting: *Deleted

## 2018-05-09 ENCOUNTER — Encounter: Payer: Self-pay | Admitting: Oncology

## 2018-05-09 DIAGNOSIS — C8590 Non-Hodgkin lymphoma, unspecified, unspecified site: Secondary | ICD-10-CM

## 2018-05-10 ENCOUNTER — Other Ambulatory Visit: Payer: Commercial Managed Care - PPO

## 2018-05-10 ENCOUNTER — Encounter: Payer: Self-pay | Admitting: Interventional Radiology

## 2018-05-10 ENCOUNTER — Ambulatory Visit
Admission: RE | Admit: 2018-05-10 | Discharge: 2018-05-10 | Disposition: A | Discharge status: Home / Self Care | Payer: Commercial Managed Care - PPO | Source: Ambulatory Visit | Attending: Oncology | Admitting: Oncology

## 2018-05-10 ENCOUNTER — Ambulatory Visit (HOSPITAL_COMMUNITY): Payer: Commercial Managed Care - PPO

## 2018-05-10 DIAGNOSIS — C8590 Non-Hodgkin lymphoma, unspecified, unspecified site: Secondary | ICD-10-CM | POA: Diagnosis not present

## 2018-05-10 DIAGNOSIS — R590 Localized enlarged lymph nodes: Secondary | ICD-10-CM | POA: Diagnosis not present

## 2018-05-10 LAB — SURGICAL PATHOLOGY

## 2018-05-10 LAB — GLUCOSE, CAPILLARY: Glucose-Capillary: 81 mg/dL (ref 70–99)

## 2018-05-10 MED ORDER — FLUDEOXYGLUCOSE F - 18 (FDG) INJECTION
10.8000 | Freq: Once | INTRAVENOUS | Status: AC | PRN
  Administered 2018-05-10: 10.7 via INTRAVENOUS

## 2018-05-10 MED ORDER — ALBUTEROL SULFATE (2.5 MG/3ML) 0.083% IN NEBU
2.5000 mg | INHALATION_SOLUTION | Freq: Once | RESPIRATORY_TRACT | Status: AC
  Administered 2018-05-10: 2.5 mg via RESPIRATORY_TRACT
  Filled 2018-05-10: qty 3

## 2018-05-10 NOTE — Progress Notes (Addendum)
Tumor Board Documentation  Sydna Brodowski was presented by Dr Grayland Ormond at our Tumor Board on 05/10/2018, which included representatives from medical oncology, radiation oncology, radiology, pathology, surgical, navigation, internal medicine, pulmonology, research, nutrition, genetics.  Lillyan currently presents as a new patient with history of the following treatments: surgical intervention(s) biopsy.  Additionally, we reviewed previous medical and familial history, history of present illness, and recent lab results along with all available histopathologic and imaging studies. The tumor board considered available treatment options and made the following recommendations: Adjuvant chemotherapy, Restage after 2 cycles, Possible radiation therapy in the future Bone Marrow Biopsy 05/14/18  The following procedures/referrals were also placed: No orders of the defined types were placed in this encounter.   Clinical Trial Status: not discussed   Staging used: AJCC Stage Group Stage 2 Lymphoma, suspect Hodgkin's, awaiting final pathology  National site-specific guidelines NCCN were discussed with respect to the case.  Tumor board is a meeting of clinicians from various specialty areas who evaluate and discuss patients for whom a multidisciplinary approach is being considered. Final determinations in the plan of care are those of the provider(s). The responsibility for follow up of recommendations given during tumor board is that of the provider.   Today's extended care, comprehensive team conference, [place] was not present for the discussion and was not examined.   Multidisciplinary Tumor Board is a multidisciplinary case peer review process.  Decisions discussed in the Multidisciplinary Tumor Board reflect the opinions of the specialists present at the conference without having examined the patient.  Ultimately, treatment and diagnostic decisions rest with the primary provider(s) and the  patient.

## 2018-05-11 ENCOUNTER — Ambulatory Visit
Admission: RE | Admit: 2018-05-11 | Discharge: 2018-05-11 | Disposition: A | Discharge status: Home / Self Care | Payer: Commercial Managed Care - PPO | Source: Ambulatory Visit | Attending: Oncology | Admitting: Oncology

## 2018-05-11 ENCOUNTER — Other Ambulatory Visit: Payer: Self-pay | Admitting: Radiology

## 2018-05-11 DIAGNOSIS — C8590 Non-Hodgkin lymphoma, unspecified, unspecified site: Secondary | ICD-10-CM | POA: Diagnosis not present

## 2018-05-11 MED ORDER — TECHNETIUM TC 99M-LABELED RED BLOOD CELLS IV KIT
20.0000 | PACK | Freq: Once | INTRAVENOUS | Status: AC | PRN
  Administered 2018-05-11: 22.769 via INTRAVENOUS

## 2018-05-13 ENCOUNTER — Other Ambulatory Visit: Payer: Self-pay | Admitting: Oncology

## 2018-05-13 DIAGNOSIS — C8190 Hodgkin lymphoma, unspecified, unspecified site: Secondary | ICD-10-CM | POA: Insufficient documentation

## 2018-05-13 NOTE — Progress Notes (Signed)
[place]  [phone] [phone]) [phone]  ID: Misty Combs OB: Oct 08, 1985  MR#: [id number]  AOZ#:[id number]  Patient Care Team: Gala Lewandowsky, MD as PCP - General (Family Medicine)  CHIEF COMPLAINT: Stage II Hodgkin's lymphoma.  INTERVAL HISTORY: Patient returns to clinic today to discuss her final diagnosis, results of imaging, and treatment planning.  She currently feels well and is asymptomatic.  She has no neurologic complaints.  She admits to low-grade fevers, but denies any chills, night sweats, or weight loss.  She denies any further chest pain or shortness of breath.  She has no cough or hemoptysis.  She denies any nausea, vomiting, constipation, or diarrhea.  She has no urinary complaints.  Patient offers no specific complaints today.  REVIEW OF SYSTEMS:   Review of Systems  Constitutional: Negative.  Negative for fever, malaise/fatigue and weight loss.  Respiratory: Negative for cough and shortness of breath.   Cardiovascular: Negative for chest pain and leg swelling.  Gastrointestinal: Negative.  Negative for abdominal pain, blood in stool and melena.  Genitourinary: Negative.  Negative for dysuria and flank pain.  Musculoskeletal: Negative.  Negative for back pain.  Skin: Negative.  Negative for rash.  Neurological: Negative.  Negative for focal weakness, weakness and headaches.  Psychiatric/Behavioral: The patient is nervous/anxious.     As per HPI. Otherwise, a complete review of systems is negative.  PAST MEDICAL HISTORY: Past Medical History:  Diagnosis Date  . Asthma    exercise induced  . Migraines    no aura  . UTI (lower urinary tract infection)    post coital    PAST SURGICAL HISTORY: Past Surgical History:  Procedure Laterality Date  . ELECTROMAGNETIC NAVIGATION BROCHOSCOPY Left 05/02/2018   Procedure: ELECTROMAGNETIC NAVIGATION BRONCHOSCOPY;  Surgeon: Flora Lipps, MD;  Location: [facility] ORS;  Service:  Cardiopulmonary;  Laterality: Left;  . ESOPHAGOGASTRODUODENOSCOPY    . IR IMAGING GUIDED PORT INSERTION  05/14/2018  . KNEE SURGERY Left 2016    FAMILY HISTORY: Family History  Problem Relation Age of Onset  . Hypertension Mother   . Heart disease Father     ADVANCED DIRECTIVES (Y/N):  N  HEALTH MAINTENANCE: Social History   Tobacco Use  . Smoking status: Never Smoker  . Smokeless tobacco: Never Used  Substance Use Topics  . Alcohol use: Yes    Alcohol/week: 2.0 standard drinks    Types: 2 Glasses of wine per week  . Drug use: No     Colonoscopy:  PAP:  Bone density:  Lipid panel:  Allergies  Allergen Reactions  . Penicillins Anaphylaxis  . Other     allergic to cats  . Sulfur Nausea And Vomiting    Dizzy  . Tetanus Toxoids Swelling    Fever and swelling  . Gluten Meal Rash    Sensitive to gluten    Current Outpatient Medications  Medication Sig Dispense Refill  . albuterol (PROVENTIL HFA;VENTOLIN HFA) 108 (90 BASE) MCG/ACT inhaler Inhale into the lungs every 6 (six) hours as needed for wheezing or shortness of breath.    Marland Kitchen amitriptyline (ELAVIL) 25 MG tablet Take 25 mg by mouth at bedtime.   1  . azelastine (ASTELIN) 0.1 % nasal spray Place 2 sprays into both nostrils 2 (two) times daily. Use in each nostril as directed     . azithromycin (ZITHROMAX) 250 MG tablet Take 1 tablet by mouth 1 day or 1 dose.    . Bepotastine Besilate (BEPREVE) 1.5 % SOLN Apply 1  drop to eye 2 (two) times daily.     . budesonide (PULMICORT) 0.5 MG/2ML nebulizer solution USE 2 MLS (0.5 MG TOTAL) BY NEBULIZATION 2 (TWO) TIMES DAILY. NASAL RINSE    . dicyclomine (BENTYL) 20 MG tablet Take 20 mg by mouth 4 (four) times daily as needed for spasms.   1  . fluconazole (DIFLUCAN) 150 MG tablet Take 1 tablet by mouth.    . fluticasone furoate-vilanterol (BREO ELLIPTA) 100-25 MCG/INH AEPB Inhale 1 puff into the lungs daily.     Marland Kitchen HYDROcodone-acetaminophen (NORCO/VICODIN) 5-325 MG tablet Take  1 tablet by mouth every 6 (six) hours as needed for moderate pain or severe pain. 20 tablet 0  . hydrOXYzine (VISTARIL) 25 MG capsule Take 1 capsule by mouth.    . levocetirizine (XYZAL) 5 MG tablet Take 5 mg by mouth daily.    . Prenatal Vit-Fe Fumarate-FA (PRENATAL VITAMIN PO) Take 1 tablet by mouth daily.     Marland Kitchen lidocaine-prilocaine (EMLA) cream Apply to affected area once 30 g 3  . ondansetron (ZOFRAN) 8 MG tablet Take 1 tablet (8 mg total) by mouth 2 (two) times daily as needed. 30 tablet 2  . prochlorperazine (COMPAZINE) 10 MG tablet Take 1 tablet (10 mg total) by mouth every 6 (six) hours as needed (Nausea or vomiting). 60 tablet 2   No current facility-administered medications for this visit.     OBJECTIVE: Vitals:   05/15/18 0743  BP: 128/82  Pulse: 90  Temp: (!) 97 F (36.1 C)     Body mass index is 34.61 kg/m.    ECOG FS:0 - Asymptomatic  General: Well-developed, well-nourished, no acute distress. Eyes: Pink conjunctiva, anicteric sclera. HEENT: Normocephalic, moist mucous membranes. Lungs: Clear to auscultation bilaterally. Heart: Regular rate and rhythm. No rubs, murmurs, or gallops. Abdomen: Soft, nontender, nondistended. No organomegaly noted, normoactive bowel sounds. Musculoskeletal: No edema, cyanosis, or clubbing. Neuro: Alert, answering all questions appropriately. Cranial nerves grossly intact. Skin: No rashes or petechiae noted. Psych: Normal affect. Lymphatics: Supraclavicular and axillary lymphadenopathy.  LAB RESULTS:  Lab Results  Component Value Date   NA 134 (L) 05/14/2018   K 3.6 05/14/2018   CL 104 05/14/2018   CO2 21 (L) 05/14/2018   GLUCOSE 100 (H) 05/14/2018   BUN 13 05/14/2018   CREATININE 0.78 05/14/2018   CALCIUM 8.8 (L) 05/14/2018   PROT 7.8 04/27/2018   ALBUMIN 4.0 04/27/2018   AST 16 04/27/2018   ALT 25 04/27/2018   ALKPHOS 90 04/27/2018   BILITOT 0.5 04/27/2018   GFRNONAA >60 05/14/2018   GFRAA >60 05/14/2018    Lab Results   Component Value Date   WBC 9.1 05/14/2018   NEUTROABS 7.4 05/14/2018   HGB 12.0 05/14/2018   HCT 35.9 (L) 05/14/2018   MCV 86.3 05/14/2018   PLT 278 05/14/2018     STUDIES: Dg Chest 1 View  Result Date: 05/04/2018 CLINICAL DATA:  Status post biopsy EXAM: CHEST  1 VIEW COMPARISON:  Chest CT April 27, 2018 FINDINGS: There is a small left apical pneumothorax without tension component. There is again noted a large mass arising in the left upper lobe medially. Lungs elsewhere clear. Heart size and pulmonary vascularity normal. No adenopathy. No bone lesions. IMPRESSION: Small left apical pneumothorax without tension component. Large mass left upper lobe medially. Lungs elsewhere clear. Stable cardiac silhouette. Critical Value/emergent results were called by telephone at the time of interpretation on 05/04/2018 at 1:17 pm to Dr. Arne Cleveland , who verbally acknowledged these  results. Electronically Signed   By: Lowella Grip III M.D.   On: 05/04/2018 13:17   Dg Chest 2 View  Result Date: 04/27/2018 CLINICAL DATA:  32 year old female with pleuritic left chest pain radiating into the shoulder. EXAM: CHEST - 2 VIEW COMPARISON:  Prior chest x-ray 08/10/2011 FINDINGS: Focal curvilinear airspace opacity in the medial aspect of the left upper lobe overlying the AP window on the frontal view. There is associated volume loss with slight elevation of the left hemidiaphragm compared to prior imaging. Otherwise, the cardiac and mediastinal contours are within normal limits. The right lung is clear. The visualized upper abdominal bowel gas pattern is normal. IMPRESSION: Focal curvilinear airspace opacity in the medial aspect of the left upper lobe overlying the AP window on the frontal view with evidence of volume loss in the left hemithorax. Differential considerations include focal atelectasis, pneumonia, and central obstructing lesion such as endobronchial carcinoid. Recommend further evaluation with CT  scan of the chest. Electronically Signed   By: Jacqulynn Cadet M.D.   On: 04/27/2018 15:30   Ct Angio Chest Pe W And/or Wo Contrast  Result Date: 04/27/2018 CLINICAL DATA:  Left-sided chest pain yesterday worsening today with pain radiating to the left shoulder. EXAM: CT ANGIOGRAPHY CHEST WITH CONTRAST TECHNIQUE: Multidetector CT imaging of the chest was performed using the standard protocol during bolus administration of intravenous contrast. Multiplanar CT image reconstructions and MIPs were obtained to evaluate the vascular anatomy. CONTRAST:  143m ISOVUE-370 IOPAMIDOL (ISOVUE-370) INJECTION 76% COMPARISON:  Same day CXR FINDINGS: Cardiovascular: The study is of quality for the evaluation of pulmonary embolism. There are no filling defects in the central, lobar, segmental or subsegmental pulmonary artery branches to suggest acute pulmonary embolism. Great vessels are normal in course and caliber. Normal heart size. No significant pericardial fluid/thickening. Mediastinum/Nodes: No discrete thyroid nodules or thyromegaly. Unremarkable esophagus. 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 This is causing extrinsic mass effect on the left upper lobe pulmonary artery. Smaller right paratracheal lymph node measuring 11 mm identified. Lungs/Pleura: No pneumothorax. Small left effusion is identified with atelectatic change adjacent to the described left superior mediastinal mass. Upper abdomen: Adrenal mass. The included liver demonstrates no enhancing lesions. No biliary dilatation is identified. The gallbladder is physiologically distended to the extent visualized without stones. Musculoskeletal:  No aggressive appearing focal osseous lesions. Review of the MIP images confirms the above findings. IMPRESSION: 1. Left-sided mediastinal mass centered in the region of the prevascular space and AP window  measuring 6 x 7.3 x 5 cm with left greater than right small pleural effusions and bibasilar atelectasis. Adjacent atelectasis to the superior mediastinal mass is also noted. Findings are suspicious for possible lymphoma or an abutting left upper lobe primary bronchogenic neoplasm. Referral to pulmonology or cardiothoracic surgery for further disposition is recommended. 2. Extrinsic mass-effect from the mediastinal mass on the left upper lobe pulmonary artery is noted without occlusion. No acute pulmonary embolus. 3. Nonaneurysmal thoracic aorta without dissection. 4. No thyromegaly or mass. Electronically Signed   By: DAshley RoyaltyM.D.   On: 04/27/2018 19:14   Nm Cardiac Muga Rest  Result Date: 05/11/2018 CLINICAL DATA:  Lymphoma cancer. Evaluate cardiac function in relation to chemotherapy. EXAM: NUCLEAR MEDICINE CARDIAC BLOOD POOL IMAGING (MUGA) TECHNIQUE: Cardiac multi-gated acquisition was performed at rest following intravenous injection of Tc-955mabeled red blood cells. RADIOPHARMACEUTICALS:  22.8 mCi  Tc-25mpertechnetate in-vitro labeled red blood cells IV COMPARISON:  None. FINDINGS: No  focal wall motion abnormality of the left ventricle. Calculated left ventricular ejection fraction equals 61% IMPRESSION: Left ventricular ejection fraction equals61 %. Electronically Signed   By: SSuzy BouchardM.D.   On: 05/11/2018 14:05   Nm Pet Image Initial (pi) Skull Base To Thigh  Result Date: 05/10/2018 CLINICAL DATA:  Initial treatment strategy for mediastinal lymphoma. EXAM: NUCLEAR MEDICINE PET SKULL BASE TO THIGH TECHNIQUE: 10.7 mCi F-18 FDG was injected intravenously. Full-ring PET imaging was performed from the skull base to thigh after the radiotracer. CT data was obtained and used for attenuation correction and anatomic localization. Fasting blood glucose: 981 mg/dl COMPARISON:  Chest CT 04/27/2018 FINDINGS: Mediastinal blood pool activity: SUV max 1.98 NECK: No neck mass or hypermetabolic  lymphadenopathy. There is a small subcutaneous nodule noted in the midline of the upper cervical spine posteriorly which measures 10 mm and has an SUV max of 4.09. It could be a subcutaneous lymph node. There is a similar finding more superiorly to the right of midline with SUV max of 3.5. Incidental CT findings: none CHEST: Small bilateral supraclavicular lymph nodes are noted. 6.5 mm left supraclavicular node on image number 63 has an SUV max of 5.05. Left-sided subpectoral nodes are hypermetabolic. The more lateral node measures 8 mm and has an SUV max of 6.65. Largest left axillary lymph node measures 14 mm on image number 76 and has an SUV max of 10.5. Large anterior mediastinal mass is markedly hypermetabolic and has an SUV max of 15.47. Right paratracheal lymph node measures 12 mm on image number 82 and has an SUV max of 9.45. No worrisome pulmonary nodules. There is a small left pleural effusion. There are 2 areas of hypermetabolism involving the right chest wall skin in the region of the right shoulder (CT image 84). No measurable lesions but SUV max is 4.7 posteriorly and 2.6 anteriorly. Incidental CT findings: none ABDOMEN/PELVIS: No abnormal hypermetabolic activity within the liver, pancreas, adrenal glands, or spleen. No hypermetabolic lymph nodes in the abdomen or pelvis. No splenomegaly. No pelvic mass or lymphadenopathy. No inguinal lymphadenopathy. Incidental CT findings: The uterus and ovaries are normal. SKELETON: No focal hypermetabolic activity to suggest skeletal metastasis. Incidental CT findings: none IMPRESSION: 1. Bulky anterior mediastinal mass is markedly hypermetabolic and consistent with known lymphoma. 2. Bilateral supraclavicular, left subpectoral and left axillary lymphadenopathy. 3. No findings for lymphoma below the diaphragm. 4. A few small vague skin/subcutaneous lesions are hypermetabolic but are of uncertain significance or etiology. Cutaneous/subcutaneous lymphoma is certainly  a possibility. Electronically Signed   By: PMarijo SanesM.D.   On: 05/10/2018 12:25   Ct Biopsy  Result Date: 05/04/2018 CLINICAL DATA:  Anterior mediastinal mass. No known primary malignancy. Nondiagnostic bronchoscopic biopsy 05/02/2018. EXAM: CT GUIDED CORE BIOPSY OF  MEDIASTINAL MASS ANESTHESIA/SEDATION: Intravenous Fentanyl and Versed were administered as conscious sedation during continuous monitoring of the patient?s level of consciousness and physiological / cardiorespiratory status by the radiology RN, with a total moderate sedation time of 14 minutes. PROCEDURE: The procedure risks, benefits, and alternatives were explained to the patient. Questions regarding the procedure were encouraged and answered. The patient understands and consents to the procedure. Select axial scans of the chest performed with overlying marker. An appropriate skin entry site was determined and marked. The operative field was prepped with chlorhexidinein a sterile fashion, and a sterile drape was applied covering the operative field. A sterile gown and  sterile gloves were used for the procedure. Local anesthesia was provided with 1% Lidocaine. Under CT fluoroscopic guidance, a 17 gauge trocar needle was advanced to the margin of the lesion. Once needle tip position was confirmed, coaxial 18-gauge core biopsy samples were obtained, submitted in saline to surgical pathology. The guide needle was removed. Postprocedure scans show no hemorrhage, or other apparent complication. Stable small left pneumothorax compared to preprocedure imaging. Stable small left pleural effusion. Patient tolerated procedure well. COMPLICATIONS: None immediate FINDINGS: Anterior mediastinal mass was again localized. Little change from prior CT. Small left pneumothorax was present on the preprocedure CT. Representative core biopsy samples obtained as above. No apparent complication. IMPRESSION: 1. Technically successful CT-guided core biopsy, anterior  mediastinal mass. 2. Small left pneumothorax incidentally noted on the preprocedure scan, stable post biopsy. Electronically Signed   By: Lucrezia Europe M.D.   On: 05/04/2018 13:24   Ct Bone Marrow Biopsy & Aspiration  Result Date: 05/14/2018 CLINICAL DATA:  Hodgkin lymphoma and need for bone marrow biopsy for staging prior to treatment. EXAM: CT GUIDED BONE MARROW ASPIRATION AND BIOPSY ANESTHESIA/SEDATION: Versed 4.0 mg IV, Fentanyl 75 mcg IV Total Moderate Sedation Time:   19 minutes. The patient's level of consciousness and physiologic status were continuously monitored during the procedure by Radiology nursing. PROCEDURE: The procedure risks, benefits, and alternatives were explained to the patient. Questions regarding the procedure were encouraged and answered. The patient understands and consents to the procedure. A time out was performed prior to initiating the procedure. The right gluteal region was prepped with chlorhexidine. Sterile gown and sterile gloves were used for the procedure. Local anesthesia was provided with 1% Lidocaine. Under CT guidance, an 11 gauge On Control bone cutting needle was advanced from a posterior approach into the right iliac bone. Needle positioning was confirmed with CT. Initial non heparinized and heparinized aspirate samples were obtained of bone marrow. Core biopsy was performed via the On Control drill needle. COMPLICATIONS: None FINDINGS: Inspection of initial aspirate did reveal visible particles. Intact core biopsy sample was obtained. IMPRESSION: CT guided bone marrow biopsy of right posterior iliac bone with both aspirate and core samples obtained. Electronically Signed   By: Aletta Edouard M.D.   On: 05/14/2018 11:38   Dg C-arm 1-60 Min-no Report  Result Date: 05/02/2018 Fluoroscopy was utilized by the requesting physician.  No radiographic interpretation.   Ir Imaging Guided Port Insertion  Result Date: 05/14/2018 CLINICAL DATA:  Hodgkin lymphoma and need  for porta cath to begin chemotherapy. EXAM: IMPLANTED PORT A CATH PLACEMENT WITH ULTRASOUND AND FLUOROSCOPIC GUIDANCE ANESTHESIA/SEDATION: 2.0 mg IV Versed; 75 mcg IV Fentanyl Total Moderate Sedation Time:  59 minutes The patient's level of consciousness and physiologic status were continuously monitored during the procedure by Radiology nursing. Additional Medications: 1 g IV vancomycin. FLUOROSCOPY TIME:  30 seconds.  7.0 mGy. PROCEDURE: The procedure, risks, benefits, and alternatives were explained to the patient. Questions regarding the procedure were encouraged and answered. The patient understands and consents to the procedure. A time-out was performed prior to initiating the procedure. Ultrasound was utilized to confirm patency of the right internal jugular vein. The right neck and chest were prepped with chlorhexidine in a sterile fashion, and a sterile drape was applied covering the operative field. Maximum barrier sterile technique with sterile gowns and gloves were used for the procedure. Local anesthesia was provided with 1% lidocaine. After creating a small venotomy incision, a 21 gauge needle was advanced into the right internal jugular  vein under direct, real-time ultrasound guidance. Ultrasound image documentation was performed. After securing guidewire access, an 8 Fr dilator was placed. A J-wire was kinked to measure appropriate catheter length. A subcutaneous port pocket was then created along the upper chest wall utilizing sharp and blunt dissection. Portable cautery was utilized. The pocket was irrigated with sterile saline. A single lumen power injectable port was chosen for placement. The 8 Fr catheter was tunneled from the port pocket site to the venotomy incision. The port was placed in the pocket. External catheter was trimmed to appropriate length based on guidewire measurement. At the venotomy, an 8 Fr peel-away sheath was placed over a guidewire. The catheter was then placed through the  sheath and the sheath removed. Final catheter positioning was confirmed and documented with a fluoroscopic spot image. The port was accessed with a needle and aspirated and flushed with heparinized saline. The access needle was removed. The venotomy and port pocket incisions were closed with subcutaneous 4-0 Monocryl and subcuticular 4-0 Monocryl. Dermabond was applied to both incisions. COMPLICATIONS: COMPLICATIONS None FINDINGS: After catheter placement, the tip lies at the cavo-atrial junction. The catheter aspirates normally and is ready for immediate use. IMPRESSION: Placement of single lumen port a cath via right internal jugular vein. The catheter tip lies at the cavo-atrial junction. A power injectable port a cath was placed and is ready for immediate use. Electronically Signed   By: Aletta Edouard M.D.   On: 05/14/2018 11:57    ASSESSMENT: Stage II Hodgkin's lymphoma.  PLAN:    1.  Stage II Hodgkin's lymphoma: CT scan results from April 27, 2018 reviewed independently and reported as above.  PET scan results from May 10, 2018 reviewed independently confirming stage II disease.  Results of her bone marrow biopsy are pending.  MUGA scan from May 11, 2018 revealed an EF of 61%.  PFTs are adequate for treatment.  Patient will return to clinic on Monday, May 21, 2018 to initiate cycle 1 of ABVD.  Patient received treatment every 2 weeks and will reimage after 4 treatments.  Given the size of her mediastinal mass, she may benefit from adjuvant XRT at the conclusion of chemotherapy.  Return to clinic on Monday for consideration of cycle 1.    I spent a total of 30 minutes face-to-face with the patient of which greater than 50% of the visit was spent in counseling and coordination of care as detailed above.  Patient expressed understanding and was in agreement with this plan. She also understands that She can call clinic at any time with any questions, concerns, or complaints.    Cancer Staging Hodgkin's lymphoma [facility]) Staging form: Hodgkin and Non-Hodgkin Lymphoma, AJCC 8th Edition - Clinical stage from 05/13/2018: Stage II bulky (Hodgkin lymphoma, A - Asymptomatic) - Signed by Lloyd Huger, MD on 05/13/2018   Lloyd Huger, MD   05/15/2018 10:11 AM

## 2018-05-13 NOTE — Progress Notes (Signed)
START ON PATHWAY REGIMEN - Lymphoma and CLL     A cycle is every 28 days:     Doxorubicin      Dacarbazine      Vinblastine      Bleomycin   **Always confirm dose/schedule in your pharmacy ordering system**  Patient Characteristics: Classical Hodgkin Lymphoma, First Line, Stage I / II, Early Favorable with  No Risk Factors  and No Bulk, Age < 22 Disease Type: Not Applicable Disease Type: Not Applicable Disease Type: Classical Hodgkin Lymphoma Line of therapy: First Line [place] Stage: IIA First Line, Stage I/II Disease Characteristics: Early Favorable with No Risk Factors and No Bulk Age: < 4 Intent of Therapy: Curative Intent, Discussed with Patient

## 2018-05-14 ENCOUNTER — Ambulatory Visit
Admission: RE | Admit: 2018-05-14 | Discharge: 2018-05-14 | Disposition: A | Discharge status: Home / Self Care | Payer: Commercial Managed Care - PPO | Source: Ambulatory Visit | Attending: Oncology | Admitting: Oncology

## 2018-05-14 ENCOUNTER — Other Ambulatory Visit (HOSPITAL_COMMUNITY)
Admission: RE | Admit: 2018-05-14 | Disposition: A | Discharge status: Home / Self Care | Payer: Commercial Managed Care - PPO | Source: Ambulatory Visit | Attending: Oncology | Admitting: Oncology

## 2018-05-14 ENCOUNTER — Other Ambulatory Visit: Payer: Self-pay

## 2018-05-14 DIAGNOSIS — J45909 Unspecified asthma, uncomplicated: Secondary | ICD-10-CM | POA: Insufficient documentation

## 2018-05-14 DIAGNOSIS — E669 Obesity, unspecified: Secondary | ICD-10-CM | POA: Diagnosis not present

## 2018-05-14 DIAGNOSIS — Z880 Allergy status to penicillin: Secondary | ICD-10-CM | POA: Insufficient documentation

## 2018-05-14 DIAGNOSIS — Z887 Allergy status to serum and vaccine status: Secondary | ICD-10-CM | POA: Diagnosis not present

## 2018-05-14 DIAGNOSIS — C8590 Non-Hodgkin lymphoma, unspecified, unspecified site: Secondary | ICD-10-CM

## 2018-05-14 DIAGNOSIS — C8172 Other classical Hodgkin lymphoma, intrathoracic lymph nodes: Secondary | ICD-10-CM | POA: Diagnosis not present

## 2018-05-14 DIAGNOSIS — Z882 Allergy status to sulfonamides status: Secondary | ICD-10-CM | POA: Insufficient documentation

## 2018-05-14 DIAGNOSIS — Z7951 Long term (current) use of inhaled steroids: Secondary | ICD-10-CM | POA: Insufficient documentation

## 2018-05-14 DIAGNOSIS — Z6834 Body mass index (BMI) 34.0-34.9, adult: Secondary | ICD-10-CM | POA: Insufficient documentation

## 2018-05-14 HISTORY — PX: IR IMAGING GUIDED PORT INSERTION: IMG5740

## 2018-05-14 LAB — CBC WITH DIFFERENTIAL/PLATELET
Abs Immature Granulocytes: 0.04 10*3/uL (ref 0.00–0.07)
Basophils Absolute: 0 10*3/uL (ref 0.0–0.1)
Basophils Relative: 0 %
Eosinophils Absolute: 0.3 10*3/uL (ref 0.0–0.5)
Eosinophils Relative: 3 %
HCT: 35.9 % — ABNORMAL LOW (ref 36.0–46.0)
Hemoglobin: 12 g/dL (ref 12.0–15.0)
Immature Granulocytes: 0 %
LYMPHS ABS: 0.8 10*3/uL (ref 0.7–4.0)
Lymphocytes Relative: 9 %
MCH: 28.8 pg (ref 26.0–34.0)
MCHC: 33.4 g/dL (ref 30.0–36.0)
MCV: 86.3 fL (ref 80.0–100.0)
Monocytes Absolute: 0.6 10*3/uL (ref 0.1–1.0)
Monocytes Relative: 6 %
Neutro Abs: 7.4 10*3/uL (ref 1.7–7.7)
Neutrophils Relative %: 82 %
Platelets: 278 10*3/uL (ref 150–400)
RBC: 4.16 MIL/uL (ref 3.87–5.11)
RDW: 12.6 % (ref 11.5–15.5)
WBC: 9.1 10*3/uL (ref 4.0–10.5)
nRBC: 0 % (ref 0.0–0.2)

## 2018-05-14 LAB — BASIC METABOLIC PANEL
Anion gap: 9 (ref 5–15)
BUN: 13 mg/dL (ref 6–20)
CALCIUM: 8.8 mg/dL — AB (ref 8.9–10.3)
CO2: 21 mmol/L — ABNORMAL LOW (ref 22–32)
Chloride: 104 mmol/L (ref 98–111)
Creatinine, Ser: 0.78 mg/dL (ref 0.44–1.00)
GFR calc Af Amer: >60 mL/min (ref >60)
GFR calc non Af Amer: >60 mL/min (ref >60)
Glucose, Bld: 100 mg/dL — ABNORMAL HIGH (ref 70–99)
Potassium: 3.6 mmol/L (ref 3.5–5.1)
Sodium: 134 mmol/L — ABNORMAL LOW (ref 135–145)

## 2018-05-14 LAB — PROTIME-INR
INR: 1.02
Prothrombin Time: 13.3 seconds (ref 11.4–15.2)

## 2018-05-14 MED ORDER — MIDAZOLAM HCL 5 MG/5ML IJ SOLN
INTRAMUSCULAR | Status: AC | PRN
  Administered 2018-05-14: 0.5 mg via INTRAVENOUS
  Administered 2018-05-14: 1 mg via INTRAVENOUS
  Administered 2018-05-14 (×2): 0.5 mg via INTRAVENOUS

## 2018-05-14 MED ORDER — HEPARIN (PORCINE) IN NACL 1000-0.9 UT/500ML-% IV SOLN
INTRAVENOUS | Status: AC
  Filled 2018-05-14: qty 500

## 2018-05-14 MED ORDER — MIDAZOLAM HCL 5 MG/5ML IJ SOLN
INTRAMUSCULAR | Status: AC
  Filled 2018-05-14: qty 5

## 2018-05-14 MED ORDER — VANCOMYCIN HCL IN DEXTROSE 1-5 GM/200ML-% IV SOLN
1000.0000 mg | INTRAVENOUS | Status: AC
  Administered 2018-05-14: 1000 mg via INTRAVENOUS
  Filled 2018-05-14: qty 200

## 2018-05-14 MED ORDER — LIDOCAINE HCL (PF) 1 % IJ SOLN
INTRAMUSCULAR | Status: AC | PRN
  Administered 2018-05-14: 12 mL

## 2018-05-14 MED ORDER — HEPARIN SOD (PORK) LOCK FLUSH 100 UNIT/ML IV SOLN
INTRAVENOUS | Status: AC
  Filled 2018-05-14: qty 5

## 2018-05-14 MED ORDER — FENTANYL CITRATE (PF) 100 MCG/2ML IJ SOLN
INTRAMUSCULAR | Status: AC
  Filled 2018-05-14: qty 4

## 2018-05-14 MED ORDER — MIDAZOLAM HCL 5 MG/5ML IJ SOLN
INTRAMUSCULAR | Status: AC | PRN
  Administered 2018-05-14 (×2): 1 mg via INTRAVENOUS
  Administered 2018-05-14: 2 mg via INTRAVENOUS

## 2018-05-14 MED ORDER — FENTANYL CITRATE (PF) 100 MCG/2ML IJ SOLN
INTRAMUSCULAR | Status: AC | PRN
  Administered 2018-05-14 (×4): 25 ug via INTRAVENOUS

## 2018-05-14 MED ORDER — FENTANYL CITRATE (PF) 100 MCG/2ML IJ SOLN
INTRAMUSCULAR | Status: AC | PRN
  Administered 2018-05-14: 25 ug via INTRAVENOUS
  Administered 2018-05-14: 50 ug via INTRAVENOUS

## 2018-05-14 MED ORDER — SODIUM CHLORIDE 0.9 % IV SOLN
INTRAVENOUS | Status: DC
  Administered 2018-05-14: 1000 mL via INTRAVENOUS

## 2018-05-14 MED ORDER — LIDOCAINE HCL (PF) 1 % IJ SOLN
INTRAMUSCULAR | Status: AC
  Filled 2018-05-14: qty 30

## 2018-05-14 NOTE — H&P (Signed)
 Chief Complaint: Patient was seen in consultation today for bone marrow biopsy and port placement at the request of Finnegan,Timothy J  Referring Physician(s): Finnegan,Timothy J  Patient Status: [facility] - Out-pt  History of Present Illness: Misty Combs is a 32 y.o. female with recent diagnosis of Hodgkin's lymphoma from biopsy of a large anterior mediastinal mass. PET also demonstrates bilateral supraclavicular, left subpectoral and left axillary hypermetabolic lymph nodes.  She has had night sweats and fever at night. Initial chest pain now under good control.  Past Medical History:  Diagnosis Date  . Asthma    exercise induced  . Migraines    no aura  . UTI (lower urinary tract infection)    post coital    Past Surgical History:  Procedure Laterality Date  . ELECTROMAGNETIC NAVIGATION BROCHOSCOPY Left 05/02/2018   Procedure: ELECTROMAGNETIC NAVIGATION BRONCHOSCOPY;  Surgeon: Kasa, Kurian, MD;  Location: [facility] ORS;  Service: Cardiopulmonary;  Laterality: Left;  . ESOPHAGOGASTRODUODENOSCOPY    . KNEE SURGERY Left 2016    Allergies: Penicillins; Other; Sulfur; Tetanus toxoids; and Gluten meal  Medications: Prior to Admission medications   Medication Sig Start Date End Date Taking? Authorizing Provider  amitriptyline (ELAVIL) 25 MG tablet Take 25 mg by mouth at bedtime.  04/02/18  Yes [provider]  azelastine (ASTELIN) 0.1 % nasal spray Place 2 sprays into both nostrils 2 (two) times daily. Use in each nostril as directed    Yes [provider]  Bepotastine Besilate (BEPREVE) 1.5 % SOLN Apply 1 drop to eye 2 (two) times daily.  08/29/17 04/21/19 Yes [provider]  budesonide (PULMICORT) 0.5 MG/2ML nebulizer solution USE 2 MLS (0.5 MG TOTAL) BY NEBULIZATION 2 (TWO) TIMES DAILY. NASAL RINSE 12/23/13  Yes [provider]  dicyclomine (BENTYL) 20 MG tablet Take 20 mg by mouth 4 (four) times daily as needed for spasms.  04/02/18   Yes [provider]  fluticasone furoate-vilanterol (BREO ELLIPTA) 100-25 MCG/INH AEPB Inhale 1 puff into the lungs daily.  08/29/16  Yes [provider]  levocetirizine (XYZAL) 5 MG tablet Take 5 mg by mouth daily.   Yes [provider]  albuterol (PROVENTIL HFA;VENTOLIN HFA) 108 (90 BASE) MCG/ACT inhaler Inhale into the lungs every 6 (six) hours as needed for wheezing or shortness of breath.    [provider]  HYDROcodone-acetaminophen (NORCO/VICODIN) 5-325 MG tablet Take 1 tablet by mouth every 6 (six) hours as needed for moderate pain or severe pain. 04/30/18   Kasa, Kurian, MD  Prenatal Vit-Fe Fumarate-FA (PRENATAL VITAMIN PO) Take 1 tablet by mouth daily.     [provider]     Family History  Problem Relation Age of Onset  . Hypertension Mother   . Heart disease Father     Social History   Socioeconomic History  . Marital status: Married    Spouse name: Not on file  . Number of children: Not on file  . Years of education: Not on file  . Highest education level: Not on file  Occupational History  . Not on file  Social Needs  . Financial resource strain: Not on file  . Food insecurity:    Worry: Not on file    Inability: Not on file  . Transportation needs:    Medical: Not on file    Non-medical: Not on file  Tobacco Use  . Smoking status: Never Smoker  . Smokeless tobacco: Never Used  Substance and Sexual Activity  . Alcohol use:   Yes    Alcohol/week: 2.0 standard drinks    Types: 2 Glasses of wine per week  . Drug use: No  . Sexual activity: Yes    Partners: Male    Birth control/protection: None    Comment: trying for pregnancy; LMP 05/02/18  Lifestyle  . Physical activity:    Days per week: Not on file    Minutes per session: Not on file  . Stress: Not on file  Relationships  . Social connections:    Talks on phone: Not on file    Gets together: Not on file    Attends religious service: Not on file    Active  member of club or organization: Not on file    Attends meetings of clubs or organizations: Not on file    Relationship status: Not on file  Other Topics Concern  . Not on file  Social History Narrative  . Not on file    ECOG Status: 1 - Symptomatic but completely ambulatory  Review of Systems: A 12 point ROS discussed and pertinent positives are indicated in the HPI above.  All other systems are negative.  Review of Systems  Constitutional: Positive for diaphoresis, fatigue and fever.  Respiratory: Negative.   Cardiovascular: Negative.   Gastrointestinal: Negative.   Genitourinary: Negative.   Musculoskeletal: Negative.   Skin: Negative.   Neurological: Negative.     Vital Signs: BP 112/76   Pulse 87   Temp 98.5 F (36.9 C) (Oral)   Resp (!) 24   Ht 5' 5" (1.651 m)   Wt 94.3 kg   LMP 04/27/2018 Comment: neg preg test 12/13  SpO2 99%   BMI 34.61 kg/m   Physical Exam Vitals signs reviewed.  Constitutional:      General: She is not in acute distress.    Appearance: Normal appearance. She is obese. She is not ill-appearing, toxic-appearing or diaphoretic.  HENT:     Head: Normocephalic and atraumatic.     Mouth/Throat:     Mouth: Mucous membranes are moist.     Pharynx: No oropharyngeal exudate or posterior oropharyngeal erythema.  Neck:     Musculoskeletal: Neck supple. No muscular tenderness.  Cardiovascular:     Rate and Rhythm: Normal rate and regular rhythm.     Pulses: Normal pulses.     Heart sounds: Normal heart sounds. No murmur. No friction rub. No gallop.   Pulmonary:     Effort: Pulmonary effort is normal. No respiratory distress.     Breath sounds: Normal breath sounds. No stridor. No wheezing, rhonchi or rales.  Abdominal:     General: Abdomen is flat. There is no distension.     Palpations: Abdomen is soft. There is no mass.     Tenderness: There is no abdominal tenderness. There is no guarding or rebound.     Hernia: No hernia is present.    Musculoskeletal:        General: No swelling.     Right lower leg: No edema.     Left lower leg: No edema.  Lymphadenopathy:     Cervical: No cervical adenopathy.  Skin:    General: Skin is warm and dry.  Neurological:     General: No focal deficit present.     Mental Status: She is alert and oriented to person, place, and time.     Imaging: Dg Chest 1 View  Result Date: 05/04/2018 CLINICAL DATA:  Status post biopsy EXAM: CHEST  1 VIEW COMPARISON:  Chest  CT April 27, 2018 FINDINGS: There is a small left apical pneumothorax without tension component. There is again noted a large mass arising in the left upper lobe medially. Lungs elsewhere clear. Heart size and pulmonary vascularity normal. No adenopathy. No bone lesions. IMPRESSION: Small left apical pneumothorax without tension component. Large mass left upper lobe medially. Lungs elsewhere clear. Stable cardiac silhouette. Critical Value/emergent results were called by telephone at the time of interpretation on 05/04/2018 at 1:17 pm to Dr. DANIEL HASSELL , who verbally acknowledged these results. Electronically Signed   By: William  Woodruff III M.D.   On: 05/04/2018 13:17   Dg Chest 2 View  Result Date: 04/27/2018 CLINICAL DATA:  32-year-old female with pleuritic left chest pain radiating into the shoulder. EXAM: CHEST - 2 VIEW COMPARISON:  Prior chest x-ray 08/10/2011 FINDINGS: Focal curvilinear airspace opacity in the medial aspect of the left upper lobe overlying the AP window on the frontal view. There is associated volume loss with slight elevation of the left hemidiaphragm compared to prior imaging. Otherwise, the cardiac and mediastinal contours are within normal limits. The right lung is clear. The visualized upper abdominal bowel gas pattern is normal. IMPRESSION: Focal curvilinear airspace opacity in the medial aspect of the left upper lobe overlying the AP window on the frontal view with evidence of volume loss in the left  hemithorax. Differential considerations include focal atelectasis, pneumonia, and central obstructing lesion such as endobronchial carcinoid. Recommend further evaluation with CT scan of the chest. Electronically Signed   By: Heath  McCullough M.D.   On: 04/27/2018 15:30   Ct Angio Chest Pe W And/or Wo Contrast  Result Date: 04/27/2018 CLINICAL DATA:  Left-sided chest pain yesterday worsening today with pain radiating to the left shoulder. EXAM: CT ANGIOGRAPHY CHEST WITH CONTRAST TECHNIQUE: Multidetector CT imaging of the chest was performed using the standard protocol during bolus administration of intravenous contrast. Multiplanar CT image reconstructions and MIPs were obtained to evaluate the vascular anatomy. CONTRAST:  100mL ISOVUE-370 IOPAMIDOL (ISOVUE-370) INJECTION 76% COMPARISON:  Same day CXR FINDINGS: Cardiovascular: The study is of quality for the evaluation of pulmonary embolism. There are no filling defects in the central, lobar, segmental or subsegmental pulmonary artery branches to suggest acute pulmonary embolism. Great vessels are normal in course and caliber. Normal heart size. No significant pericardial fluid/thickening. Mediastinum/Nodes: No discrete thyroid nodules or thyromegaly. Unremarkable esophagus. 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 This is causing extrinsic mass effect on the left upper lobe pulmonary artery. Smaller right paratracheal lymph node measuring 11 mm identified. Lungs/Pleura: No pneumothorax. Small left effusion is identified with atelectatic change adjacent to the described left superior mediastinal mass. Upper abdomen: Adrenal mass. The included liver demonstrates no enhancing lesions. No biliary dilatation is identified. The gallbladder is physiologically distended to the extent visualized without stones. Musculoskeletal:  No aggressive appearing focal  osseous lesions. Review of the MIP images confirms the above findings. IMPRESSION: 1. 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 Adjacent atelectasis to the superior mediastinal mass is also noted. Findings are suspicious for possible lymphoma or an abutting left upper lobe primary bronchogenic neoplasm. Referral to pulmonology or cardiothoracic surgery for further disposition is recommended. 2. Extrinsic mass-effect from the mediastinal mass on the   left upper lobe pulmonary artery is noted without occlusion. No acute pulmonary embolus. 3. Nonaneurysmal thoracic aorta without dissection. 4. No thyromegaly or mass. Electronically Signed   By: David  Kwon M.D.   On: 04/27/2018 19:14   Nm Cardiac Muga Rest  Result Date: 05/11/2018 CLINICAL DATA:  Lymphoma cancer. Evaluate cardiac function in relation to chemotherapy. EXAM: NUCLEAR MEDICINE CARDIAC BLOOD POOL IMAGING (MUGA) TECHNIQUE: Cardiac multi-gated acquisition was performed at rest following intravenous injection of Tc-99m labeled red blood cells. RADIOPHARMACEUTICALS:  22.8 mCi Tc-99m pertechnetate in-vitro labeled red blood cells IV COMPARISON:  None. FINDINGS: No  focal wall motion abnormality of the left ventricle. Calculated left ventricular ejection fraction equals 61% IMPRESSION: Left ventricular ejection fraction equals61 %. Electronically Signed   By: Stewart  Edmunds M.D.   On: 05/11/2018 14:05   Nm Pet Image Initial (pi) Skull Base To Thigh  Result Date: 05/10/2018 CLINICAL DATA:  Initial treatment strategy for mediastinal lymphoma. EXAM: NUCLEAR MEDICINE PET SKULL BASE TO THIGH TECHNIQUE: 10.7 mCi F-18 FDG was injected intravenously. Full-ring PET imaging was performed from the skull base to thigh after the radiotracer. CT data was obtained and used for attenuation correction and anatomic  localization. Fasting blood glucose: 981 mg/dl COMPARISON:  Chest CT 04/27/2018 FINDINGS: Mediastinal blood pool activity: SUV max 1.98 NECK: No neck mass or hypermetabolic lymphadenopathy. There is a small subcutaneous nodule noted in the midline of the upper cervical spine posteriorly which measures 10 mm and has an SUV max of 4.09. It could be a subcutaneous lymph node. There is a similar finding more superiorly to the right of midline with SUV max of 3.5. Incidental CT findings: none CHEST: Small bilateral supraclavicular lymph nodes are noted. 6.5 mm left supraclavicular node on image number 63 has an SUV max of 5.05. Left-sided subpectoral nodes are hypermetabolic. The more lateral node measures 8 mm and has an SUV max of 6.65. Largest left axillary lymph node measures 14 mm on image number 76 and has an SUV max of 10.5. Large anterior mediastinal mass is markedly hypermetabolic and has an SUV max of 15.47. Right paratracheal lymph node measures 12 mm on image number 82 and has an SUV max of 9.45. No worrisome pulmonary nodules. There is a small left pleural effusion. There are 2 areas of hypermetabolism involving the right chest wall skin in the region of the right shoulder (CT image 84). No measurable lesions but SUV max is 4.7 posteriorly and 2.6 anteriorly. Incidental CT findings: none ABDOMEN/PELVIS: No abnormal hypermetabolic activity within the liver, pancreas, adrenal glands, or spleen. No hypermetabolic lymph nodes in the abdomen or pelvis. No splenomegaly. No pelvic mass or lymphadenopathy. No inguinal lymphadenopathy. Incidental CT findings: The uterus and ovaries are normal. SKELETON: No focal hypermetabolic activity to suggest skeletal metastasis. Incidental CT findings: none IMPRESSION: 1. Bulky anterior mediastinal mass is markedly hypermetabolic and consistent with known lymphoma. 2. Bilateral supraclavicular, left subpectoral and left axillary lymphadenopathy. 3. No findings for lymphoma below  the diaphragm. 4. A few small vague skin/subcutaneous lesions are hypermetabolic but are of uncertain significance or etiology. Cutaneous/subcutaneous lymphoma is certainly a possibility. Electronically Signed   By: P.  Gallerani M.D.   On: 05/10/2018 12:25   Ct Biopsy  Result Date: 05/04/2018 CLINICAL DATA:  Anterior mediastinal mass. No known primary malignancy. Nondiagnostic bronchoscopic biopsy 05/02/2018. EXAM: CT GUIDED CORE BIOPSY OF  MEDIASTINAL MASS ANESTHESIA/SEDATION: Intravenous Fentanyl and Versed were administered as conscious sedation during continuous monitoring of the patient?s level of consciousness   and physiological / cardiorespiratory status by the radiology RN, with a total moderate sedation time of 14 minutes. PROCEDURE: The procedure risks, benefits, and alternatives were explained to the patient. Questions regarding the procedure were encouraged and answered. The patient understands and consents to the procedure. Select axial scans of the chest performed with overlying marker. An appropriate skin entry site was determined and marked. The operative field was prepped with chlorhexidinein a sterile fashion, and a sterile drape was applied covering the operative field. A sterile gown and sterile gloves were used for the procedure. Local anesthesia was provided with 1% Lidocaine. Under CT fluoroscopic guidance, a 17 gauge trocar needle was advanced to the margin of the lesion. Once needle tip position was confirmed, coaxial 18-gauge core biopsy samples were obtained, submitted in saline to surgical pathology. The guide needle was removed. Postprocedure scans show no hemorrhage, or other apparent complication. Stable small left pneumothorax compared to preprocedure imaging. Stable small left pleural effusion. Patient tolerated procedure well. COMPLICATIONS: None immediate FINDINGS: Anterior mediastinal mass was again localized. Little change from prior CT. Small left pneumothorax was present  on the preprocedure CT. Representative core biopsy samples obtained as above. No apparent complication. IMPRESSION: 1. Technically successful CT-guided core biopsy, anterior mediastinal mass. 2. Small left pneumothorax incidentally noted on the preprocedure scan, stable post biopsy. Electronically Signed   By: D  Hassell M.D.   On: 05/04/2018 13:24   Dg C-arm 1-60 Min-no Report  Result Date: 05/02/2018 Fluoroscopy was utilized by the requesting physician.  No radiographic interpretation.    Labs:  CBC: Recent Labs    04/27/18 1457 05/04/18 1020 05/14/18 0815  WBC 6.5 6.7 9.1  HGB 13.3 12.3 12.0  HCT 40.7 38.2 35.9*  PLT 366 306 278    COAGS: Recent Labs    05/04/18 1020 05/14/18 0815  INR 1.07 1.02    BMP: Recent Labs    04/27/18 1457 05/14/18 0815  NA 140 134*  K 3.8 3.6  CL 109 104  CO2 22 21*  GLUCOSE 153* 100*  BUN 12 13  CALCIUM 9.0 8.8*  CREATININE 0.74 0.78  GFRNONAA >60 >60  GFRAA >60 >60    LIVER FUNCTION TESTS: Recent Labs    04/27/18 1457  BILITOT 0.5  AST 16  ALT 25  ALKPHOS 90  PROT 7.8  ALBUMIN 4.0    Assessment and Plan:  For CT guided bone marrow biopsy followed by port placement today. Risks and benefits discussed with the patient including, but not limited to bleeding, infection, damage to adjacent structures or low yield requiring additional tests. All of the patient's questions were answered, patient is agreeable to proceed. Consent signed and in chart.  Thank you for this interesting consult.  I greatly enjoyed meeting Corinthian Rae Simpson Oliger and look forward to participating in their care.  A copy of this report was sent to the requesting provider on this date.  Electronically Signed:  T , MD 05/14/2018, 9:15 AM   I spent a total of 30 Minutes  in face to face in clinical consultation, greater than 50% of which was counseling/coordinating care for bone marrow biopsy and port placement.  

## 2018-05-14 NOTE — Progress Notes (Signed)
Patient taken directly back to vascular lab for port placement.

## 2018-05-14 NOTE — Procedures (Signed)
Interventional Radiology Procedure Note  Procedure: CT guided bone marrow aspiration and biopsy  Complications: None  EBL: < 10 mL  Findings: Aspirate and core biopsy performed of bone marrow in right iliac bone.  Plan: Bedrest supine x 1 hrs  Anesa Fronek T. Ariday Brinker, M.D Pager:  [phone]   

## 2018-05-14 NOTE — Procedures (Signed)
Interventional Radiology Procedure Note  Procedure: Single Lumen Power Port Placement    Access:  Right IJ vein.  Findings: Catheter tip positioned at SVC/RA junction. Port is ready for immediate use.   Complications: None  EBL: < 10 mL  Recommendations:  - Ok to shower in 24 hours - Do not submerge for 7 days - Routine line care   Diem Dicocco T. Tydarius Yawn, M.D Pager:  [phone]   

## 2018-05-14 NOTE — Discharge Instructions (Signed)
Needle Biopsy of the Bone, Care After Refer to this sheet in the next few weeks. These instructions provide you with information about caring for yourself after your procedure. Your health care provider may also give you more specific instructions. Your treatment has been planned according to current medical practices, but problems sometimes occur. Call your health care provider if you have any problems or questions after your procedure. What can I expect after the procedure? After your procedure, it is common to have soreness or tenderness at the puncture site. Follow these instructions at home:  Take over-the-counter and prescription medicines only as told by your health care provider.  Bathe and shower as told by your health care provider.  Follow instructions from your health care provider about: ? How to take care of your puncture site. ? When and how you should change your bandage (dressing). ? When you should remove your dressing.  Check your puncture site every day for signs of infection. Watch for: ? Redness, swelling, or worsening pain. ? Fluid, blood, or pus.  Return to your normal activities as told by your health care provider.  Keep all follow-up visits as told by your health care provider. This is important. Contact a health care provider if:  You have redness, swelling, or worsening pain at the site of your puncture.  You have fluid, blood, or pus coming from your puncture site.  You have a fever.  You have persistent nausea or vomiting. Get help right away if:  You develop a rash.  You have difficulty breathing. This information is not intended to replace advice given to you by your health care provider. Make sure you discuss any questions you have with your health care provider. Document Released: 11/26/2004 Document Revised: 10/15/2015 Document Reviewed: 06/16/2014 Elsevier Interactive Patient Education  2019 [place].  Moderate Conscious Sedation,  Adult, Care After These instructions provide you with information about caring for yourself after your procedure. Your health care provider may also give you more specific instructions. Your treatment has been planned according to current medical practices, but problems sometimes occur. Call your health care provider if you have any problems or questions after your procedure. What can I expect after the procedure? After your procedure, it is common:  To feel sleepy for several hours.  To feel clumsy and have poor balance for several hours.  To have poor judgment for several hours.  To vomit if you eat too soon. Follow these instructions at home: For at least 24 hours after the procedure:   Do not: ? Participate in activities where you could fall or become injured. ? Drive. ? Use heavy machinery. ? Drink alcohol. ? Take sleeping pills or medicines that cause drowsiness. ? Make important decisions or sign legal documents. ? Take care of children on your own.  Rest. Eating and drinking  Follow the diet recommended by your health care provider.  If you vomit: ? Drink water, juice, or soup when you can drink without vomiting. ? Make sure you have little or no nausea before eating solid foods. General instructions  Have a responsible adult stay with you until you are awake and alert.  Take over-the-counter and prescription medicines only as told by your health care provider.  If you smoke, do not smoke without supervision.  Keep all follow-up visits as told by your health care provider. This is important. Contact a health care provider if:  You keep feeling nauseous or you keep vomiting.  You feel light-headed.  You develop a rash.  You have a fever. Get help right away if:  You have trouble breathing. This information is not intended to replace advice given to you by your health care provider. Make sure you discuss any questions you have with your health care  provider. Document Released: 02/27/2013 Document Revised: 10/12/2015 Document Reviewed: 08/29/2015 Elsevier Interactive Patient Education  2019 [place] Insertion, Care After This sheet gives you information about how to care for yourself after your procedure. Your health care provider may also give you more specific instructions. If you have problems or questions, contact your health care provider. What can I expect after the procedure? After the procedure, it is common to have:  Discomfort at the port insertion site.  Bruising on the skin over the port. This should improve over 3-4 days. Follow these instructions at home: [facility] care  After your port is placed, you will get a manufacturer's information card. The card has information about your port. Keep this card with you at all times.  Take care of the port as told by your health care provider. Ask your health care provider if you or a family member can get training for taking care of the port at home. A home health care nurse may also take care of the port.  Make sure to remember what type of port you have. Incision care      Follow instructions from your health care provider about how to take care of your port insertion site. Make sure you: ? Wash your hands with soap and water before and after you change your bandage (dressing). If soap and water are not available, use hand sanitizer. ? Change your dressing as told by your health care provider. ? Leave stitches (sutures), skin glue, or adhesive strips in place. These skin closures may need to stay in place for 2 weeks or longer. If adhesive strip edges start to loosen and curl up, you may trim the loose edges. Do not remove adhesive strips completely unless your health care provider tells you to do that.  Check your port insertion site every day for signs of infection. Check for: ? Redness, swelling, or pain. ? Fluid or blood. ? Warmth. ? Pus or a bad  smell. Activity  Return to your normal activities as told by your health care provider. Ask your health care provider what activities are safe for you.  Do not lift anything that is heavier than 10 lb (4.5 kg), or the limit that you are told, until your health care provider says that it is safe. General instructions  Take over-the-counter and prescription medicines only as told by your health care provider.  Do not take baths, swim, or use a hot tub until your health care provider approves. Ask your health care provider if you may take showers. You may only be allowed to take sponge baths.  Do not drive for 24 hours if you were given a sedative during your procedure.  Wear a medical alert bracelet in case of an emergency. This will tell any health care providers that you have a port.  Keep all follow-up visits as told by your health care provider. This is important. Contact a health care provider if:  You cannot flush your port with saline as directed, or you cannot draw blood from the port.  You have a fever or chills.  You have redness, swelling, or pain around your port insertion site.  You have fluid or blood  coming from your port insertion site.  Your port insertion site feels warm to the touch.  You have pus or a bad smell coming from the port insertion site. Get help right away if:  You have chest pain or shortness of breath.  You have bleeding from your port that you cannot control. Summary  Take care of the port as told by your health care provider. Keep the manufacturer's information card with you at all times.  Change your dressing as told by your health care provider.  Contact a health care provider if you have a fever or chills or if you have redness, swelling, or pain around your port insertion site.  Keep all follow-up visits as told by your health care provider. This information is not intended to replace advice given to you by your health care provider.  Make sure you discuss any questions you have with your health care provider. Document Released: 02/27/2013 Document Revised: 12/05/2017 Document Reviewed: 12/05/2017 Elsevier Interactive Patient Education  [employer].

## 2018-05-15 ENCOUNTER — Other Ambulatory Visit: Payer: Self-pay

## 2018-05-15 ENCOUNTER — Inpatient Hospital Stay (HOSPITAL_BASED_OUTPATIENT_CLINIC_OR_DEPARTMENT_OTHER): Payer: Commercial Managed Care - PPO | Admitting: Oncology

## 2018-05-15 VITALS — BP 128/82 | HR 90 | Temp 97.0°F | Wt 208.0 lb

## 2018-05-15 DIAGNOSIS — C8192 Hodgkin lymphoma, unspecified, intrathoracic lymph nodes: Secondary | ICD-10-CM | POA: Diagnosis not present

## 2018-05-15 DIAGNOSIS — C8112 Nodular sclerosis classical Hodgkin lymphoma, intrathoracic lymph nodes: Secondary | ICD-10-CM

## 2018-05-15 DIAGNOSIS — Z5111 Encounter for antineoplastic chemotherapy: Secondary | ICD-10-CM | POA: Diagnosis not present

## 2018-05-15 MED ORDER — ONDANSETRON HCL 8 MG PO TABS: 8.0000 mg | ORAL_TABLET | Freq: Two times a day (BID) | ORAL | 2 refills | Status: DC | PRN

## 2018-05-15 MED ORDER — PROCHLORPERAZINE MALEATE 10 MG PO TABS: 10.0000 mg | ORAL_TABLET | Freq: Four times a day (QID) | ORAL | 2 refills | Status: DC | PRN

## 2018-05-15 MED ORDER — LIDOCAINE-PRILOCAINE 2.5-2.5 % EX CREA: TOPICAL_CREAM | CUTANEOUS | 3 refills | Status: DC

## 2018-05-15 NOTE — Progress Notes (Signed)
Patient is here to follow up on her lymphoma. Patient had her bone biopsy done and port-a-cath placed. Patient stated that she has some discomfort on her right side of her chest due to the port-a-cath but doing well.

## 2018-05-17 ENCOUNTER — Inpatient Hospital Stay: Payer: Commercial Managed Care - PPO

## 2018-05-17 ENCOUNTER — Other Ambulatory Visit: Payer: Commercial Managed Care - PPO

## 2018-05-17 NOTE — Patient Instructions (Signed)
Doxorubicin injection What is this medicine? DOXORUBICIN (dox oh ROO bi sin) is a chemotherapy drug. It is used to treat many kinds of cancer like leukemia, lymphoma, neuroblastoma, sarcoma, and Wilms' tumor. It is also used to treat bladder cancer, breast cancer, lung cancer, ovarian cancer, stomach cancer, and thyroid cancer. This medicine may be used for other purposes; ask your health care provider or pharmacist if you have questions. COMMON BRAND NAME(S): Adriamycin, Adriamycin PFS, Adriamycin RDF, Rubex What should I tell my health care provider before I take this medicine? They need to know if you have any of these conditions: -heart disease -history of low blood counts caused by a medicine -liver disease -recent or ongoing radiation therapy -an unusual or allergic reaction to doxorubicin, other chemotherapy agents, other medicines, foods, dyes, or preservatives -pregnant or trying to get pregnant -breast-feeding How should I use this medicine? This drug is given as an infusion into a vein. It is administered in a hospital or clinic by a specially trained health care professional. If you have pain, swelling, burning or any unusual feeling around the site of your injection, tell your health care professional right away. Talk to your pediatrician regarding the use of this medicine in children. Special care may be needed. Overdosage: If you think you have taken too much of this medicine contact a poison control center or emergency room at once. NOTE: This medicine is only for you. Do not share this medicine with others. What if I miss a dose? It is important not to miss your dose. Call your doctor or health care professional if you are unable to keep an appointment. What may interact with this medicine? This medicine may interact with the following medications: -6-mercaptopurine -paclitaxel -phenytoin -St. John's Wort -trastuzumab -verapamil This list may not describe all possible  interactions. Give your health care provider a list of all the medicines, herbs, non-prescription drugs, or dietary supplements you use. Also tell them if you smoke, drink alcohol, or use illegal drugs. Some items may interact with your medicine. What should I watch for while using this medicine? This drug may make you feel generally unwell. This is not uncommon, as chemotherapy can affect healthy cells as well as cancer cells. Report any side effects. Continue your course of treatment even though you feel ill unless your doctor tells you to stop. There is a maximum amount of this medicine you should receive throughout your life. The amount depends on the medical condition being treated and your overall health. Your doctor will watch how much of this medicine you receive in your lifetime. Tell your doctor if you have taken this medicine before. You may need blood work done while you are taking this medicine. Your urine may turn red for a few days after your dose. This is not blood. If your urine is dark or brown, call your doctor. In some cases, you may be given additional medicines to help with side effects. Follow all directions for their use. Call your doctor or health care professional for advice if you get a fever, chills or sore throat, or other symptoms of a cold or flu. Do not treat yourself. This drug decreases your body's ability to fight infections. Try to avoid being around people who are sick. This medicine may increase your risk to bruise or bleed. Call your doctor or health care professional if you notice any unusual bleeding. Talk to your doctor about your risk of cancer. You may be more at risk for certain   increase your risk to bruise or bleed. Call your doctor or health care professional if you notice any unusual bleeding.  Talk to your doctor about your risk of cancer. You may be more at risk for certain types of cancers if you take this medicine.  Do not become pregnant while taking this medicine or for 6 months after stopping it. Women should inform their doctor if they wish to become pregnant or think they might be pregnant. Men should not father a child while taking this medicine and  for 6 months after stopping it. There is a potential for serious side effects to an unborn child. Talk to your health care professional or pharmacist for more information. Do not breast-feed an infant while taking this medicine.  This medicine has caused ovarian failure in some women and reduced sperm counts in some men This medicine may interfere with the ability to have a child. Talk with your doctor or health care professional if you are concerned about your fertility.  This medicine may cause a decrease in Co-Enzyme Q-10. You should make sure that you get enough Co-Enzyme Q-10 while you are taking this medicine. Discuss the foods you eat and the vitamins you take with your health care professional.  What side effects may I notice from receiving this medicine?  Side effects that you should report to your doctor or health care professional as soon as possible:  -allergic reactions like skin rash, itching or hives, swelling of the face, lips, or tongue  -breathing problems  -chest pain  -fast or irregular heartbeat  -low blood counts - this medicine may decrease the number of white blood cells, red blood cells and platelets. You may be at increased risk for infections and bleeding.  -pain, redness, or irritation at site where injected  -signs of infection - fever or chills, cough, sore throat, pain or difficulty passing urine  -signs of decreased platelets or bleeding - bruising, pinpoint red spots on the skin, black, tarry stools, blood in the urine  -swelling of the ankles, feet, hands  -tiredness  -weakness  Side effects that usually do not require medical attention (report to your doctor or health care professional if they continue or are bothersome):  -diarrhea  -hair loss  -mouth sores  -nail discoloration or damage  -nausea  -red colored urine  -vomiting  This list may not describe all possible side effects. Call your doctor for medical advice about side effects. You may report side effects to FDA at  1-800-FDA-1088.  Where should I keep my medicine?  This drug is given in a hospital or clinic and will not be stored at home.  NOTE: This sheet is a summary. It may not cover all possible information. If you have questions about this medicine, talk to your doctor, pharmacist, or health care provider.   2019 Elsevier/Gold Standard (2016-12-21 11:01:26)  Bleomycin injection  What is this medicine?  BLEOMYCIN (blee oh MYE sin) is a chemotherapy drug. It is used to treat many kinds of cancer like lymphoma, cervical cancer, head and neck cancer, and testicular cancer. It is also used to prevent and to treat fluid build-up around the lungs caused by some cancers.  This medicine may be used for other purposes; ask your health care provider or pharmacist if you have questions.  COMMON BRAND NAME(S): Blenoxane  What should I tell my health care provider before I take this medicine?  They need to know if you have any of these conditions:  -  cigarette smoker  -kidney disease  -lung disease  -recent or ongoing radiation therapy  -an unusual or allergic reaction to bleomycin, other chemotherapy agents, other medicines, foods, dyes, or preservatives  -pregnant or trying to get pregnant  -breast-feeding  How should I use this medicine?  This drug is given as an infusion into a vein or a body cavity. It can also be given as an injection into a muscle or under the skin. It is administered in a hospital or clinic by a specially trained health care professional.  Talk to your pediatrician regarding the use of this medicine in children. Special care may be needed.  Overdosage: If you think you have taken too much of this medicine contact a poison control center or emergency room at once.  NOTE: This medicine is only for you. Do not share this medicine with others.  What if I miss a dose?  It is important not to miss your dose. Call your doctor or health care professional if you are unable to keep an appointment.  What may interact with  this medicine?  -certain antibiotics given by injection  -cisplatin  -cyclosporine  -diuretics  -foscarnet  -medicines to increase blood counts like filgrastim, pegfilgrastim, sargramostim  -vaccines  This list may not describe all possible interactions. Give your health care provider a list of all the medicines, herbs, non-prescription drugs, or dietary supplements you use. Also tell them if you smoke, drink alcohol, or use illegal drugs. Some items may interact with your medicine.  What should I watch for while using this medicine?  Visit your doctor for checks on your progress. This drug may make you feel generally unwell. This is not uncommon, as chemotherapy can affect healthy cells as well as cancer cells. Report any side effects. Continue your course of treatment even though you feel ill unless your doctor tells you to stop.  Call your doctor or health care professional for advice if you get a fever, chills or sore throat, or other symptoms of a cold or flu. Do not treat yourself. This drug decreases your body's ability to fight infections. Try to avoid being around people who are sick.  Avoid taking products that contain aspirin, acetaminophen, ibuprofen, naproxen, or ketoprofen unless instructed by your doctor. These medicines may hide a fever.  Do not become pregnant while taking this medicine. Women should inform their doctor if they wish to become pregnant or think they might be pregnant. There is a potential for serious side effects to an unborn child. Talk to your health care professional or pharmacist for more information. Do not breast-feed an infant while taking this medicine.  There is a maximum amount of this medicine you should receive throughout your life. The amount depends on the medical condition being treated and your overall health. Your doctor will watch how much of this medicine you receive in your lifetime. Tell your doctor if you have taken this medicine before.  What side effects may I  notice from receiving this medicine?  Side effects that you should report to your doctor or health care professional as soon as possible:  -allergic reactions like skin rash, itching or hives, swelling of the face, lips, or tongue  -breathing problems  -chest pain  -confusion  -cough  -fast, irregular heartbeat  -feeling faint or lightheaded, falls  -fever or chills  -mouth sores  -pain, tingling, numbness in the hands or feet  -trouble passing urine or change in the amount of urine  -yellowing   of the eyes or skin  Side effects that usually do not require medical attention (report to your doctor or health care professional if they continue or are bothersome):  -darker skin color  -hair loss  -irritation at site where injected  -loss of appetite  -nail changes  -nausea and vomiting  -weight loss  This list may not describe all possible side effects. Call your doctor for medical advice about side effects. You may report side effects to FDA at 1-800-FDA-1088.  Where should I keep my medicine?  This drug is given in a hospital or clinic and will not be stored at home.  NOTE: This sheet is a summary. It may not cover all possible information. If you have questions about this medicine, talk to your doctor, pharmacist, or health care provider.   2019 Elsevier/Gold Standard (2012-09-04 09:36:48)  Vinblastine injection  What is this medicine?  VINBLASTINE (vin BLAS teen) is a chemotherapy drug. It slows the growth of cancer cells. This medicine is used to treat many types of cancer like breast cancer, testicular cancer, Hodgkin's disease, non-Hodgkin's lymphoma, and sarcoma.  This medicine may be used for other purposes; ask your health care provider or pharmacist if you have questions.  COMMON BRAND NAME(S): Velban  What should I tell my health care provider before I take this medicine?  They need to know if you have any of these conditions:  -blood disorders  -dental disease  -gout  -infection (especially a virus  infection such as chickenpox, cold sores, or herpes)  -liver disease  -lung disease  -nervous system disease  -recent or ongoing radiation therapy  -an unusual or allergic reaction to vinblastine, other chemotherapy agents, other medicines, foods, dyes, or preservatives  -pregnant or trying to get pregnant  -breast-feeding  How should I use this medicine?  This drug is given as an infusion into a vein. It is administered in a hospital or clinic by a specially trained health care professional. If you have pain, swelling, burning or any unusual feeling around the site of your injection, tell your health care professional right away.  Talk to your pediatrician regarding the use of this medicine in children. While this drug may be prescribed for selected conditions, precautions do apply.  Overdosage: If you think you have taken too much of this medicine contact a poison control center or emergency room at once.  NOTE: This medicine is only for you. Do not share this medicine with others.  What if I miss a dose?  It is important not to miss your dose. Call your doctor or health care professional if you are unable to keep an appointment.  What may interact with this medicine?  Do not take this medicine with any of the following medications:  -erythromycin  -itraconazole  -mibefradil  -voriconazole  This medicine may also interact with the following medications:  -cyclosporine  -fluconazole  -ketoconazole  -medicines for seizures like phenytoin  -medicines to increase blood counts like filgrastim, pegfilgrastim, sargramostim  -vaccines  -verapamil  Talk to your doctor or health care professional before taking any of these medicines:  -acetaminophen  -aspirin  -ibuprofen  -ketoprofen  -naproxen  This list may not describe all possible interactions. Give your health care provider a list of all the medicines, herbs, non-prescription drugs, or dietary supplements you use. Also tell them if you smoke, drink alcohol, or use  illegal drugs. Some items may interact with your medicine.  What should I watch for while using this   medicine?  Your condition will be monitored carefully while you are receiving this medicine. You will need important blood work done while you are taking this medicine.  This drug may make you feel generally unwell. This is not uncommon, as chemotherapy can affect healthy cells as well as cancer cells. Report any side effects. Continue your course of treatment even though you feel ill unless your doctor tells you to stop.  In some cases, you may be given additional medicines to help with side effects. Follow all directions for their use.  Call your doctor or health care professional for advice if you get a fever, chills or sore throat, or other symptoms of a cold or flu. Do not treat yourself. This drug decreases your body's ability to fight infections. Try to avoid being around people who are sick.  This medicine may increase your risk to bruise or bleed. Call your doctor or health care professional if you notice any unusual bleeding.  Be careful brushing and flossing your teeth or using a toothpick because you may get an infection or bleed more easily. If you have any dental work done, tell your dentist you are receiving this medicine.  Avoid taking products that contain aspirin, acetaminophen, ibuprofen, naproxen, or ketoprofen unless instructed by your doctor. These medicines may hide a fever.  Do not become pregnant while taking this medicine. Women should inform their doctor if they wish to become pregnant or think they might be pregnant. There is a potential for serious side effects to an unborn child. Talk to your health care professional or pharmacist for more information. Do not breast-feed an infant while taking this medicine.  Men may have a lower sperm count while taking this medicine. Talk to your doctor if you plan to father a child.  What side effects may I notice from receiving this medicine?  Side  effects that you should report to your doctor or health care professional as soon as possible:  -allergic reactions like skin rash, itching or hives, swelling of the face, lips, or tongue  -low blood counts - This drug may decrease the number of white blood cells, red blood cells and platelets. You may be at increased risk for infections and bleeding.  -signs of infection - fever or chills, cough, sore throat, pain or difficulty passing urine  -signs of decreased platelets or bleeding - bruising, pinpoint red spots on the skin, black, tarry stools, nosebleeds  -signs of decreased red blood cells - unusually weak or tired, fainting spells, lightheadedness  -breathing problems  -changes in hearing  -change in the amount of urine  -chest pain  -high blood pressure  -mouth sores  -nausea and vomiting  -pain, swelling, redness or irritation at the injection site  -pain, tingling, numbness in the hands or feet  -problems with balance, dizziness  -seizures  Side effects that usually do not require medical attention (report to your doctor or health care professional if they continue or are bothersome):  -constipation  -hair loss  -jaw pain  -loss of appetite  -sensitivity to light  -stomach pain  -tumor pain  This list may not describe all possible side effects. Call your doctor for medical advice about side effects. You may report side effects to FDA at 1-800-FDA-1088.  Where should I keep my medicine?  This drug is given in a hospital or clinic and will not be stored at home.  NOTE: This sheet is a summary. It may not cover all possible information. If   you have questions about this medicine, talk to your doctor, pharmacist, or health care provider.   2019 Elsevier/Gold Standard (2008-02-04 17:15:59)  Dacarbazine, DTIC injection  What is this medicine?  DACARBAZINE (da KAR ba zeen) is a chemotherapy drug. This medicine is used to treat skin cancer. It is also used with other medicines to treat Hodgkin's disease.  This  medicine may be used for other purposes; ask your health care provider or pharmacist if you have questions.  COMMON BRAND NAME(S): DTIC-Dome  What should I tell my health care provider before I take this medicine?  They need to know if you have any of these conditions:  -infection (especially virus infection such as chickenpox, cold sores, or herpes)  -kidney disease  -liver disease  -low blood counts like low platelets, red blood cells, white blood cells  -recent radiation therapy  -an unusual or allergic reaction to dacarbazine, other chemotherapy agents, other medicines, foods, dyes, or preservatives  -pregnant or trying to get pregnant  -breast-feeding  How should I use this medicine?  This drug is given as an injection or infusion into a vein. It is administered in a hospital or clinic by a specially trained health care professional.  Talk to your pediatrician regarding the use of this medicine in children. While this drug may be prescribed for selected conditions, precautions do apply.  Overdosage: If you think you have taken too much of this medicine contact a poison control center or emergency room at once.  NOTE: This medicine is only for you. Do not share this medicine with others.  What if I miss a dose?  It is important not to miss your dose. Call your doctor or health care professional if you are unable to keep an appointment.  What may interact with this medicine?  -medicines to increase blood counts like filgrastim, pegfilgrastim, sargramostim  -vaccines  This list may not describe all possible interactions. Give your health care provider a list of all the medicines, herbs, non-prescription drugs, or dietary supplements you use. Also tell them if you smoke, drink alcohol, or use illegal drugs. Some items may interact with your medicine.  What should I watch for while using this medicine?  Your condition will be monitored carefully while you are receiving this medicine. You will need important blood  work done while you are taking this medicine.  This drug may make you feel generally unwell. This is not uncommon, as chemotherapy can affect healthy cells as well as cancer cells. Report any side effects. Continue your course of treatment even though you feel ill unless your doctor tells you to stop.  Call your doctor or health care professional for advice if you get a fever, chills or sore throat, or other symptoms of a cold or flu. Do not treat yourself. This drug decreases your body's ability to fight infections. Try to avoid being around people who are sick.  This medicine may increase your risk to bruise or bleed. Call your doctor or health care professional if you notice any unusual bleeding.  Talk to your doctor about your risk of cancer. You may be more at risk for certain types of cancers if you take this medicine.  Do not become pregnant while taking this medicine. Women should inform their doctor if they wish to become pregnant or think they might be pregnant. There is a potential for serious side effects to an unborn child. Talk to your health care professional or pharmacist for more information. Do not   breast-feed an infant while taking this medicine.  What side effects may I notice from receiving this medicine?  Side effects that you should report to your doctor or health care professional as soon as possible:  -allergic reactions like skin rash, itching or hives, swelling of the face, lips, or tongue  -low blood counts - this medicine may decrease the number of white blood cells, red blood cells and platelets. You may be at increased risk for infections and bleeding.  -signs of infection - fever or chills, cough, sore throat, pain or difficulty passing urine  -signs of decreased platelets or bleeding - bruising, pinpoint red spots on the skin, black, tarry stools, blood in the urine  -signs of decreased red blood cells - unusually weak or tired, fainting spells, lightheadedness  -breathing  problems  -muscle pains  -pain at site where injected  -trouble passing urine or change in the amount of urine  -vomiting  -yellowing of the eyes or skin  Side effects that usually do not require medical attention (report to your doctor or health care professional if they continue or are bothersome):  -diarrhea  -hair loss  -loss of appetite  -nausea  -skin more sensitive to sun or ultraviolet light  -stomach upset  This list may not describe all possible side effects. Call your doctor for medical advice about side effects. You may report side effects to FDA at 1-800-FDA-1088.  Where should I keep my medicine?  This drug is given in a hospital or clinic and will not be stored at home.  NOTE: This sheet is a summary. It may not cover all possible information. If you have questions about this medicine, talk to your doctor, pharmacist, or health care provider.   2019 Elsevier/Gold Standard (2015-07-10 15:17:39)

## 2018-05-18 ENCOUNTER — Other Ambulatory Visit: Payer: Commercial Managed Care - PPO

## 2018-05-18 NOTE — Progress Notes (Signed)
[facility]  [phone]) [phone] Fax:(336) [phone]  ID: Misty Combs OB: [birth year]  MR#: [id number]  TZG#:[id number]  Patient Care Team: Gala Lewandowsky, MD as PCP - General (Family Medicine)  CHIEF COMPLAINT: Stage II Hodgkin's lymphoma.  INTERVAL HISTORY: Patient returns to clinic today for further evaluation and consideration of cycle 1, day 1 of ABVD.  She continues to have a pruritic rash that only is mildly helped with Benadryl, but otherwise feels well.  She has no neurologic complaints.  She denies any fevers, chills, night sweats, or weight loss.  She denies any further chest pain or shortness of breath.  She has no cough or hemoptysis.  She denies any nausea, vomiting, constipation, or diarrhea.  She has no urinary complaints.  Patient offers no further specific complaints today.  REVIEW OF SYSTEMS:   Review of Systems  Constitutional: Negative.  Negative for fever, malaise/fatigue and weight loss.  Respiratory: Negative for cough and shortness of breath.   Cardiovascular: Negative for chest pain and leg swelling.  Gastrointestinal: Negative.  Negative for abdominal pain, blood in stool and melena.  Genitourinary: Negative.  Negative for dysuria and flank pain.  Musculoskeletal: Negative.  Negative for back pain.  Skin: Positive for itching and rash.  Neurological: Negative.  Negative for focal weakness, weakness and headaches.  Psychiatric/Behavioral: The patient is nervous/anxious.     As per HPI. Otherwise, a complete review of systems is negative.  PAST MEDICAL HISTORY: Past Medical History:  Diagnosis Date  . Asthma    exercise induced  . Migraines    no aura  . UTI (lower urinary tract infection)    post coital    PAST SURGICAL HISTORY: Past Surgical History:  Procedure Laterality Date  . ELECTROMAGNETIC NAVIGATION BROCHOSCOPY Left 05/02/2018   Procedure: ELECTROMAGNETIC NAVIGATION BRONCHOSCOPY;  Surgeon: Flora Lipps,  MD;  Location: [facility] ORS;  Service: Cardiopulmonary;  Laterality: Left;  . ESOPHAGOGASTRODUODENOSCOPY    . IR IMAGING GUIDED PORT INSERTION  05/14/2018  . KNEE SURGERY Left 2016    FAMILY HISTORY: Family History  Problem Relation Age of Onset  . Hypertension Mother   . Heart disease Father     ADVANCED DIRECTIVES (Y/N):  N  HEALTH MAINTENANCE: Social History   Tobacco Use  . Smoking status: Never Smoker  . Smokeless tobacco: Never Used  Substance Use Topics  . Alcohol use: Yes    Alcohol/week: 2.0 standard drinks    Types: 2 Glasses of wine per week  . Drug use: No     Colonoscopy:  PAP:  Bone density:  Lipid panel:  Allergies  Allergen Reactions  . Penicillins Anaphylaxis  . Other     allergic to cats  . Sulfur Nausea And Vomiting    Dizzy  . Tetanus Toxoids Swelling    Fever and swelling  . Gluten Meal Rash    Sensitive to gluten    Current Outpatient Medications  Medication Sig Dispense Refill  . albuterol (PROVENTIL HFA;VENTOLIN HFA) 108 (90 BASE) MCG/ACT inhaler Inhale into the lungs every 6 (six) hours as needed for wheezing or shortness of breath.    Marland Kitchen amitriptyline (ELAVIL) 25 MG tablet Take 25 mg by mouth at bedtime.   1  . azelastine (ASTELIN) 0.1 % nasal spray Place 2 sprays into both nostrils 2 (two) times daily. Use in each nostril as directed     . Bepotastine Besilate (BEPREVE) 1.5 % SOLN Apply 1 drop to eye 2 (two) times daily.     Marland Kitchen  budesonide (PULMICORT) 0.5 MG/2ML nebulizer solution USE 2 MLS (0.5 MG TOTAL) BY NEBULIZATION 2 (TWO) TIMES DAILY. NASAL RINSE    . dicyclomine (BENTYL) 20 MG tablet Take 20 mg by mouth 4 (four) times daily as needed for spasms.   1  . fluticasone furoate-vilanterol (BREO ELLIPTA) 100-25 MCG/INH AEPB Inhale 1 puff into the lungs daily.     . hydrOXYzine (VISTARIL) 25 MG capsule Take 1 capsule by mouth.    . levocetirizine (XYZAL) 5 MG tablet Take 5 mg by mouth daily.    Marland Kitchen lidocaine-prilocaine (EMLA) cream Apply to  affected area once 30 g 3  . HYDROcodone-acetaminophen (NORCO/VICODIN) 5-325 MG tablet Take 1 tablet by mouth every 6 (six) hours as needed for moderate pain or severe pain. (Patient not taking: Reported on 05/21/2018) 20 tablet 0  . ondansetron (ZOFRAN) 8 MG tablet Take 1 tablet (8 mg total) by mouth 2 (two) times daily as needed. (Patient not taking: Reported on 05/21/2018) 30 tablet 2  . predniSONE (STERAPRED UNI-PAK 21 TAB) 10 MG (21) TBPK tablet Taper as directed 21 tablet 0  . Prenatal Vit-Fe Fumarate-FA (PRENATAL VITAMIN PO) Take 1 tablet by mouth daily.     . prochlorperazine (COMPAZINE) 10 MG tablet Take 1 tablet (10 mg total) by mouth every 6 (six) hours as needed (Nausea or vomiting). (Patient not taking: Reported on 05/21/2018) 60 tablet 2   No current facility-administered medications for this visit.     OBJECTIVE: Vitals:   05/21/18 0840 05/21/18 0846  BP: 105/72   Pulse: 90   Resp: 16   Temp: (!) 97.3 F (36.3 C)   SpO2:  97%     Body mass index is 34.61 kg/m.    ECOG FS:0 - Asymptomatic  General: Well-developed, well-nourished, no acute distress. Eyes: Pink conjunctiva, anicteric sclera. HEENT: Normocephalic, moist mucous membranes, clear oropharnyx. Lungs: Clear to auscultation bilaterally. Heart: Regular rate and rhythm. No rubs, murmurs, or gallops. Abdomen: Soft, nontender, nondistended. No organomegaly noted, normoactive bowel sounds. Musculoskeletal: No edema, cyanosis, or clubbing. Neuro: Alert, answering all questions appropriately. Cranial nerves grossly intact. Skin: Mild rash noted on neck. Psych: Normal affect. Lymphatics: Supraclavicular and axillary lymphadenopathy.  LAB RESULTS:  Lab Results  Component Value Date   NA 138 05/21/2018   K 4.3 05/21/2018   CL 107 05/21/2018   CO2 24 05/21/2018   GLUCOSE 100 (H) 05/21/2018   BUN 15 05/21/2018   CREATININE 0.71 05/21/2018   CALCIUM 8.9 05/21/2018   PROT 7.8 05/21/2018   ALBUMIN 3.7 05/21/2018     AST 17 05/21/2018   ALT 25 05/21/2018   ALKPHOS 94 05/21/2018   BILITOT 0.3 05/21/2018   GFRNONAA >60 05/21/2018   GFRAA >60 05/21/2018    Lab Results  Component Value Date   WBC 7.3 05/21/2018   NEUTROABS 5.7 05/21/2018   HGB 11.7 (L) 05/21/2018   HCT 36.2 05/21/2018   MCV 86.2 05/21/2018   PLT 269 05/21/2018     STUDIES: Dg Chest 1 View  Result Date: 05/04/2018 CLINICAL DATA:  Status post biopsy EXAM: CHEST  1 VIEW COMPARISON:  Chest CT April 27, 2018 FINDINGS: There is a small left apical pneumothorax without tension component. There is again noted a large mass arising in the left upper lobe medially. Lungs elsewhere clear. Heart size and pulmonary vascularity normal. No adenopathy. No bone lesions. IMPRESSION: Small left apical pneumothorax without tension component. Large mass left upper lobe medially. Lungs elsewhere clear. Stable cardiac silhouette. Critical Value/emergent results  were called by telephone at the time of interpretation on 05/04/2018 at 1:17 pm to Dr. Arne Cleveland , who verbally acknowledged these results. Electronically Signed   By: Lowella Grip III M.D.   On: 05/04/2018 13:17   Dg Chest 2 View  Result Date: 04/27/2018 CLINICAL DATA:  32 year old female with pleuritic left chest pain radiating into the shoulder. EXAM: CHEST - 2 VIEW COMPARISON:  Prior chest x-ray 08/10/2011 FINDINGS: Focal curvilinear airspace opacity in the medial aspect of the left upper lobe overlying the AP window on the frontal view. There is associated volume loss with slight elevation of the left hemidiaphragm compared to prior imaging. Otherwise, the cardiac and mediastinal contours are within normal limits. The right lung is clear. The visualized upper abdominal bowel gas pattern is normal. IMPRESSION: Focal curvilinear airspace opacity in the medial aspect of the left upper lobe overlying the AP window on the frontal view with evidence of volume loss in the left hemithorax.  Differential considerations include focal atelectasis, pneumonia, and central obstructing lesion such as endobronchial carcinoid. Recommend further evaluation with CT scan of the chest. Electronically Signed   By: Jacqulynn Cadet M.D.   On: 04/27/2018 15:30   Ct Angio Chest Pe W And/or Wo Contrast  Result Date: 04/27/2018 CLINICAL DATA:  Left-sided chest pain yesterday worsening today with pain radiating to the left shoulder. EXAM: CT ANGIOGRAPHY CHEST WITH CONTRAST TECHNIQUE: Multidetector CT imaging of the chest was performed using the standard protocol during bolus administration of intravenous contrast. Multiplanar CT image reconstructions and MIPs were obtained to evaluate the vascular anatomy. CONTRAST:  143m ISOVUE-370 IOPAMIDOL (ISOVUE-370) INJECTION 76% COMPARISON:  Same day CXR FINDINGS: Cardiovascular: The study is of quality for the evaluation of pulmonary embolism. There are no filling defects in the central, lobar, segmental or subsegmental pulmonary artery branches to suggest acute pulmonary embolism. Great vessels are normal in course and caliber. Normal heart size. No significant pericardial fluid/thickening. Mediastinum/Nodes: No discrete thyroid nodules or thyromegaly. Unremarkable esophagus. 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 This is causing extrinsic mass effect on the left upper lobe pulmonary artery. Smaller right paratracheal lymph node measuring 11 mm identified. Lungs/Pleura: No pneumothorax. Small left effusion is identified with atelectatic change adjacent to the described left superior mediastinal mass. Upper abdomen: Adrenal mass. The included liver demonstrates no enhancing lesions. No biliary dilatation is identified. The gallbladder is physiologically distended to the extent visualized without stones. Musculoskeletal:  No aggressive appearing focal osseous lesions.  Review of the MIP images confirms the above findings. IMPRESSION: 1. 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 Adjacent atelectasis to the superior mediastinal mass is also noted. Findings are suspicious for possible lymphoma or an abutting left upper lobe primary bronchogenic neoplasm. Referral to pulmonology or cardiothoracic surgery for further disposition is recommended. 2. Extrinsic mass-effect from the mediastinal mass on the left upper lobe pulmonary artery is noted without occlusion. No acute pulmonary embolus. 3. Nonaneurysmal thoracic aorta without dissection. 4. No thyromegaly or mass. Electronically Signed   By: DAshley RoyaltyM.D.   On: 04/27/2018 19:14   Nm Cardiac Muga Rest  Result Date: 05/11/2018 CLINICAL DATA:  Lymphoma cancer. Evaluate cardiac function in relation to chemotherapy. EXAM: NUCLEAR MEDICINE CARDIAC BLOOD POOL  IMAGING (MUGA) TECHNIQUE: Cardiac multi-gated acquisition was performed at rest following intravenous injection of Tc-22mlabeled red blood cells. RADIOPHARMACEUTICALS:  22.8 mCi Tc-9100mertechnetate in-vitro labeled red blood cells IV COMPARISON:  None. FINDINGS: No  focal wall motion abnormality of the left ventricle. Calculated left ventricular ejection fraction equals 61% IMPRESSION: Left ventricular ejection fraction equals61 %. Electronically Signed   By: StSuzy Bouchard.D.   On: 05/11/2018 14:05   Nm Pet Image Initial (pi) Skull Base To Thigh  Result Date: 05/10/2018 CLINICAL DATA:  Initial treatment strategy for mediastinal lymphoma. EXAM: NUCLEAR MEDICINE PET SKULL BASE TO THIGH TECHNIQUE: 10.7 mCi F-18 FDG was injected intravenously. Full-ring PET imaging was performed from the skull base to thigh after the radiotracer. CT data was obtained and used for attenuation correction and anatomic localization. Fasting  blood glucose: 981 mg/dl COMPARISON:  Chest CT 04/27/2018 FINDINGS: Mediastinal blood pool activity: SUV max 1.98 NECK: No neck mass or hypermetabolic lymphadenopathy. There is a small subcutaneous nodule noted in the midline of the upper cervical spine posteriorly which measures 10 mm and has an SUV max of 4.09. It could be a subcutaneous lymph node. There is a similar finding more superiorly to the right of midline with SUV max of 3.5. Incidental CT findings: none CHEST: Small bilateral supraclavicular lymph nodes are noted. 6.5 mm left supraclavicular node on image number 63 has an SUV max of 5.05. Left-sided subpectoral nodes are hypermetabolic. The more lateral node measures 8 mm and has an SUV max of 6.65. Largest left axillary lymph node measures 14 mm on image number 76 and has an SUV max of 10.5. Large anterior mediastinal mass is markedly hypermetabolic and has an SUV max of 15.47. Right paratracheal lymph node measures 12 mm on image number 82 and has an SUV max of 9.45. No worrisome pulmonary nodules. There is a small left pleural effusion. There are 2 areas of hypermetabolism involving the right chest wall skin in the region of the right shoulder (CT image 84). No measurable lesions but SUV max is 4.7 posteriorly and 2.6 anteriorly. Incidental CT findings: none ABDOMEN/PELVIS: No abnormal hypermetabolic activity within the liver, pancreas, adrenal glands, or spleen. No hypermetabolic lymph nodes in the abdomen or pelvis. No splenomegaly. No pelvic mass or lymphadenopathy. No inguinal lymphadenopathy. Incidental CT findings: The uterus and ovaries are normal. SKELETON: No focal hypermetabolic activity to suggest skeletal metastasis. Incidental CT findings: none IMPRESSION: 1. Bulky anterior mediastinal mass is markedly hypermetabolic and consistent with known lymphoma. 2. Bilateral supraclavicular, left subpectoral and left axillary lymphadenopathy. 3. No findings for lymphoma below the diaphragm. 4. A  few small vague skin/subcutaneous lesions are hypermetabolic but are of uncertain significance or etiology. Cutaneous/subcutaneous lymphoma is certainly a possibility. Electronically Signed   By: P.Marijo Sanes.D.   On: 05/10/2018 12:25   Ct Biopsy  Result Date: 05/04/2018 CLINICAL DATA:  Anterior mediastinal mass. No known primary malignancy. Nondiagnostic bronchoscopic biopsy 05/02/2018. EXAM: CT GUIDED CORE BIOPSY OF  MEDIASTINAL MASS ANESTHESIA/SEDATION: Intravenous Fentanyl and Versed were administered as conscious sedation during continuous monitoring of the patient?s level of consciousness and physiological / cardiorespiratory status by the radiology RN, with a total moderate sedation time of 14 minutes. PROCEDURE: The procedure risks, benefits, and alternatives were explained to the patient. Questions regarding the procedure were encouraged and answered. The patient understands and consents to the procedure. Select axial scans of the chest performed with overlying marker. An appropriate skin entry site was determined and marked. The  operative field was prepped with chlorhexidinein a sterile fashion, and a sterile drape was applied covering the operative field. A sterile gown and sterile gloves were used for the procedure. Local anesthesia was provided with 1% Lidocaine. Under CT fluoroscopic guidance, a 17 gauge trocar needle was advanced to the margin of the lesion. Once needle tip position was confirmed, coaxial 18-gauge core biopsy samples were obtained, submitted in saline to surgical pathology. The guide needle was removed. Postprocedure scans show no hemorrhage, or other apparent complication. Stable small left pneumothorax compared to preprocedure imaging. Stable small left pleural effusion. Patient tolerated procedure well. COMPLICATIONS: None immediate FINDINGS: Anterior mediastinal mass was again localized. Little change from prior CT. Small left pneumothorax was present on the preprocedure  CT. Representative core biopsy samples obtained as above. No apparent complication. IMPRESSION: 1. Technically successful CT-guided core biopsy, anterior mediastinal mass. 2. Small left pneumothorax incidentally noted on the preprocedure scan, stable post biopsy. Electronically Signed   By: Lucrezia Europe M.D.   On: 05/04/2018 13:24   Ct Bone Marrow Biopsy & Aspiration  Result Date: 05/14/2018 CLINICAL DATA:  Hodgkin lymphoma and need for bone marrow biopsy for staging prior to treatment. EXAM: CT GUIDED BONE MARROW ASPIRATION AND BIOPSY ANESTHESIA/SEDATION: Versed 4.0 mg IV, Fentanyl 75 mcg IV Total Moderate Sedation Time:   19 minutes. The patient's level of consciousness and physiologic status were continuously monitored during the procedure by Radiology nursing. PROCEDURE: The procedure risks, benefits, and alternatives were explained to the patient. Questions regarding the procedure were encouraged and answered. The patient understands and consents to the procedure. A time out was performed prior to initiating the procedure. The right gluteal region was prepped with chlorhexidine. Sterile gown and sterile gloves were used for the procedure. Local anesthesia was provided with 1% Lidocaine. Under CT guidance, an 11 gauge On Control bone cutting needle was advanced from a posterior approach into the right iliac bone. Needle positioning was confirmed with CT. Initial non heparinized and heparinized aspirate samples were obtained of bone marrow. Core biopsy was performed via the On Control drill needle. COMPLICATIONS: None FINDINGS: Inspection of initial aspirate did reveal visible particles. Intact core biopsy sample was obtained. IMPRESSION: CT guided bone marrow biopsy of right posterior iliac bone with both aspirate and core samples obtained. Electronically Signed   By: Aletta Edouard M.D.   On: 05/14/2018 11:38   Dg C-arm 1-60 Min-no Report  Result Date: 05/02/2018 Fluoroscopy was utilized by the  requesting physician.  No radiographic interpretation.   Ir Imaging Guided Port Insertion  Result Date: 05/14/2018 CLINICAL DATA:  Hodgkin lymphoma and need for porta cath to begin chemotherapy. EXAM: IMPLANTED PORT A CATH PLACEMENT WITH ULTRASOUND AND FLUOROSCOPIC GUIDANCE ANESTHESIA/SEDATION: 2.0 mg IV Versed; 75 mcg IV Fentanyl Total Moderate Sedation Time:  59 minutes The patient's level of consciousness and physiologic status were continuously monitored during the procedure by Radiology nursing. Additional Medications: 1 g IV vancomycin. FLUOROSCOPY TIME:  30 seconds.  7.0 mGy. PROCEDURE: The procedure, risks, benefits, and alternatives were explained to the patient. Questions regarding the procedure were encouraged and answered. The patient understands and consents to the procedure. A time-out was performed prior to initiating the procedure. Ultrasound was utilized to confirm patency of the right internal jugular vein. The right neck and chest were prepped with chlorhexidine in a sterile fashion, and a sterile drape was applied covering the operative field. Maximum barrier sterile technique with sterile gowns and gloves were used for the procedure. Local  anesthesia was provided with 1% lidocaine. After creating a small venotomy incision, a 21 gauge needle was advanced into the right internal jugular vein under direct, real-time ultrasound guidance. Ultrasound image documentation was performed. After securing guidewire access, an 8 Fr dilator was placed. A J-wire was kinked to measure appropriate catheter length. A subcutaneous port pocket was then created along the upper chest wall utilizing sharp and blunt dissection. Portable cautery was utilized. The pocket was irrigated with sterile saline. A single lumen power injectable port was chosen for placement. The 8 Fr catheter was tunneled from the port pocket site to the venotomy incision. The port was placed in the pocket. External catheter was trimmed to  appropriate length based on guidewire measurement. At the venotomy, an 8 Fr peel-away sheath was placed over a guidewire. The catheter was then placed through the sheath and the sheath removed. Final catheter positioning was confirmed and documented with a fluoroscopic spot image. The port was accessed with a needle and aspirated and flushed with heparinized saline. The access needle was removed. The venotomy and port pocket incisions were closed with subcutaneous 4-0 Monocryl and subcuticular 4-0 Monocryl. Dermabond was applied to both incisions. COMPLICATIONS: COMPLICATIONS None FINDINGS: After catheter placement, the tip lies at the cavo-atrial junction. The catheter aspirates normally and is ready for immediate use. IMPRESSION: Placement of single lumen port a cath via right internal jugular vein. The catheter tip lies at the cavo-atrial junction. A power injectable port a cath was placed and is ready for immediate use. Electronically Signed   By: Aletta Edouard M.D.   On: 05/14/2018 11:57    ASSESSMENT: Stage II Hodgkin's lymphoma.  PLAN:    1.  Stage II Hodgkin's lymphoma: CT scan results from April 27, 2018 reviewed independently and reported as above.  PET scan results from May 10, 2018 reviewed independently confirming stage II disease.  Bone marrow biopsy on May 14, 2018 did not reveal any evidence of lymphoma.  MUGA scan from May 11, 2018 revealed an EF of 61%.  PFTs are adequate for treatment.  Patient will receive treatment every 2 weeks and will reimage after 4 treatments.  Given the size of her mediastinal mass, she may benefit from adjuvant XRT at the conclusion of chemotherapy.  Proceed with cycle 1, day 1 of ABVD.  Return to clinic in 1 week for repeat laboratory work and further evaluation and then in 2 weeks for consideration of cycle 2. 2.  Rash: Unclear etiology.  Possibly related to underlying lymphoma.  Patient was given a Medrol Dosepak.  I spent a total of 30  minutes face-to-face with the patient of which greater than 50% of the visit was spent in counseling and coordination of care as detailed above.  Patient expressed understanding and was in agreement with this plan. She also understands that She can call clinic at any time with any questions, concerns, or complaints.   Cancer Staging Hodgkin's lymphoma [facility]) Staging form: Hodgkin and Non-Hodgkin Lymphoma, AJCC 8th Edition - Clinical stage from 05/13/2018: Stage II bulky (Hodgkin lymphoma, A - Asymptomatic) - Signed by Lloyd Huger, MD on 05/13/2018   Lloyd Huger, MD   05/21/2018 3:35 PM

## 2018-05-19 ENCOUNTER — Other Ambulatory Visit: Payer: Self-pay | Admitting: Oncology

## 2018-05-21 ENCOUNTER — Inpatient Hospital Stay (HOSPITAL_BASED_OUTPATIENT_CLINIC_OR_DEPARTMENT_OTHER): Payer: Commercial Managed Care - PPO | Admitting: Oncology

## 2018-05-21 ENCOUNTER — Encounter: Payer: Self-pay | Admitting: Oncology

## 2018-05-21 ENCOUNTER — Inpatient Hospital Stay: Payer: Commercial Managed Care - PPO

## 2018-05-21 ENCOUNTER — Telehealth: Payer: Self-pay | Admitting: *Deleted

## 2018-05-21 VITALS — BP 105/72 | HR 90 | Temp 97.3°F | Resp 16 | Wt 208.0 lb

## 2018-05-21 DIAGNOSIS — R21 Rash and other nonspecific skin eruption: Secondary | ICD-10-CM

## 2018-05-21 DIAGNOSIS — C8192 Hodgkin lymphoma, unspecified, intrathoracic lymph nodes: Secondary | ICD-10-CM | POA: Diagnosis not present

## 2018-05-21 DIAGNOSIS — C8112 Nodular sclerosis classical Hodgkin lymphoma, intrathoracic lymph nodes: Secondary | ICD-10-CM

## 2018-05-21 DIAGNOSIS — Z5111 Encounter for antineoplastic chemotherapy: Secondary | ICD-10-CM | POA: Diagnosis not present

## 2018-05-21 DIAGNOSIS — C8590 Non-Hodgkin lymphoma, unspecified, unspecified site: Secondary | ICD-10-CM

## 2018-05-21 LAB — CBC WITH DIFFERENTIAL/PLATELET
Abs Immature Granulocytes: 0.02 10*3/uL (ref 0.00–0.07)
BASOS PCT: 0 %
Basophils Absolute: 0 10*3/uL (ref 0.0–0.1)
EOS ABS: 0.3 10*3/uL (ref 0.0–0.5)
Eosinophils Relative: 3 %
HCT: 36.2 % (ref 36.0–46.0)
Hemoglobin: 11.7 g/dL — ABNORMAL LOW (ref 12.0–15.0)
Immature Granulocytes: 0 %
Lymphocytes Relative: 11 %
Lymphs Abs: 0.8 10*3/uL (ref 0.7–4.0)
MCH: 27.9 pg (ref 26.0–34.0)
MCHC: 32.3 g/dL (ref 30.0–36.0)
MCV: 86.2 fL (ref 80.0–100.0)
Monocytes Absolute: 0.5 10*3/uL (ref 0.1–1.0)
Monocytes Relative: 7 %
Neutro Abs: 5.7 10*3/uL (ref 1.7–7.7)
Neutrophils Relative %: 79 %
PLATELETS: 269 10*3/uL (ref 150–400)
RBC: 4.2 MIL/uL (ref 3.87–5.11)
RDW: 12.6 % (ref 11.5–15.5)
WBC: 7.3 10*3/uL (ref 4.0–10.5)
nRBC: 0 % (ref 0.0–0.2)

## 2018-05-21 LAB — COMPREHENSIVE METABOLIC PANEL
ALT: 25 U/L (ref 0–44)
ANION GAP: 7 (ref 5–15)
AST: 17 U/L (ref 15–41)
Albumin: 3.7 g/dL (ref 3.5–5.0)
Alkaline Phosphatase: 94 U/L (ref 38–126)
BUN: 15 mg/dL (ref 6–20)
CALCIUM: 8.9 mg/dL (ref 8.9–10.3)
CO2: 24 mmol/L (ref 22–32)
Chloride: 107 mmol/L (ref 98–111)
Creatinine, Ser: 0.71 mg/dL (ref 0.44–1.00)
GFR calc Af Amer: >60 mL/min (ref >60)
GFR calc non Af Amer: >60 mL/min (ref >60)
Glucose, Bld: 100 mg/dL — ABNORMAL HIGH (ref 70–99)
Potassium: 4.3 mmol/L (ref 3.5–5.1)
Sodium: 138 mmol/L (ref 135–145)
Total Bilirubin: 0.3 mg/dL (ref 0.3–1.2)
Total Protein: 7.8 g/dL (ref 6.5–8.1)

## 2018-05-21 LAB — HCG, QUANTITATIVE, PREGNANCY

## 2018-05-21 MED ORDER — PALONOSETRON HCL INJECTION 0.25 MG/5ML
0.2500 mg | Freq: Once | INTRAVENOUS | Status: AC
  Administered 2018-05-21: 0.25 mg via INTRAVENOUS
  Filled 2018-05-21: qty 5

## 2018-05-21 MED ORDER — SODIUM CHLORIDE 0.9 % IV SOLN
Freq: Once | INTRAVENOUS | Status: AC
  Administered 2018-05-21: 10:00:00 via INTRAVENOUS
  Filled 2018-05-21: qty 5

## 2018-05-21 MED ORDER — SODIUM CHLORIDE 0.9 % IV SOLN
10.0000 [IU]/m2 | Freq: Once | INTRAVENOUS | Status: AC
  Administered 2018-05-21: 21 [IU] via INTRAVENOUS
  Filled 2018-05-21: qty 7

## 2018-05-21 MED ORDER — DOXORUBICIN HCL CHEMO IV INJECTION 2 MG/ML
25.0000 mg/m2 | Freq: Once | INTRAVENOUS | Status: AC
  Administered 2018-05-21: 52 mg via INTRAVENOUS
  Filled 2018-05-21: qty 26

## 2018-05-21 MED ORDER — HEPARIN SOD (PORK) LOCK FLUSH 100 UNIT/ML IV SOLN
500.0000 [IU] | Freq: Once | INTRAVENOUS | Status: AC
  Administered 2018-05-21: 500 [IU] via INTRAVENOUS
  Filled 2018-05-21: qty 5

## 2018-05-21 MED ORDER — SODIUM CHLORIDE 0.9% FLUSH
10.0000 mL | Freq: Once | INTRAVENOUS | Status: AC
  Administered 2018-05-21: 10 mL via INTRAVENOUS
  Filled 2018-05-21: qty 10

## 2018-05-21 MED ORDER — PREDNISONE 10 MG (21) PO TBPK: ORAL_TABLET | ORAL | 0 refills | Status: DC

## 2018-05-21 MED ORDER — SODIUM CHLORIDE 0.9 % IV SOLN
375.0000 mg/m2 | Freq: Once | INTRAVENOUS | Status: AC
  Administered 2018-05-21: 780 mg via INTRAVENOUS
  Filled 2018-05-21: qty 78

## 2018-05-21 MED ORDER — VINBLASTINE SULFATE CHEMO INJECTION 1 MG/ML
6.0000 mg/m2 | Freq: Once | INTRAVENOUS | Status: AC
  Administered 2018-05-21: 12.5 mg via INTRAVENOUS
  Filled 2018-05-21: qty 12.5

## 2018-05-21 MED ORDER — SODIUM CHLORIDE 0.9 % IV SOLN
Freq: Once | INTRAVENOUS | Status: AC
  Administered 2018-05-21: 09:00:00 via INTRAVENOUS
  Filled 2018-05-21: qty 250

## 2018-05-21 NOTE — Progress Notes (Signed)
Patient here for evaluation and treatment today. She reports continued itching and rash on her neck, chest and pelvic region. She is using an OTC lidocaine cream for the itching, with some relief.

## 2018-05-21 NOTE — Telephone Encounter (Signed)
Entered in error

## 2018-05-24 ENCOUNTER — Other Ambulatory Visit: Payer: Self-pay | Admitting: Oncology

## 2018-05-25 NOTE — Progress Notes (Signed)
[place]  Telephone:([phone]) [phone] [phone]) [phone]  ID: Misty Combs OB: Oct 30, 1985  MR#: [id number]  PFX#:[id number]  Patient Care Team: Gala Lewandowsky, MD as PCP - General (Family Medicine)  CHIEF COMPLAINT: Stage II Hodgkin's lymphoma.  INTERVAL HISTORY: Patient returns to clinic today for further evaluation and to assess her toleration of treatment.  She noted increased fatigue, but otherwise tolerated her treatment well.  She continues to be active and work full-time.  Her rash has resolved.  She has no neurologic complaints.  She denies any fevers, chills, night sweats, or weight loss.  She denies any further chest pain or shortness of breath.  She has no cough or hemoptysis.  She denies any nausea, vomiting, constipation, or diarrhea.  She has no urinary complaints.  Patient offers no specific complaints today.  REVIEW OF SYSTEMS:   Review of Systems  Constitutional: Positive for malaise/fatigue. Negative for fever and weight loss.  Respiratory: Negative for cough and shortness of breath.   Cardiovascular: Negative for chest pain and leg swelling.  Gastrointestinal: Negative.  Negative for abdominal pain, blood in stool and melena.  Genitourinary: Negative.  Negative for dysuria and flank pain.  Musculoskeletal: Negative.  Negative for back pain.  Skin: Negative.  Negative for itching and rash.  Neurological: Negative.  Negative for focal weakness, weakness and headaches.  Psychiatric/Behavioral: Negative.  The patient is not nervous/anxious.     As per HPI. Otherwise, a complete review of systems is negative.  PAST MEDICAL HISTORY: Past Medical History:  Diagnosis Date  . Asthma    exercise induced  . Migraines    no aura  . UTI (lower urinary tract infection)    post coital    PAST SURGICAL HISTORY: Past Surgical History:  Procedure Laterality Date  . ELECTROMAGNETIC NAVIGATION BROCHOSCOPY Left 05/02/2018   Procedure:  ELECTROMAGNETIC NAVIGATION BRONCHOSCOPY;  Surgeon: Flora Lipps, MD;  Location: [facility] ORS;  Service: Cardiopulmonary;  Laterality: Left;  . ESOPHAGOGASTRODUODENOSCOPY    . IR IMAGING GUIDED PORT INSERTION  05/14/2018  . KNEE SURGERY Left 2016    FAMILY HISTORY: Family History  Problem Relation Age of Onset  . Hypertension Mother   . Heart disease Father     ADVANCED DIRECTIVES (Y/N):  N  HEALTH MAINTENANCE: Social History   Tobacco Use  . Smoking status: Never Smoker  . Smokeless tobacco: Never Used  Substance Use Topics  . Alcohol use: Yes    Alcohol/week: 2.0 standard drinks    Types: 2 Glasses of wine per week  . Drug use: No     Colonoscopy:  PAP:  Bone density:  Lipid panel:  Allergies  Allergen Reactions  . Penicillins Anaphylaxis  . Other     allergic to cats  . Sulfur Nausea And Vomiting    Dizzy  . Tetanus Toxoids Swelling    Fever and swelling  . Gluten Meal Rash    Sensitive to gluten    Current Outpatient Medications  Medication Sig Dispense Refill  . albuterol (PROVENTIL HFA;VENTOLIN HFA) 108 (90 BASE) MCG/ACT inhaler Inhale into the lungs every 6 (six) hours as needed for wheezing or shortness of breath.    Marland Kitchen amitriptyline (ELAVIL) 25 MG tablet Take 25 mg by mouth at bedtime.   1  . azelastine (ASTELIN) 0.1 % nasal spray Place 2 sprays into both nostrils 2 (two) times daily. Use in each nostril as directed     . Bepotastine Besilate (BEPREVE) 1.5 % SOLN Apply 1 drop to  eye 2 (two) times daily.     . budesonide (PULMICORT) 0.5 MG/2ML nebulizer solution USE 2 MLS (0.5 MG TOTAL) BY NEBULIZATION 2 (TWO) TIMES DAILY. NASAL RINSE    . dicyclomine (BENTYL) 20 MG tablet Take 20 mg by mouth 4 (four) times daily as needed for spasms.   1  . fluticasone furoate-vilanterol (BREO ELLIPTA) 100-25 MCG/INH AEPB Inhale 1 puff into the lungs daily.     Marland Kitchen HYDROcodone-acetaminophen (NORCO/VICODIN) 5-325 MG tablet Take 1 tablet by mouth every 6 (six) hours as needed for  moderate pain or severe pain. 20 tablet 0  . hydrOXYzine (VISTARIL) 25 MG capsule Take 1 capsule by mouth.    . levocetirizine (XYZAL) 5 MG tablet Take 5 mg by mouth daily.    Marland Kitchen lidocaine-prilocaine (EMLA) cream Apply to affected area once 30 g 3  . ondansetron (ZOFRAN) 8 MG tablet Take 1 tablet (8 mg total) by mouth 2 (two) times daily as needed. 30 tablet 2  . predniSONE (STERAPRED UNI-PAK 21 TAB) 10 MG (21) TBPK tablet Taper as directed 21 tablet 0  . Prenatal Vit-Fe Fumarate-FA (PRENATAL VITAMIN PO) Take 1 tablet by mouth daily.     . prochlorperazine (COMPAZINE) 10 MG tablet Take 1 tablet (10 mg total) by mouth every 6 (six) hours as needed (Nausea or vomiting). 60 tablet 2   No current facility-administered medications for this visit.     OBJECTIVE: Vitals:   05/28/18 1034  BP: (!) 142/92  Pulse: 100  Temp: (!) 96.5 F (35.8 C)     Body mass index is 34.78 kg/m.    ECOG FS:0 - Asymptomatic  General: Well-developed, well-nourished, no acute distress. Eyes: Pink conjunctiva, anicteric sclera. HEENT: Normocephalic, moist mucous membranes, clear oropharnyx. Lungs: Clear to auscultation bilaterally. Heart: Regular rate and rhythm. No rubs, murmurs, or gallops. Abdomen: Soft, nontender, nondistended. No organomegaly noted, normoactive bowel sounds. Musculoskeletal: No edema, cyanosis, or clubbing. Neuro: Alert, answering all questions appropriately. Cranial nerves grossly intact. Skin: No rashes or petechiae noted. Psych: Normal affect. Lymphatics: Supraclavicular and axillary lymphadenopathy improving.  LAB RESULTS:  Lab Results  Component Value Date   NA 138 05/28/2018   K 4.1 05/28/2018   CL 102 05/28/2018   CO2 27 05/28/2018   GLUCOSE 93 05/28/2018   BUN 23 (H) 05/28/2018   CREATININE 0.74 05/28/2018   CALCIUM 9.0 05/28/2018   PROT 7.7 05/28/2018   ALBUMIN 4.1 05/28/2018   AST 16 05/28/2018   ALT 26 05/28/2018   ALKPHOS 85 05/28/2018   BILITOT 0.5 05/28/2018    GFRNONAA >60 05/28/2018   GFRAA >60 05/28/2018    Lab Results  Component Value Date   WBC 5.1 05/28/2018   NEUTROABS 3.8 05/28/2018   HGB 12.9 05/28/2018   HCT 38.9 05/28/2018   MCV 85.1 05/28/2018   PLT 256 05/28/2018     STUDIES: Dg Chest 1 View  Result Date: 05/04/2018 CLINICAL DATA:  Status post biopsy EXAM: CHEST  1 VIEW COMPARISON:  Chest CT April 27, 2018 FINDINGS: There is a small left apical pneumothorax without tension component. There is again noted a large mass arising in the left upper lobe medially. Lungs elsewhere clear. Heart size and pulmonary vascularity normal. No adenopathy. No bone lesions. IMPRESSION: Small left apical pneumothorax without tension component. Large mass left upper lobe medially. Lungs elsewhere clear. Stable cardiac silhouette. Critical Value/emergent results were called by telephone at the time of interpretation on 05/04/2018 at 1:17 pm to Dr. Arne Cleveland , who verbally  acknowledged these results. Electronically Signed   By: Lowella Grip III M.D.   On: 05/04/2018 13:17   Nm Cardiac Muga Rest  Result Date: 05/11/2018 CLINICAL DATA:  Lymphoma cancer. Evaluate cardiac function in relation to chemotherapy. EXAM: NUCLEAR MEDICINE CARDIAC BLOOD POOL IMAGING (MUGA) TECHNIQUE: Cardiac multi-gated acquisition was performed at rest following intravenous injection of Tc-40mlabeled red blood cells. RADIOPHARMACEUTICALS:  22.8 mCi Tc-910mertechnetate in-vitro labeled red blood cells IV COMPARISON:  None. FINDINGS: No  focal wall motion abnormality of the left ventricle. Calculated left ventricular ejection fraction equals 61% IMPRESSION: Left ventricular ejection fraction equals61 %. Electronically Signed   By: StSuzy Bouchard.D.   On: 05/11/2018 14:05   Nm Pet Image Initial (pi) Skull Base To Thigh  Result Date: 05/10/2018 CLINICAL DATA:  Initial treatment strategy for mediastinal lymphoma. EXAM: NUCLEAR MEDICINE PET SKULL BASE TO THIGH  TECHNIQUE: 10.7 mCi F-18 FDG was injected intravenously. Full-ring PET imaging was performed from the skull base to thigh after the radiotracer. CT data was obtained and used for attenuation correction and anatomic localization. Fasting blood glucose: 981 mg/dl COMPARISON:  Chest CT 04/27/2018 FINDINGS: Mediastinal blood pool activity: SUV max 1.98 NECK: No neck mass or hypermetabolic lymphadenopathy. There is a small subcutaneous nodule noted in the midline of the upper cervical spine posteriorly which measures 10 mm and has an SUV max of 4.09. It could be a subcutaneous lymph node. There is a similar finding more superiorly to the right of midline with SUV max of 3.5. Incidental CT findings: none CHEST: Small bilateral supraclavicular lymph nodes are noted. 6.5 mm left supraclavicular node on image number 63 has an SUV max of 5.05. Left-sided subpectoral nodes are hypermetabolic. The more lateral node measures 8 mm and has an SUV max of 6.65. Largest left axillary lymph node measures 14 mm on image number 76 and has an SUV max of 10.5. Large anterior mediastinal mass is markedly hypermetabolic and has an SUV max of 15.47. Right paratracheal lymph node measures 12 mm on image number 82 and has an SUV max of 9.45. No worrisome pulmonary nodules. There is a small left pleural effusion. There are 2 areas of hypermetabolism involving the right chest wall skin in the region of the right shoulder (CT image 84). No measurable lesions but SUV max is 4.7 posteriorly and 2.6 anteriorly. Incidental CT findings: none ABDOMEN/PELVIS: No abnormal hypermetabolic activity within the liver, pancreas, adrenal glands, or spleen. No hypermetabolic lymph nodes in the abdomen or pelvis. No splenomegaly. No pelvic mass or lymphadenopathy. No inguinal lymphadenopathy. Incidental CT findings: The uterus and ovaries are normal. SKELETON: No focal hypermetabolic activity to suggest skeletal metastasis. Incidental CT findings: none  IMPRESSION: 1. Bulky anterior mediastinal mass is markedly hypermetabolic and consistent with known lymphoma. 2. Bilateral supraclavicular, left subpectoral and left axillary lymphadenopathy. 3. No findings for lymphoma below the diaphragm. 4. A few small vague skin/subcutaneous lesions are hypermetabolic but are of uncertain significance or etiology. Cutaneous/subcutaneous lymphoma is certainly a possibility. Electronically Signed   By: P.Marijo Sanes.D.   On: 05/10/2018 12:25   Ct Biopsy  Result Date: 05/04/2018 CLINICAL DATA:  Anterior mediastinal mass. No known primary malignancy. Nondiagnostic bronchoscopic biopsy 05/02/2018. EXAM: CT GUIDED CORE BIOPSY OF  MEDIASTINAL MASS ANESTHESIA/SEDATION: Intravenous Fentanyl and Versed were administered as conscious sedation during continuous monitoring of the patient?s level of consciousness and physiological / cardiorespiratory status by the radiology RN, with a total moderate sedation time of 14 minutes. PROCEDURE: The  procedure risks, benefits, and alternatives were explained to the patient. Questions regarding the procedure were encouraged and answered. The patient understands and consents to the procedure. Select axial scans of the chest performed with overlying marker. An appropriate skin entry site was determined and marked. The operative field was prepped with chlorhexidinein a sterile fashion, and a sterile drape was applied covering the operative field. A sterile gown and sterile gloves were used for the procedure. Local anesthesia was provided with 1% Lidocaine. Under CT fluoroscopic guidance, a 17 gauge trocar needle was advanced to the margin of the lesion. Once needle tip position was confirmed, coaxial 18-gauge core biopsy samples were obtained, submitted in saline to surgical pathology. The guide needle was removed. Postprocedure scans show no hemorrhage, or other apparent complication. Stable small left pneumothorax compared to preprocedure  imaging. Stable small left pleural effusion. Patient tolerated procedure well. COMPLICATIONS: None immediate FINDINGS: Anterior mediastinal mass was again localized. Little change from prior CT. Small left pneumothorax was present on the preprocedure CT. Representative core biopsy samples obtained as above. No apparent complication. IMPRESSION: 1. Technically successful CT-guided core biopsy, anterior mediastinal mass. 2. Small left pneumothorax incidentally noted on the preprocedure scan, stable post biopsy. Electronically Signed   By: Lucrezia Europe M.D.   On: 05/04/2018 13:24   Ct Bone Marrow Biopsy & Aspiration  Result Date: 05/14/2018 CLINICAL DATA:  Hodgkin lymphoma and need for bone marrow biopsy for staging prior to treatment. EXAM: CT GUIDED BONE MARROW ASPIRATION AND BIOPSY ANESTHESIA/SEDATION: Versed 4.0 mg IV, Fentanyl 75 mcg IV Total Moderate Sedation Time:   19 minutes. The patient's level of consciousness and physiologic status were continuously monitored during the procedure by Radiology nursing. PROCEDURE: The procedure risks, benefits, and alternatives were explained to the patient. Questions regarding the procedure were encouraged and answered. The patient understands and consents to the procedure. A time out was performed prior to initiating the procedure. The right gluteal region was prepped with chlorhexidine. Sterile gown and sterile gloves were used for the procedure. Local anesthesia was provided with 1% Lidocaine. Under CT guidance, an 11 gauge On Control bone cutting needle was advanced from a posterior approach into the right iliac bone. Needle positioning was confirmed with CT. Initial non heparinized and heparinized aspirate samples were obtained of bone marrow. Core biopsy was performed via the On Control drill needle. COMPLICATIONS: None FINDINGS: Inspection of initial aspirate did reveal visible particles. Intact core biopsy sample was obtained. IMPRESSION: CT guided bone marrow  biopsy of right posterior iliac bone with both aspirate and core samples obtained. Electronically Signed   By: Aletta Edouard M.D.   On: 05/14/2018 11:38   Dg C-arm 1-60 Min-no Report  Result Date: 05/02/2018 Fluoroscopy was utilized by the requesting physician.  No radiographic interpretation.   Ir Imaging Guided Port Insertion  Result Date: 05/14/2018 CLINICAL DATA:  Hodgkin lymphoma and need for porta cath to begin chemotherapy. EXAM: IMPLANTED PORT A CATH PLACEMENT WITH ULTRASOUND AND FLUOROSCOPIC GUIDANCE ANESTHESIA/SEDATION: 2.0 mg IV Versed; 75 mcg IV Fentanyl Total Moderate Sedation Time:  59 minutes The patient's level of consciousness and physiologic status were continuously monitored during the procedure by Radiology nursing. Additional Medications: 1 g IV vancomycin. FLUOROSCOPY TIME:  30 seconds.  7.0 mGy. PROCEDURE: The procedure, risks, benefits, and alternatives were explained to the patient. Questions regarding the procedure were encouraged and answered. The patient understands and consents to the procedure. A time-out was performed prior to initiating the procedure. Ultrasound was utilized to  confirm patency of the right internal jugular vein. The right neck and chest were prepped with chlorhexidine in a sterile fashion, and a sterile drape was applied covering the operative field. Maximum barrier sterile technique with sterile gowns and gloves were used for the procedure. Local anesthesia was provided with 1% lidocaine. After creating a small venotomy incision, a 21 gauge needle was advanced into the right internal jugular vein under direct, real-time ultrasound guidance. Ultrasound image documentation was performed. After securing guidewire access, an 8 Fr dilator was placed. A J-wire was kinked to measure appropriate catheter length. A subcutaneous port pocket was then created along the upper chest wall utilizing sharp and blunt dissection. Portable cautery was utilized. The pocket  was irrigated with sterile saline. A single lumen power injectable port was chosen for placement. The 8 Fr catheter was tunneled from the port pocket site to the venotomy incision. The port was placed in the pocket. External catheter was trimmed to appropriate length based on guidewire measurement. At the venotomy, an 8 Fr peel-away sheath was placed over a guidewire. The catheter was then placed through the sheath and the sheath removed. Final catheter positioning was confirmed and documented with a fluoroscopic spot image. The port was accessed with a needle and aspirated and flushed with heparinized saline. The access needle was removed. The venotomy and port pocket incisions were closed with subcutaneous 4-0 Monocryl and subcuticular 4-0 Monocryl. Dermabond was applied to both incisions. COMPLICATIONS: COMPLICATIONS None FINDINGS: After catheter placement, the tip lies at the cavo-atrial junction. The catheter aspirates normally and is ready for immediate use. IMPRESSION: Placement of single lumen port a cath via right internal jugular vein. The catheter tip lies at the cavo-atrial junction. A power injectable port a cath was placed and is ready for immediate use. Electronically Signed   By: Aletta Edouard M.D.   On: 05/14/2018 11:57    ASSESSMENT: Stage II Hodgkin's lymphoma.  PLAN:    1.  Stage II Hodgkin's lymphoma: CT scan results from April 27, 2018 reviewed independently and reported as above.  PET scan results from May 10, 2018 reviewed independently confirming stage II disease.  Bone marrow biopsy on May 14, 2018 did not reveal any evidence of lymphoma.  MUGA scan from May 11, 2018 revealed an EF of 61%.  PFTs are adequate for treatment.  Patient will receive treatment every 2 weeks and will reimage after 4 treatments.  Given the size of her mediastinal mass, she may benefit from adjuvant XRT at the conclusion of chemotherapy.  Patient tolerated cycle 1, day 1 of ABVD last week  without significant side effects.  Will add Zoladex to day 15 of treatment for fertility preservation.  Return to clinic in 1 week as scheduled for consideration of cycle 1, day 15.  2.  Rash: Resolved. 3.  Fertility: Monthly Zoladex as above.   Patient expressed understanding and was in agreement with this plan. She also understands that She can call clinic at any time with any questions, concerns, or complaints.   Cancer Staging Hodgkin's lymphoma [facility]) Staging form: Hodgkin and Non-Hodgkin Lymphoma, AJCC 8th Edition - Clinical stage from 05/13/2018: Stage II bulky (Hodgkin lymphoma, A - Asymptomatic) - Signed by Lloyd Huger, MD on 05/13/2018   Lloyd Huger, MD   05/30/2018 6:38 AM

## 2018-05-28 ENCOUNTER — Inpatient Hospital Stay: Payer: Commercial Managed Care - PPO | Attending: Oncology

## 2018-05-28 ENCOUNTER — Inpatient Hospital Stay (HOSPITAL_BASED_OUTPATIENT_CLINIC_OR_DEPARTMENT_OTHER): Payer: Commercial Managed Care - PPO | Admitting: Oncology

## 2018-05-28 ENCOUNTER — Other Ambulatory Visit: Payer: Self-pay

## 2018-05-28 VITALS — BP 142/92 | HR 100 | Temp 96.5°F | Ht 65.0 in | Wt 209.0 lb

## 2018-05-28 DIAGNOSIS — Z5189 Encounter for other specified aftercare: Secondary | ICD-10-CM | POA: Insufficient documentation

## 2018-05-28 DIAGNOSIS — C8112 Nodular sclerosis classical Hodgkin lymphoma, intrathoracic lymph nodes: Secondary | ICD-10-CM

## 2018-05-28 DIAGNOSIS — Z79899 Other long term (current) drug therapy: Secondary | ICD-10-CM | POA: Diagnosis not present

## 2018-05-28 DIAGNOSIS — D709 Neutropenia, unspecified: Secondary | ICD-10-CM | POA: Diagnosis not present

## 2018-05-28 DIAGNOSIS — C8192 Hodgkin lymphoma, unspecified, intrathoracic lymph nodes: Secondary | ICD-10-CM | POA: Diagnosis not present

## 2018-05-28 DIAGNOSIS — Z5111 Encounter for antineoplastic chemotherapy: Secondary | ICD-10-CM | POA: Insufficient documentation

## 2018-05-28 LAB — COMPREHENSIVE METABOLIC PANEL
ALT: 26 U/L (ref 0–44)
AST: 16 U/L (ref 15–41)
Albumin: 4.1 g/dL (ref 3.5–5.0)
Alkaline Phosphatase: 85 U/L (ref 38–126)
Anion gap: 9 (ref 5–15)
BUN: 23 mg/dL — ABNORMAL HIGH (ref 6–20)
CO2: 27 mmol/L (ref 22–32)
Calcium: 9 mg/dL (ref 8.9–10.3)
Chloride: 102 mmol/L (ref 98–111)
Creatinine, Ser: 0.74 mg/dL (ref 0.44–1.00)
GFR calc Af Amer: >60 mL/min (ref >60)
GFR calc non Af Amer: >60 mL/min (ref >60)
Glucose, Bld: 93 mg/dL (ref 70–99)
Potassium: 4.1 mmol/L (ref 3.5–5.1)
SODIUM: 138 mmol/L (ref 135–145)
Total Bilirubin: 0.5 mg/dL (ref 0.3–1.2)
Total Protein: 7.7 g/dL (ref 6.5–8.1)

## 2018-05-28 LAB — CBC WITH DIFFERENTIAL/PLATELET
Abs Immature Granulocytes: 0.05 10*3/uL (ref 0.00–0.07)
BASOS ABS: 0 10*3/uL (ref 0.0–0.1)
Basophils Relative: 1 %
Eosinophils Absolute: 0.2 10*3/uL (ref 0.0–0.5)
Eosinophils Relative: 4 %
HCT: 38.9 % (ref 36.0–46.0)
Hemoglobin: 12.9 g/dL (ref 12.0–15.0)
Immature Granulocytes: 1 %
LYMPHS ABS: 1 10*3/uL (ref 0.7–4.0)
Lymphocytes Relative: 19 %
MCH: 28.2 pg (ref 26.0–34.0)
MCHC: 33.2 g/dL (ref 30.0–36.0)
MCV: 85.1 fL (ref 80.0–100.0)
Monocytes Absolute: 0.1 10*3/uL (ref 0.1–1.0)
Monocytes Relative: 1 %
NRBC: 0 % (ref 0.0–0.2)
Neutro Abs: 3.8 10*3/uL (ref 1.7–7.7)
Neutrophils Relative %: 74 %
Platelets: 256 10*3/uL (ref 150–400)
RBC: 4.57 MIL/uL (ref 3.87–5.11)
RDW: 12.1 % (ref 11.5–15.5)
WBC: 5.1 10*3/uL (ref 4.0–10.5)

## 2018-05-28 NOTE — Progress Notes (Signed)
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 Patient stated that she had been doing well. After her first treatment, she went home and slept for five hours, but then she did well. Patient denied nausea, vomiting, fever, and chills. Patient also stated that her appetite was not good now.

## 2018-06-01 NOTE — Progress Notes (Signed)
[place]  [phone] [phone]  ID: [name] OB: [birth year]  MR#: [id number]  AOZ#:[id number]  Patient Care Team: Gala Lewandowsky, MD as PCP - General (Family Medicine)  CHIEF COMPLAINT: Stage II Hodgkin's lymphoma.  INTERVAL HISTORY: Patient returns to clinic today for further evaluation and consideration of cycle 1, day 15 of ABVD.  She is tolerating her treatments well without significant side effects. She continues to be active and work full-time.  She has no neurologic complaints.  She denies any fevers, chills, night sweats, or weight loss.  She denies any further chest pain or shortness of breath.  She has no cough or hemoptysis.  She denies any nausea, vomiting, constipation, or diarrhea.  She has no urinary complaints.  Patient offers no specific complaints today.  REVIEW OF SYSTEMS:   Review of Systems  Constitutional: Negative.  Negative for fever, malaise/fatigue and weight loss.  Respiratory: Negative.  Negative for cough and shortness of breath.   Cardiovascular: Negative.  Negative for chest pain and leg swelling.  Gastrointestinal: Negative.  Negative for abdominal pain, blood in stool and melena.  Genitourinary: Negative.  Negative for dysuria and flank pain.  Musculoskeletal: Negative.  Negative for back pain.  Skin: Negative.  Negative for itching and rash.  Neurological: Negative.  Negative for focal weakness, weakness and headaches.  Psychiatric/Behavioral: Negative.  The patient is not nervous/anxious.     As per HPI. Otherwise, a complete review of systems is negative.  PAST MEDICAL HISTORY: Past Medical History:  Diagnosis Date  . Asthma    exercise induced  . Migraines    no aura  . UTI (lower urinary tract infection)    post coital    PAST SURGICAL HISTORY: Past Surgical History:  Procedure Laterality Date  . ELECTROMAGNETIC NAVIGATION BROCHOSCOPY Left 05/02/2018   Procedure:  ELECTROMAGNETIC NAVIGATION BRONCHOSCOPY;  Surgeon: Flora Lipps, MD;  Location: [facility] ORS;  Service: Cardiopulmonary;  Laterality: Left;  . ESOPHAGOGASTRODUODENOSCOPY    . IR IMAGING GUIDED PORT INSERTION  05/14/2018  . KNEE SURGERY Left 2016    FAMILY HISTORY: Family History  Problem Relation Age of Onset  . Hypertension Mother   . Heart disease Father     ADVANCED DIRECTIVES (Y/N):  N  HEALTH MAINTENANCE: Social History   Tobacco Use  . Smoking status: Never Smoker  . Smokeless tobacco: Never Used  Substance Use Topics  . Alcohol use: Yes    Alcohol/week: 2.0 standard drinks    Types: 2 Glasses of wine per week  . Drug use: No     Colonoscopy:  PAP:  Bone density:  Lipid panel:  Allergies  Allergen Reactions  . Penicillins Anaphylaxis  . Other     allergic to cats  . Sulfur Nausea And Vomiting    Dizzy  . Tetanus Toxoids Swelling    Fever and swelling  . Gluten Meal Rash    Sensitive to gluten    Current Outpatient Medications  Medication Sig Dispense Refill  . albuterol (PROVENTIL HFA;VENTOLIN HFA) 108 (90 BASE) MCG/ACT inhaler Inhale into the lungs every 6 (six) hours as needed for wheezing or shortness of breath.    Marland Kitchen amitriptyline (ELAVIL) 25 MG tablet Take 25 mg by mouth at bedtime.   1  . azelastine (ASTELIN) 0.1 % nasal spray Place 2 sprays into both nostrils 2 (two) times daily. Use in each nostril as directed     . Bepotastine Besilate (BEPREVE) 1.5 % SOLN Apply 1 drop to  eye 2 (two) times daily.     . budesonide (PULMICORT) 0.5 MG/2ML nebulizer solution USE 2 MLS (0.5 MG TOTAL) BY NEBULIZATION 2 (TWO) TIMES DAILY. NASAL RINSE    . dicyclomine (BENTYL) 20 MG tablet Take 20 mg by mouth 4 (four) times daily as needed for spasms.   1  . fluticasone furoate-vilanterol (BREO ELLIPTA) 100-25 MCG/INH AEPB Inhale 1 puff into the lungs daily.     Marland Kitchen HYDROcodone-acetaminophen (NORCO/VICODIN) 5-325 MG tablet Take 1 tablet by mouth every 6 (six) hours as needed for  moderate pain or severe pain. 20 tablet 0  . hydrOXYzine (VISTARIL) 25 MG capsule Take 1 capsule by mouth.    . levocetirizine (XYZAL) 5 MG tablet Take 5 mg by mouth daily.    Marland Kitchen lidocaine-prilocaine (EMLA) cream Apply to affected area once 30 g 3  . ondansetron (ZOFRAN) 8 MG tablet Take 1 tablet (8 mg total) by mouth 2 (two) times daily as needed. 30 tablet 2  . predniSONE (STERAPRED UNI-PAK 21 TAB) 10 MG (21) TBPK tablet Taper as directed 21 tablet 0  . Prenatal Vit-Fe Fumarate-FA (PRENATAL VITAMIN PO) Take 1 tablet by mouth daily.     . prochlorperazine (COMPAZINE) 10 MG tablet Take 1 tablet (10 mg total) by mouth every 6 (six) hours as needed (Nausea or vomiting). 60 tablet 2   No current facility-administered medications for this visit.    Facility-Administered Medications Ordered in Other Visits  Medication Dose Route Frequency Provider Last Rate Last Dose  . dacarbazine (DTIC) 780 mg in sodium chloride 0.9 % 250 mL chemo infusion  375 mg/m2 (Treatment Plan Recorded) Intravenous Once Lloyd Huger, MD 328 mL/hr at 06/04/18 1158 780 mg at 06/04/18 1158  . heparin lock flush 100 unit/mL  500 Units Intravenous Once Lloyd Huger, MD      . sodium chloride flush (NS) 0.9 % injection 10 mL  10 mL Intravenous PRN Lloyd Huger, MD   10 mL at 06/04/18 0853    OBJECTIVE: Vitals:   06/04/18 0911  BP: 116/80  Pulse: 79  Resp: 16  Temp: (!) 97.1 F (36.2 C)     Body mass index is 34.78 kg/m.    ECOG FS:0 - Asymptomatic  General: Well-developed, well-nourished, no acute distress. Eyes: Pink conjunctiva, anicteric sclera. HEENT: Normocephalic, moist mucous membranes. Lungs: Clear to auscultation bilaterally. Heart: Regular rate and rhythm. No rubs, murmurs, or gallops. Abdomen: Soft, nontender, nondistended. No organomegaly noted, normoactive bowel sounds. Musculoskeletal: No edema, cyanosis, or clubbing. Neuro: Alert, answering all questions appropriately. Cranial  nerves grossly intact. Skin: No rashes or petechiae noted. Psych: Normal affect. Lymphatics: Supraclavicular and axillary lymphadenopathy improving.  LAB RESULTS:  Lab Results  Component Value Date   NA 137 06/04/2018   K 4.2 06/04/2018   CL 104 06/04/2018   CO2 25 06/04/2018   GLUCOSE 112 (H) 06/04/2018   BUN 14 06/04/2018   CREATININE 0.70 06/04/2018   CALCIUM 9.0 06/04/2018   PROT 7.5 06/04/2018   ALBUMIN 3.9 06/04/2018   AST 24 06/04/2018   ALT 34 06/04/2018   ALKPHOS 82 06/04/2018   BILITOT 0.4 06/04/2018   GFRNONAA >60 06/04/2018   GFRAA >60 06/04/2018    Lab Results  Component Value Date   WBC 2.5 (L) 06/04/2018   NEUTROABS 1.0 (L) 06/04/2018   HGB 12.3 06/04/2018   HCT 37.8 06/04/2018   MCV 85.5 06/04/2018   PLT 247 06/04/2018     STUDIES: Nm Cardiac Muga Rest  Result Date: 05/11/2018 CLINICAL DATA:  Lymphoma cancer. Evaluate cardiac function in relation to chemotherapy. EXAM: NUCLEAR MEDICINE CARDIAC BLOOD POOL IMAGING (MUGA) TECHNIQUE: Cardiac multi-gated acquisition was performed at rest following intravenous injection of Tc-32mlabeled red blood cells. RADIOPHARMACEUTICALS:  22.8 mCi Tc-966mertechnetate in-vitro labeled red blood cells IV COMPARISON:  None. FINDINGS: No  focal wall motion abnormality of the left ventricle. Calculated left ventricular ejection fraction equals 61% IMPRESSION: Left ventricular ejection fraction equals61 %. Electronically Signed   By: StSuzy Bouchard.D.   On: 05/11/2018 14:05   Nm Pet Image Initial (pi) Skull Base To Thigh  Result Date: 05/10/2018 CLINICAL DATA:  Initial treatment strategy for mediastinal lymphoma. EXAM: NUCLEAR MEDICINE PET SKULL BASE TO THIGH TECHNIQUE: 10.7 mCi F-18 FDG was injected intravenously. Full-ring PET imaging was performed from the skull base to thigh after the radiotracer. CT data was obtained and used for attenuation correction and anatomic localization. Fasting blood glucose: 981 mg/dl  COMPARISON:  Chest CT 04/27/2018 FINDINGS: Mediastinal blood pool activity: SUV max 1.98 NECK: No neck mass or hypermetabolic lymphadenopathy. There is a small subcutaneous nodule noted in the midline of the upper cervical spine posteriorly which measures 10 mm and has an SUV max of 4.09. It could be a subcutaneous lymph node. There is a similar finding more superiorly to the right of midline with SUV max of 3.5. Incidental CT findings: none CHEST: Small bilateral supraclavicular lymph nodes are noted. 6.5 mm left supraclavicular node on image number 63 has an SUV max of 5.05. Left-sided subpectoral nodes are hypermetabolic. The more lateral node measures 8 mm and has an SUV max of 6.65. Largest left axillary lymph node measures 14 mm on image number 76 and has an SUV max of 10.5. Large anterior mediastinal mass is markedly hypermetabolic and has an SUV max of 15.47. Right paratracheal lymph node measures 12 mm on image number 82 and has an SUV max of 9.45. No worrisome pulmonary nodules. There is a small left pleural effusion. There are 2 areas of hypermetabolism involving the right chest wall skin in the region of the right shoulder (CT image 84). No measurable lesions but SUV max is 4.7 posteriorly and 2.6 anteriorly. Incidental CT findings: none ABDOMEN/PELVIS: No abnormal hypermetabolic activity within the liver, pancreas, adrenal glands, or spleen. No hypermetabolic lymph nodes in the abdomen or pelvis. No splenomegaly. No pelvic mass or lymphadenopathy. No inguinal lymphadenopathy. Incidental CT findings: The uterus and ovaries are normal. SKELETON: No focal hypermetabolic activity to suggest skeletal metastasis. Incidental CT findings: none IMPRESSION: 1. Bulky anterior mediastinal mass is markedly hypermetabolic and consistent with known lymphoma. 2. Bilateral supraclavicular, left subpectoral and left axillary lymphadenopathy. 3. No findings for lymphoma below the diaphragm. 4. A few small vague  skin/subcutaneous lesions are hypermetabolic but are of uncertain significance or etiology. Cutaneous/subcutaneous lymphoma is certainly a possibility. Electronically Signed   By: P.Marijo Sanes.D.   On: 05/10/2018 12:25   Ct Bone Marrow Biopsy & Aspiration  Result Date: 05/14/2018 CLINICAL DATA:  Hodgkin lymphoma and need for bone marrow biopsy for staging prior to treatment. EXAM: CT GUIDED BONE MARROW ASPIRATION AND BIOPSY ANESTHESIA/SEDATION: Versed 4.0 mg IV, Fentanyl 75 mcg IV Total Moderate Sedation Time:   19 minutes. The patient's level of consciousness and physiologic status were continuously monitored during the procedure by Radiology nursing. PROCEDURE: The procedure risks, benefits, and alternatives were explained to the patient. Questions regarding the procedure were encouraged and answered. The patient understands and consents  to the procedure. A time out was performed prior to initiating the procedure. The right gluteal region was prepped with chlorhexidine. Sterile gown and sterile gloves were used for the procedure. Local anesthesia was provided with 1% Lidocaine. Under CT guidance, an 11 gauge On Control bone cutting needle was advanced from a posterior approach into the right iliac bone. Needle positioning was confirmed with CT. Initial non heparinized and heparinized aspirate samples were obtained of bone marrow. Core biopsy was performed via the On Control drill needle. COMPLICATIONS: None FINDINGS: Inspection of initial aspirate did reveal visible particles. Intact core biopsy sample was obtained. IMPRESSION: CT guided bone marrow biopsy of right posterior iliac bone with both aspirate and core samples obtained. Electronically Signed   By: Aletta Edouard M.D.   On: 05/14/2018 11:38   Ir Imaging Guided Port Insertion  Result Date: 05/14/2018 CLINICAL DATA:  Hodgkin lymphoma and need for porta cath to begin chemotherapy. EXAM: IMPLANTED PORT A CATH PLACEMENT WITH ULTRASOUND AND  FLUOROSCOPIC GUIDANCE ANESTHESIA/SEDATION: 2.0 mg IV Versed; 75 mcg IV Fentanyl Total Moderate Sedation Time:  59 minutes The patient's level of consciousness and physiologic status were continuously monitored during the procedure by Radiology nursing. Additional Medications: 1 g IV vancomycin. FLUOROSCOPY TIME:  30 seconds.  7.0 mGy. PROCEDURE: The procedure, risks, benefits, and alternatives were explained to the patient. Questions regarding the procedure were encouraged and answered. The patient understands and consents to the procedure. A time-out was performed prior to initiating the procedure. Ultrasound was utilized to confirm patency of the right internal jugular vein. The right neck and chest were prepped with chlorhexidine in a sterile fashion, and a sterile drape was applied covering the operative field. Maximum barrier sterile technique with sterile gowns and gloves were used for the procedure. Local anesthesia was provided with 1% lidocaine. After creating a small venotomy incision, a 21 gauge needle was advanced into the right internal jugular vein under direct, real-time ultrasound guidance. Ultrasound image documentation was performed. After securing guidewire access, an 8 Fr dilator was placed. A J-wire was kinked to measure appropriate catheter length. A subcutaneous port pocket was then created along the upper chest wall utilizing sharp and blunt dissection. Portable cautery was utilized. The pocket was irrigated with sterile saline. A single lumen power injectable port was chosen for placement. The 8 Fr catheter was tunneled from the port pocket site to the venotomy incision. The port was placed in the pocket. External catheter was trimmed to appropriate length based on guidewire measurement. At the venotomy, an 8 Fr peel-away sheath was placed over a guidewire. The catheter was then placed through the sheath and the sheath removed. Final catheter positioning was confirmed and documented with a  fluoroscopic spot image. The port was accessed with a needle and aspirated and flushed with heparinized saline. The access needle was removed. The venotomy and port pocket incisions were closed with subcutaneous 4-0 Monocryl and subcuticular 4-0 Monocryl. Dermabond was applied to both incisions. COMPLICATIONS: COMPLICATIONS None FINDINGS: After catheter placement, the tip lies at the cavo-atrial junction. The catheter aspirates normally and is ready for immediate use. IMPRESSION: Placement of single lumen port a cath via right internal jugular vein. The catheter tip lies at the cavo-atrial junction. A power injectable port a cath was placed and is ready for immediate use. Electronically Signed   By: Aletta Edouard M.D.   On: 05/14/2018 11:57    ASSESSMENT: Stage II Hodgkin's lymphoma.  PLAN:    1.  Stage II  Hodgkin's lymphoma: CT scan results from April 27, 2018 reviewed independently and reported as above.  PET scan results from May 10, 2018 reviewed independently confirming stage II disease.  Bone marrow biopsy on May 14, 2018 did not reveal any evidence of lymphoma.  MUGA scan from May 11, 2018 revealed an EF of 61%.  PFTs are adequate for treatment.  Patient will receive treatment every 2 weeks and will reimage after 4 treatments.  Given the size of her mediastinal mass, she may benefit from adjuvant XRT at the conclusion of chemotherapy.  Proceed with cycle 1, day 15 of ABVD today.  Given her relative neutropenia, patient will require Neulasta with the remainder of her cycles.  Return to clinic in 2 days for Neulasta and then in 2 weeks for further evaluation and consideration of cycle 2, day 1. 2.  Fertility preservation: Proceed with monthly Zoladex on day 15 of each treatment. 3.  Neutropenia: Add Neulasta with remainder treatments.     Patient expressed understanding and was in agreement with this plan. She also understands that She can call clinic at any time with any  questions, concerns, or complaints.   Cancer Staging Hodgkin's lymphoma [facility]) Staging form: Hodgkin and Non-Hodgkin Lymphoma, AJCC 8th Edition - Clinical stage from 05/13/2018: Stage II bulky (Hodgkin lymphoma, A - Asymptomatic) - Signed by Lloyd Huger, MD on 05/13/2018   Lloyd Huger, MD   06/04/2018 12:35 PM

## 2018-06-04 ENCOUNTER — Inpatient Hospital Stay (HOSPITAL_BASED_OUTPATIENT_CLINIC_OR_DEPARTMENT_OTHER): Payer: Commercial Managed Care - PPO | Admitting: Oncology

## 2018-06-04 ENCOUNTER — Inpatient Hospital Stay: Payer: Commercial Managed Care - PPO

## 2018-06-04 ENCOUNTER — Other Ambulatory Visit: Payer: Self-pay

## 2018-06-04 ENCOUNTER — Encounter: Payer: Self-pay | Admitting: Oncology

## 2018-06-04 VITALS — BP 116/80 | HR 79 | Temp 97.1°F | Resp 16 | Wt 209.0 lb

## 2018-06-04 DIAGNOSIS — C8112 Nodular sclerosis classical Hodgkin lymphoma, intrathoracic lymph nodes: Secondary | ICD-10-CM

## 2018-06-04 DIAGNOSIS — C8192 Hodgkin lymphoma, unspecified, intrathoracic lymph nodes: Secondary | ICD-10-CM | POA: Diagnosis not present

## 2018-06-04 DIAGNOSIS — D709 Neutropenia, unspecified: Secondary | ICD-10-CM

## 2018-06-04 DIAGNOSIS — Z5111 Encounter for antineoplastic chemotherapy: Secondary | ICD-10-CM | POA: Diagnosis not present

## 2018-06-04 LAB — COMPREHENSIVE METABOLIC PANEL
ALT: 34 U/L (ref 0–44)
AST: 24 U/L (ref 15–41)
Albumin: 3.9 g/dL (ref 3.5–5.0)
Alkaline Phosphatase: 82 U/L (ref 38–126)
Anion gap: 8 (ref 5–15)
BUN: 14 mg/dL (ref 6–20)
CO2: 25 mmol/L (ref 22–32)
Calcium: 9 mg/dL (ref 8.9–10.3)
Chloride: 104 mmol/L (ref 98–111)
Creatinine, Ser: 0.7 mg/dL (ref 0.44–1.00)
GFR calc Af Amer: >60 mL/min (ref >60)
GFR calc non Af Amer: >60 mL/min (ref >60)
Glucose, Bld: 112 mg/dL — ABNORMAL HIGH (ref 70–99)
Potassium: 4.2 mmol/L (ref 3.5–5.1)
Sodium: 137 mmol/L (ref 135–145)
Total Bilirubin: 0.4 mg/dL (ref 0.3–1.2)
Total Protein: 7.5 g/dL (ref 6.5–8.1)

## 2018-06-04 LAB — CBC WITH DIFFERENTIAL/PLATELET
Abs Immature Granulocytes: 0.01 10*3/uL (ref 0.00–0.07)
Basophils Absolute: 0 10*3/uL (ref 0.0–0.1)
Basophils Relative: 1 %
Eosinophils Absolute: 0.1 10*3/uL (ref 0.0–0.5)
Eosinophils Relative: 3 %
HCT: 37.8 % (ref 36.0–46.0)
HEMOGLOBIN: 12.3 g/dL (ref 12.0–15.0)
Immature Granulocytes: 0 %
Lymphocytes Relative: 40 %
Lymphs Abs: 1 10*3/uL (ref 0.7–4.0)
MCH: 27.8 pg (ref 26.0–34.0)
MCHC: 32.5 g/dL (ref 30.0–36.0)
MCV: 85.5 fL (ref 80.0–100.0)
Monocytes Absolute: 0.5 10*3/uL (ref 0.1–1.0)
Monocytes Relative: 18 %
Neutro Abs: 1 10*3/uL — ABNORMAL LOW (ref 1.7–7.7)
Neutrophils Relative %: 38 %
Platelets: 247 10*3/uL (ref 150–400)
RBC: 4.42 MIL/uL (ref 3.87–5.11)
RDW: 12.8 % (ref 11.5–15.5)
WBC: 2.5 10*3/uL — ABNORMAL LOW (ref 4.0–10.5)
nRBC: 0 % (ref 0.0–0.2)

## 2018-06-04 MED ORDER — SODIUM CHLORIDE 0.9 % IV SOLN
375.0000 mg/m2 | Freq: Once | INTRAVENOUS | Status: AC
  Administered 2018-06-04: 780 mg via INTRAVENOUS
  Filled 2018-06-04: qty 78

## 2018-06-04 MED ORDER — SODIUM CHLORIDE 0.9 % IV SOLN
Freq: Once | INTRAVENOUS | Status: AC
  Administered 2018-06-04: 10:00:00 via INTRAVENOUS
  Filled 2018-06-04: qty 5

## 2018-06-04 MED ORDER — SODIUM CHLORIDE 0.9% FLUSH
10.0000 mL | INTRAVENOUS | Status: DC | PRN
  Administered 2018-06-04: 10 mL via INTRAVENOUS
  Filled 2018-06-04: qty 10

## 2018-06-04 MED ORDER — SODIUM CHLORIDE 0.9 % IV SOLN
Freq: Once | INTRAVENOUS | Status: AC
  Administered 2018-06-04: 10:00:00 via INTRAVENOUS
  Filled 2018-06-04: qty 250

## 2018-06-04 MED ORDER — PALONOSETRON HCL INJECTION 0.25 MG/5ML
0.2500 mg | Freq: Once | INTRAVENOUS | Status: AC
  Administered 2018-06-04: 0.25 mg via INTRAVENOUS
  Filled 2018-06-04: qty 5

## 2018-06-04 MED ORDER — VINBLASTINE SULFATE CHEMO INJECTION 1 MG/ML
6.0000 mg/m2 | Freq: Once | INTRAVENOUS | Status: AC
  Administered 2018-06-04: 12.5 mg via INTRAVENOUS
  Filled 2018-06-04: qty 12.5

## 2018-06-04 MED ORDER — DOXORUBICIN HCL CHEMO IV INJECTION 2 MG/ML
25.0000 mg/m2 | Freq: Once | INTRAVENOUS | Status: AC
  Administered 2018-06-04: 52 mg via INTRAVENOUS
  Filled 2018-06-04: qty 26

## 2018-06-04 MED ORDER — SODIUM CHLORIDE 0.9 % IV SOLN
10.0000 [IU]/m2 | Freq: Once | INTRAVENOUS | Status: AC
  Administered 2018-06-04: 21 [IU] via INTRAVENOUS
  Filled 2018-06-04: qty 7

## 2018-06-04 MED ORDER — GOSERELIN ACETATE 3.6 MG ~~LOC~~ IMPL
3.6000 mg | DRUG_IMPLANT | Freq: Once | SUBCUTANEOUS | Status: AC
  Administered 2018-06-04: 3.6 mg via SUBCUTANEOUS
  Filled 2018-06-04: qty 3.6

## 2018-06-04 MED ORDER — HEPARIN SOD (PORK) LOCK FLUSH 100 UNIT/ML IV SOLN
500.0000 [IU] | Freq: Once | INTRAVENOUS | Status: AC
  Administered 2018-06-04: 500 [IU] via INTRAVENOUS
  Filled 2018-06-04: qty 5

## 2018-06-04 NOTE — Progress Notes (Signed)
Patient here for evaluation and treatment today with ABVD. She reports having a slight sore throat last week that is gone now. She denies having any fevers.

## 2018-06-06 ENCOUNTER — Encounter (HOSPITAL_COMMUNITY): Payer: Self-pay

## 2018-06-06 ENCOUNTER — Inpatient Hospital Stay: Payer: Commercial Managed Care - PPO

## 2018-06-06 DIAGNOSIS — C8112 Nodular sclerosis classical Hodgkin lymphoma, intrathoracic lymph nodes: Secondary | ICD-10-CM

## 2018-06-06 DIAGNOSIS — Z5111 Encounter for antineoplastic chemotherapy: Secondary | ICD-10-CM | POA: Diagnosis not present

## 2018-06-06 MED ORDER — PEGFILGRASTIM INJECTION 6 MG/0.6ML ~~LOC~~
6.0000 mg | PREFILLED_SYRINGE | Freq: Once | SUBCUTANEOUS | Status: AC
  Administered 2018-06-06: 6 mg via SUBCUTANEOUS

## 2018-06-07 ENCOUNTER — Telehealth: Payer: Self-pay | Admitting: *Deleted

## 2018-06-07 ENCOUNTER — Encounter: Payer: Self-pay | Admitting: Oncology

## 2018-06-07 NOTE — Telephone Encounter (Signed)
Yes please

## 2018-06-07 NOTE — Telephone Encounter (Signed)
Prescription faxed and patient informed

## 2018-06-07 NOTE — Telephone Encounter (Signed)
Patient called asking for mouthrinse for her mouth sores. Please advise if you want to send prescription for medicated mouth wash

## 2018-06-17 NOTE — Progress Notes (Signed)
Misty Combs  [phone]) [phone] [phone]) [phone]  ID: Misty Combs OB: Misty Combs  MR#: [id number]  EHU#:[id number]  Patient Care Team: Gala Lewandowsky, MD as PCP - General (Family Medicine)  CHIEF COMPLAINT: Stage II Hodgkin's lymphoma.  INTERVAL HISTORY: Patient returns to clinic today for further evaluation and consideration of cycle 2, day 1 of ABVD.  She continues to tolerate her treatments well without significant side effects.  She reports mouth sores that have resolved.  She continues to be active and work full-time.  She has no neurologic complaints.  She denies any fevers, chills, night sweats, or weight loss.  She denies any further chest pain or shortness of breath.  She has no cough or hemoptysis.  She denies any nausea, vomiting, constipation, or diarrhea.  She has no urinary complaints.  Patient offers no specific complaints today.  REVIEW OF SYSTEMS:   Review of Systems  Constitutional: Negative.  Negative for fever, malaise/fatigue and weight loss.  Respiratory: Negative.  Negative for cough and shortness of breath.   Cardiovascular: Negative.  Negative for chest pain and leg swelling.  Gastrointestinal: Negative.  Negative for abdominal pain, blood in stool and melena.  Genitourinary: Negative.  Negative for dysuria and flank pain.  Musculoskeletal: Negative.  Negative for back pain.  Skin: Negative.  Negative for itching and rash.  Neurological: Negative.  Negative for focal weakness, weakness and headaches.  Psychiatric/Behavioral: Negative.  The patient is not nervous/anxious.     As per HPI. Otherwise, a complete review of systems is negative.  PAST MEDICAL HISTORY: Past Medical History:  Diagnosis Date  . Asthma    exercise induced  . Migraines    no aura  . UTI (lower urinary tract infection)    post coital    PAST SURGICAL HISTORY: Past Surgical History:  Procedure Laterality Date  . ELECTROMAGNETIC NAVIGATION  BROCHOSCOPY Left 05/02/2018   Procedure: ELECTROMAGNETIC NAVIGATION BRONCHOSCOPY;  Surgeon: Flora Lipps, MD;  Location: [facility] ORS;  Service: Cardiopulmonary;  Laterality: Left;  . ESOPHAGOGASTRODUODENOSCOPY    . IR IMAGING GUIDED PORT INSERTION  05/14/2018  . KNEE SURGERY Left 2016    FAMILY HISTORY: Family History  Problem Relation Age of Onset  . Hypertension Mother   . Heart disease Father     ADVANCED DIRECTIVES (Y/N):  N  HEALTH MAINTENANCE: Social History   Tobacco Use  . Smoking status: Never Smoker  . Smokeless tobacco: Never Used  Substance Use Topics  . Alcohol use: Yes    Alcohol/week: 2.0 standard drinks    Types: 2 Glasses of wine per week  . Drug use: No     Colonoscopy:  PAP:  Bone density:  Lipid panel:  Allergies  Allergen Reactions  . Penicillins Anaphylaxis  . Other     allergic to cats  . Sulfur Nausea And Vomiting    Dizzy  . Tetanus Toxoids Swelling    Fever and swelling  . Gluten Meal Rash    Sensitive to gluten    Current Outpatient Medications  Medication Sig Dispense Refill  . albuterol (PROVENTIL HFA;VENTOLIN HFA) 108 (90 BASE) MCG/ACT inhaler Inhale into the lungs every 6 (six) hours as needed for wheezing or shortness of breath.    Marland Kitchen amitriptyline (ELAVIL) 25 MG tablet Take 25 mg by mouth at bedtime.   1  . azelastine (ASTELIN) 0.1 % nasal spray Place 2 sprays into both nostrils 2 (two) times daily. Use in each nostril as directed     .  Bepotastine Besilate (BEPREVE) 1.5 % SOLN Apply 1 drop to eye 2 (two) times daily.     . budesonide (PULMICORT) 0.5 MG/2ML nebulizer solution USE 2 MLS (0.5 MG TOTAL) BY NEBULIZATION 2 (TWO) TIMES DAILY. NASAL RINSE    . dicyclomine (BENTYL) 20 MG tablet Take 20 mg by mouth 4 (four) times daily as needed for spasms.   1  . fluticasone furoate-vilanterol (BREO ELLIPTA) 100-25 MCG/INH AEPB Inhale 1 puff into the lungs daily.     Marland Kitchen HYDROcodone-acetaminophen (NORCO/VICODIN) 5-325 MG tablet Take 1 tablet  by mouth every 6 (six) hours as needed for moderate pain or severe pain. 20 tablet 0  . hydrOXYzine (VISTARIL) 25 MG capsule Take 1 capsule by mouth.    . levocetirizine (XYZAL) 5 MG tablet Take 5 mg by mouth daily.    Marland Kitchen lidocaine-prilocaine (EMLA) cream Apply to affected area once 30 g 3  . ondansetron (ZOFRAN) 8 MG tablet Take 1 tablet (8 mg total) by mouth 2 (two) times daily as needed. 30 tablet 2  . predniSONE (STERAPRED UNI-PAK 21 TAB) 10 MG (21) TBPK tablet Taper as directed 21 tablet 0  . Prenatal Vit-Fe Fumarate-FA (PRENATAL VITAMIN PO) Take 1 tablet by mouth daily.     . prochlorperazine (COMPAZINE) 10 MG tablet Take 1 tablet (10 mg total) by mouth every 6 (six) hours as needed (Nausea or vomiting). 60 tablet 2   No current facility-administered medications for this visit.    Facility-Administered Medications Ordered in Other Visits  Medication Dose Route Frequency Provider Last Rate Last Dose  . bleomycin (BLEOCIN) 21 Units in sodium chloride 0.9 % 50 mL chemo infusion  10 Units/m2 (Treatment Plan Recorded) Intravenous Once Lloyd Huger, MD   Stopped at 06/18/18 1205  . dacarbazine (DTIC) 780 mg in sodium chloride 0.9 % 250 mL chemo infusion  375 mg/m2 (Treatment Plan Recorded) Intravenous Once Lloyd Huger, MD      . heparin lock flush 100 unit/mL  500 Units Intracatheter Once PRN Lloyd Huger, MD        OBJECTIVE: Vitals:   06/18/18 0839  BP: 125/86  Pulse: 87  Temp: 97.8 F (36.6 C)     Body mass index is 35.88 kg/m.    ECOG FS:0 - Asymptomatic  General: Well-developed, well-nourished, no acute distress. Eyes: Pink conjunctiva, anicteric sclera. HEENT: Normocephalic, moist mucous membranes, clear oropharnyx. Lungs: Clear to auscultation bilaterally. Heart: Regular rate and rhythm. No rubs, murmurs, or gallops. Abdomen: Soft, nontender, nondistended. No organomegaly noted, normoactive bowel sounds. Musculoskeletal: No edema, cyanosis, or  clubbing. Neuro: Alert, answering all questions appropriately. Cranial nerves grossly intact. Skin: No rashes or petechiae noted. Psych: Normal affect. Lymphatics: No cervical, calvicular, axillary or inguinal LAD.  LAB RESULTS:  Lab Results  Component Value Date   NA 140 06/18/2018   K 4.0 06/18/2018   CL 109 06/18/2018   CO2 23 06/18/2018   GLUCOSE 115 (H) 06/18/2018   BUN 12 06/18/2018   CREATININE 0.63 06/18/2018   CALCIUM 8.7 (L) 06/18/2018   PROT 6.9 06/18/2018   ALBUMIN 3.5 06/18/2018   AST 20 06/18/2018   ALT 27 06/18/2018   ALKPHOS 72 06/18/2018   BILITOT 0.4 06/18/2018   GFRNONAA >60 06/18/2018   GFRAA >60 06/18/2018    Lab Results  Component Value Date   WBC 11.1 (H) 06/18/2018   NEUTROABS 7.8 (H) 06/18/2018   HGB 10.8 (L) 06/18/2018   HCT 33.1 (L) 06/18/2018   MCV 86.4 06/18/2018  PLT 130 (L) 06/18/2018     STUDIES: No results found.  ASSESSMENT: Stage II Hodgkin's lymphoma.  PLAN:    1.  Stage II Hodgkin's lymphoma: CT scan results from April 27, 2018 reviewed independently and reported as above.  PET scan results from May 10, 2018 reviewed independently confirming stage II disease.  Bone marrow biopsy on May 14, 2018 did not reveal any evidence of lymphoma.  MUGA scan from May 11, 2018 revealed an EF of 61%.  PFTs are adequate for treatment.  Patient will receive treatment every 2 weeks and will reimage after 4 treatments.  Given the size of her mediastinal mass, she may benefit from adjuvant XRT at the conclusion of chemotherapy.  Proceed with cycle 2, day 1 of ABVD today.  Patient appears to only require Neulasta after day 15 of each cycle.  Return to clinic in 2 weeks for further evaluation and consideration of cycle 2, day 15.  Repeat PET scan prior to initiation of cycle 3.   2.  Fertility preservation: Proceed with monthly Zoladex on day 15 of each treatment. 3.  Neutropenia: Continue after Neulasta on day 15 of each  treatment.   Patient expressed understanding and was in agreement with this plan. She also understands that She can call clinic at any time with any questions, concerns, or complaints.   Cancer Staging Hodgkin's lymphoma [facility]) Staging form: Hodgkin and Non-Hodgkin Lymphoma, AJCC 8th Edition - Clinical stage from 05/13/2018: Stage II bulky (Hodgkin lymphoma, A - Asymptomatic) - Signed by Lloyd Huger, MD on 05/13/2018   Lloyd Huger, MD   06/18/2018 11:58 AM

## 2018-06-18 ENCOUNTER — Inpatient Hospital Stay (HOSPITAL_BASED_OUTPATIENT_CLINIC_OR_DEPARTMENT_OTHER): Payer: Commercial Managed Care - PPO | Admitting: Oncology

## 2018-06-18 ENCOUNTER — Other Ambulatory Visit: Payer: Self-pay

## 2018-06-18 ENCOUNTER — Inpatient Hospital Stay: Payer: Commercial Managed Care - PPO

## 2018-06-18 VITALS — BP 125/86 | HR 87 | Temp 97.8°F | Ht 65.0 in | Wt 215.6 lb

## 2018-06-18 DIAGNOSIS — C8192 Hodgkin lymphoma, unspecified, intrathoracic lymph nodes: Secondary | ICD-10-CM | POA: Diagnosis not present

## 2018-06-18 DIAGNOSIS — C8112 Nodular sclerosis classical Hodgkin lymphoma, intrathoracic lymph nodes: Secondary | ICD-10-CM

## 2018-06-18 DIAGNOSIS — Z5111 Encounter for antineoplastic chemotherapy: Secondary | ICD-10-CM | POA: Diagnosis not present

## 2018-06-18 DIAGNOSIS — D709 Neutropenia, unspecified: Secondary | ICD-10-CM | POA: Diagnosis not present

## 2018-06-18 LAB — CBC WITH DIFFERENTIAL/PLATELET
Abs Immature Granulocytes: 0.97 10*3/uL — ABNORMAL HIGH (ref 0.00–0.07)
Basophils Absolute: 0.1 10*3/uL (ref 0.0–0.1)
Basophils Relative: 1 %
EOS ABS: 0.1 10*3/uL (ref 0.0–0.5)
Eosinophils Relative: 0 %
HCT: 33.1 % — ABNORMAL LOW (ref 36.0–46.0)
HEMOGLOBIN: 10.8 g/dL — AB (ref 12.0–15.0)
Immature Granulocytes: 9 %
LYMPHS ABS: 1.4 10*3/uL (ref 0.7–4.0)
Lymphocytes Relative: 12 %
MCH: 28.2 pg (ref 26.0–34.0)
MCHC: 32.6 g/dL (ref 30.0–36.0)
MCV: 86.4 fL (ref 80.0–100.0)
Monocytes Absolute: 0.9 10*3/uL (ref 0.1–1.0)
Monocytes Relative: 8 %
Neutro Abs: 7.8 10*3/uL — ABNORMAL HIGH (ref 1.7–7.7)
Neutrophils Relative %: 70 %
Platelets: 130 10*3/uL — ABNORMAL LOW (ref 150–400)
RBC: 3.83 MIL/uL — ABNORMAL LOW (ref 3.87–5.11)
RDW: 13 % (ref 11.5–15.5)
WBC: 11.1 10*3/uL — ABNORMAL HIGH (ref 4.0–10.5)
nRBC: 0.3 % — ABNORMAL HIGH (ref 0.0–0.2)

## 2018-06-18 LAB — COMPREHENSIVE METABOLIC PANEL
ALT: 27 U/L (ref 0–44)
AST: 20 U/L (ref 15–41)
Albumin: 3.5 g/dL (ref 3.5–5.0)
Alkaline Phosphatase: 72 U/L (ref 38–126)
Anion gap: 8 (ref 5–15)
BUN: 12 mg/dL (ref 6–20)
CO2: 23 mmol/L (ref 22–32)
Calcium: 8.7 mg/dL — ABNORMAL LOW (ref 8.9–10.3)
Chloride: 109 mmol/L (ref 98–111)
Creatinine, Ser: 0.63 mg/dL (ref 0.44–1.00)
GFR calc Af Amer: >60 mL/min (ref >60)
GFR calc non Af Amer: >60 mL/min (ref >60)
Glucose, Bld: 115 mg/dL — ABNORMAL HIGH (ref 70–99)
Potassium: 4 mmol/L (ref 3.5–5.1)
Sodium: 140 mmol/L (ref 135–145)
Total Bilirubin: 0.4 mg/dL (ref 0.3–1.2)
Total Protein: 6.9 g/dL (ref 6.5–8.1)

## 2018-06-18 MED ORDER — SODIUM CHLORIDE 0.9 % IV SOLN
Freq: Once | INTRAVENOUS | Status: AC
  Administered 2018-06-18: 10:00:00 via INTRAVENOUS
  Filled 2018-06-18: qty 5

## 2018-06-18 MED ORDER — PALONOSETRON HCL INJECTION 0.25 MG/5ML
0.2500 mg | Freq: Once | INTRAVENOUS | Status: AC
  Administered 2018-06-18: 0.25 mg via INTRAVENOUS
  Filled 2018-06-18: qty 5

## 2018-06-18 MED ORDER — SODIUM CHLORIDE 0.9 % IV SOLN
375.0000 mg/m2 | Freq: Once | INTRAVENOUS | Status: AC
  Administered 2018-06-18: 780 mg via INTRAVENOUS
  Filled 2018-06-18: qty 78

## 2018-06-18 MED ORDER — SODIUM CHLORIDE 0.9 % IV SOLN
Freq: Once | INTRAVENOUS | Status: AC
  Administered 2018-06-18: 10:00:00 via INTRAVENOUS
  Filled 2018-06-18: qty 250

## 2018-06-18 MED ORDER — VINBLASTINE SULFATE CHEMO INJECTION 1 MG/ML
6.0000 mg/m2 | Freq: Once | INTRAVENOUS | Status: AC
  Administered 2018-06-18: 12.5 mg via INTRAVENOUS
  Filled 2018-06-18: qty 12.5

## 2018-06-18 MED ORDER — HEPARIN SOD (PORK) LOCK FLUSH 100 UNIT/ML IV SOLN
500.0000 [IU] | Freq: Once | INTRAVENOUS | Status: AC | PRN
  Administered 2018-06-18: 500 [IU]
  Filled 2018-06-18: qty 5

## 2018-06-18 MED ORDER — SODIUM CHLORIDE 0.9 % IV SOLN
10.0000 [IU]/m2 | Freq: Once | INTRAVENOUS | Status: AC
  Administered 2018-06-18: 21 [IU] via INTRAVENOUS
  Filled 2018-06-18: qty 7

## 2018-06-18 MED ORDER — DOXORUBICIN HCL CHEMO IV INJECTION 2 MG/ML
25.0000 mg/m2 | Freq: Once | INTRAVENOUS | Status: AC
  Administered 2018-06-18: 52 mg via INTRAVENOUS
  Filled 2018-06-18: qty 25

## 2018-06-18 NOTE — Progress Notes (Signed)
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 Patient stated that she had been doing okay. Patient stated that she feels tired all the time and had sores in her mouth but doing better from that.

## 2018-06-19 ENCOUNTER — Ambulatory Visit: Payer: Commercial Managed Care - PPO | Admitting: Certified Nurse Midwife

## 2018-06-20 ENCOUNTER — Inpatient Hospital Stay: Payer: Commercial Managed Care - PPO

## 2018-06-29 NOTE — Progress Notes (Signed)
[place]  Telephone:(336) [phone] Fax:(336) [phone]  ID: Misty Combs OB: [birth year]  MR#: [id number]  GOT#:[id number]  Patient Care Team: Gala Lewandowsky, MD as PCP - General (Family Medicine)  CHIEF COMPLAINT: Stage II Hodgkin's lymphoma.  INTERVAL HISTORY: Patient returns to clinic today for further evaluation and consideration of cycle 2, day 15 of ABVD.  She continues to tolerate her treatments well without significant side effects. She continues to be active and work full-time.  She has no neurologic complaints.  She denies any fevers, chills, night sweats, or weight loss.  She denies any further chest pain or shortness of breath.  She has no cough or hemoptysis.  She denies any nausea, vomiting, constipation, or diarrhea.  She has no urinary complaints.  Patient offers no specific complaints today.  REVIEW OF SYSTEMS:   Review of Systems  Constitutional: Negative.  Negative for fever, malaise/fatigue and weight loss.  Respiratory: Negative.  Negative for cough and shortness of breath.   Cardiovascular: Negative.  Negative for chest pain and leg swelling.  Gastrointestinal: Negative.  Negative for abdominal pain, blood in stool and melena.  Genitourinary: Negative.  Negative for dysuria and flank pain.  Musculoskeletal: Negative.  Negative for back pain.  Skin: Negative.  Negative for itching and rash.  Neurological: Negative.  Negative for focal weakness, weakness and headaches.  Psychiatric/Behavioral: Negative.  The patient is not nervous/anxious.     As per HPI. Otherwise, a complete review of systems is negative.  PAST MEDICAL HISTORY: Past Medical History:  Diagnosis Date  . Asthma    exercise induced  . Migraines    no aura  . UTI (lower urinary tract infection)    post coital    PAST SURGICAL HISTORY: Past Surgical History:  Procedure Laterality Date  . ELECTROMAGNETIC NAVIGATION BROCHOSCOPY Left 05/02/2018   Procedure:  ELECTROMAGNETIC NAVIGATION BRONCHOSCOPY;  Surgeon: Flora Lipps, MD;  Location: [facility] ORS;  Service: Cardiopulmonary;  Laterality: Left;  . ESOPHAGOGASTRODUODENOSCOPY    . IR IMAGING GUIDED PORT INSERTION  05/14/2018  . KNEE SURGERY Left 2016    FAMILY HISTORY: Family History  Problem Relation Age of Onset  . Hypertension Mother   . Heart disease Father     ADVANCED DIRECTIVES (Y/N):  N  HEALTH MAINTENANCE: Social History   Tobacco Use  . Smoking status: Never Smoker  . Smokeless tobacco: Never Used  Substance Use Topics  . Alcohol use: Yes    Alcohol/week: 2.0 standard drinks    Types: 2 Glasses of wine per week  . Drug use: No     Colonoscopy:  PAP:  Bone density:  Lipid panel:  Allergies  Allergen Reactions  . Penicillins Anaphylaxis  . Other     allergic to cats  . Sulfur Nausea And Vomiting    Dizzy  . Tetanus Toxoids Swelling    Fever and swelling  . Gluten Meal Rash    Sensitive to gluten    Current Outpatient Medications  Medication Sig Dispense Refill  . albuterol (PROVENTIL HFA;VENTOLIN HFA) 108 (90 BASE) MCG/ACT inhaler Inhale into the lungs every 6 (six) hours as needed for wheezing or shortness of breath.    Marland Kitchen amitriptyline (ELAVIL) 25 MG tablet Take 25 mg by mouth at bedtime.   1  . azelastine (ASTELIN) 0.1 % nasal spray Place 2 sprays into both nostrils 2 (two) times daily. Use in each nostril as directed     . Bepotastine Besilate (BEPREVE) 1.5 % SOLN Apply 1 drop  to eye 2 (two) times daily.     . budesonide (PULMICORT) 0.5 MG/2ML nebulizer solution USE 2 MLS (0.5 MG TOTAL) BY NEBULIZATION 2 (TWO) TIMES DAILY. NASAL RINSE    . dicyclomine (BENTYL) 20 MG tablet Take 20 mg by mouth 4 (four) times daily as needed for spasms.   1  . fluticasone furoate-vilanterol (BREO ELLIPTA) 100-25 MCG/INH AEPB Inhale 1 puff into the lungs daily.     Marland Kitchen HYDROcodone-acetaminophen (NORCO/VICODIN) 5-325 MG tablet Take 1 tablet by mouth every 6 (six) hours as needed for  moderate pain or severe pain. 20 tablet 0  . hydrOXYzine (VISTARIL) 25 MG capsule Take 1 capsule by mouth.    . levocetirizine (XYZAL) 5 MG tablet Take 5 mg by mouth daily.    Marland Kitchen lidocaine-prilocaine (EMLA) cream Apply to affected area once 30 g 3  . ondansetron (ZOFRAN) 8 MG tablet Take 1 tablet (8 mg total) by mouth 2 (two) times daily as needed. 30 tablet 2  . predniSONE (STERAPRED UNI-PAK 21 TAB) 10 MG (21) TBPK tablet Taper as directed 21 tablet 0  . Prenatal Vit-Fe Fumarate-FA (PRENATAL VITAMIN PO) Take 1 tablet by mouth daily.     . prochlorperazine (COMPAZINE) 10 MG tablet Take 1 tablet (10 mg total) by mouth every 6 (six) hours as needed (Nausea or vomiting). 60 tablet 2   No current facility-administered medications for this visit.    Facility-Administered Medications Ordered in Other Visits  Medication Dose Route Frequency Provider Last Rate Last Dose  . heparin lock flush 100 unit/mL  500 Units Intracatheter Once PRN Lloyd Huger, MD      . sodium chloride flush (NS) 0.9 % injection 10 mL  10 mL Intravenous PRN Lloyd Huger, MD   10 mL at 07/02/18 0828    OBJECTIVE: Vitals:   07/02/18 0848  BP: 120/77  Pulse: 96  Temp: 97.8 F (36.6 C)     Body mass index is 36.01 kg/m.    ECOG FS:0 - Asymptomatic  General: Well-developed, well-nourished, no acute distress. Eyes: Pink conjunctiva, anicteric sclera. HEENT: Normocephalic, moist mucous membranes, clear oropharnyx. Lungs: Clear to auscultation bilaterally. Heart: Regular rate and rhythm. No rubs, murmurs, or gallops. Abdomen: Soft, nontender, nondistended. No organomegaly noted, normoactive bowel sounds. Musculoskeletal: No edema, cyanosis, or clubbing. Neuro: Alert, answering all questions appropriately. Cranial nerves grossly intact. Skin: No rashes or petechiae noted. Psych: Normal affect. Lymphatics: No cervical, calvicular, axillary or inguinal LAD.  LAB RESULTS:  Lab Results  Component Value Date     NA 138 07/02/2018   K 4.2 07/02/2018   CL 108 07/02/2018   CO2 24 07/02/2018   GLUCOSE 130 (H) 07/02/2018   BUN 12 07/02/2018   CREATININE 0.66 07/02/2018   CALCIUM 8.9 07/02/2018   PROT 7.0 07/02/2018   ALBUMIN 3.7 07/02/2018   AST 23 07/02/2018   ALT 30 07/02/2018   ALKPHOS 70 07/02/2018   BILITOT 0.3 07/02/2018   GFRNONAA >60 07/02/2018   GFRAA >60 07/02/2018    Lab Results  Component Value Date   WBC 2.4 (L) 07/02/2018   NEUTROABS 1.0 (L) 07/02/2018   HGB 10.9 (L) 07/02/2018   HCT 33.5 (L) 07/02/2018   MCV 86.1 07/02/2018   PLT 280 07/02/2018     STUDIES: No results found.  ASSESSMENT: Stage II Hodgkin's lymphoma.  PLAN:    1.  Stage II Hodgkin's lymphoma: CT scan results from April 27, 2018 reviewed independently and reported as above.  PET scan results  from May 10, 2018 reviewed independently confirming stage II disease.  Bone marrow biopsy on May 14, 2018 did not reveal any evidence of lymphoma.  MUGA scan from May 11, 2018 revealed an EF of 61%.  PFTs are adequate for treatment.  Patient will receive treatment every 2 weeks and will reimage after 4 treatments.  Given the size of her mediastinal mass, she may benefit from adjuvant XRT at the conclusion of chemotherapy.  Proceed with cycle 2, day 15 of ABVD today.  Patient appears to only require Neulasta after day 15 of each cycle and therefore will return to 2 days to receive her injection.  Return to clinic in 2 weeks for further evaluation and consideration of cycle 3, day 1.  Will repeat PET scan prior to next infusion.   2.  Fertility preservation: Continue monthly Zoladex on day 15 of each treatment. 3.  Neutropenia: Continue after Neulasta on day 15 of each treatment. 4.  Anemia: Mild, monitor.   Patient expressed understanding and was in agreement with this plan. She also understands that She can call clinic at any time with any questions, concerns, or complaints.   Cancer  Staging Hodgkin's lymphoma [facility]) Staging form: Hodgkin and Non-Hodgkin Lymphoma, AJCC 8th Edition - Clinical stage from 05/13/2018: Stage II bulky (Hodgkin lymphoma, A - Asymptomatic) - Signed by Lloyd Huger, MD on 05/13/2018   Lloyd Huger, MD   07/02/2018 6:06 PM

## 2018-07-02 ENCOUNTER — Inpatient Hospital Stay: Payer: Commercial Managed Care - PPO | Attending: Oncology

## 2018-07-02 ENCOUNTER — Inpatient Hospital Stay (HOSPITAL_BASED_OUTPATIENT_CLINIC_OR_DEPARTMENT_OTHER): Payer: Commercial Managed Care - PPO | Admitting: Oncology

## 2018-07-02 ENCOUNTER — Inpatient Hospital Stay: Payer: Commercial Managed Care - PPO

## 2018-07-02 ENCOUNTER — Other Ambulatory Visit: Payer: Self-pay

## 2018-07-02 VITALS — BP 120/77 | HR 96 | Temp 97.8°F | Ht 65.0 in | Wt 216.4 lb

## 2018-07-02 DIAGNOSIS — Z5189 Encounter for other specified aftercare: Secondary | ICD-10-CM | POA: Insufficient documentation

## 2018-07-02 DIAGNOSIS — Z5111 Encounter for antineoplastic chemotherapy: Secondary | ICD-10-CM | POA: Diagnosis present

## 2018-07-02 DIAGNOSIS — C8112 Nodular sclerosis classical Hodgkin lymphoma, intrathoracic lymph nodes: Secondary | ICD-10-CM

## 2018-07-02 DIAGNOSIS — D709 Neutropenia, unspecified: Secondary | ICD-10-CM | POA: Diagnosis not present

## 2018-07-02 DIAGNOSIS — C8192 Hodgkin lymphoma, unspecified, intrathoracic lymph nodes: Secondary | ICD-10-CM

## 2018-07-02 DIAGNOSIS — D649 Anemia, unspecified: Secondary | ICD-10-CM | POA: Diagnosis not present

## 2018-07-02 LAB — CBC WITH DIFFERENTIAL/PLATELET
Abs Immature Granulocytes: 0.01 10*3/uL (ref 0.00–0.07)
Basophils Absolute: 0 10*3/uL (ref 0.0–0.1)
Basophils Relative: 1 %
Eosinophils Absolute: 0.1 10*3/uL (ref 0.0–0.5)
Eosinophils Relative: 5 %
HCT: 33.5 % — ABNORMAL LOW (ref 36.0–46.0)
Hemoglobin: 10.9 g/dL — ABNORMAL LOW (ref 12.0–15.0)
Immature Granulocytes: 0 %
Lymphocytes Relative: 36 %
Lymphs Abs: 0.9 10*3/uL (ref 0.7–4.0)
MCH: 28 pg (ref 26.0–34.0)
MCHC: 32.5 g/dL (ref 30.0–36.0)
MCV: 86.1 fL (ref 80.0–100.0)
Monocytes Absolute: 0.4 10*3/uL (ref 0.1–1.0)
Monocytes Relative: 17 %
Neutro Abs: 1 10*3/uL — ABNORMAL LOW (ref 1.7–7.7)
Neutrophils Relative %: 41 %
Platelets: 280 10*3/uL (ref 150–400)
RBC: 3.89 MIL/uL (ref 3.87–5.11)
RDW: 15.1 % (ref 11.5–15.5)
WBC: 2.4 10*3/uL — ABNORMAL LOW (ref 4.0–10.5)
nRBC: 0 % (ref 0.0–0.2)

## 2018-07-02 LAB — COMPREHENSIVE METABOLIC PANEL
ALT: 30 U/L (ref 0–44)
AST: 23 U/L (ref 15–41)
Albumin: 3.7 g/dL (ref 3.5–5.0)
Alkaline Phosphatase: 70 U/L (ref 38–126)
Anion gap: 6 (ref 5–15)
BUN: 12 mg/dL (ref 6–20)
CO2: 24 mmol/L (ref 22–32)
Calcium: 8.9 mg/dL (ref 8.9–10.3)
Chloride: 108 mmol/L (ref 98–111)
Creatinine, Ser: 0.66 mg/dL (ref 0.44–1.00)
GFR calc non Af Amer: >60 mL/min (ref >60)
Glucose, Bld: 130 mg/dL — ABNORMAL HIGH (ref 70–99)
Potassium: 4.2 mmol/L (ref 3.5–5.1)
Sodium: 138 mmol/L (ref 135–145)
TOTAL PROTEIN: 7 g/dL (ref 6.5–8.1)
Total Bilirubin: 0.3 mg/dL (ref 0.3–1.2)

## 2018-07-02 MED ORDER — HEPARIN SOD (PORK) LOCK FLUSH 100 UNIT/ML IV SOLN: 500.0000 [IU] | Freq: Once | INTRAVENOUS | Status: DC | PRN

## 2018-07-02 MED ORDER — HEPARIN SOD (PORK) LOCK FLUSH 100 UNIT/ML IV SOLN
500.0000 [IU] | Freq: Once | INTRAVENOUS | Status: AC
  Administered 2018-07-02: 500 [IU] via INTRAVENOUS
  Filled 2018-07-02: qty 5

## 2018-07-02 MED ORDER — PALONOSETRON HCL INJECTION 0.25 MG/5ML
0.2500 mg | Freq: Once | INTRAVENOUS | Status: AC
  Administered 2018-07-02: 0.25 mg via INTRAVENOUS
  Filled 2018-07-02: qty 5

## 2018-07-02 MED ORDER — SODIUM CHLORIDE 0.9 % IV SOLN
Freq: Once | INTRAVENOUS | Status: AC
  Administered 2018-07-02: 10:00:00 via INTRAVENOUS
  Filled 2018-07-02: qty 5

## 2018-07-02 MED ORDER — DOXORUBICIN HCL CHEMO IV INJECTION 2 MG/ML
25.0000 mg/m2 | Freq: Once | INTRAVENOUS | Status: AC
  Administered 2018-07-02: 52 mg via INTRAVENOUS
  Filled 2018-07-02: qty 26

## 2018-07-02 MED ORDER — SODIUM CHLORIDE 0.9 % IV SOLN
Freq: Once | INTRAVENOUS | Status: AC
  Administered 2018-07-02: 09:00:00 via INTRAVENOUS
  Filled 2018-07-02: qty 250

## 2018-07-02 MED ORDER — SODIUM CHLORIDE 0.9 % IV SOLN
10.0000 [IU]/m2 | Freq: Once | INTRAVENOUS | Status: AC
  Administered 2018-07-02: 21 [IU] via INTRAVENOUS
  Filled 2018-07-02: qty 7

## 2018-07-02 MED ORDER — SODIUM CHLORIDE 0.9 % IV SOLN
375.0000 mg/m2 | Freq: Once | INTRAVENOUS | Status: AC
  Administered 2018-07-02: 780 mg via INTRAVENOUS
  Filled 2018-07-02: qty 78

## 2018-07-02 MED ORDER — SODIUM CHLORIDE 0.9% FLUSH
10.0000 mL | INTRAVENOUS | Status: DC | PRN
  Administered 2018-07-02: 10 mL via INTRAVENOUS
  Filled 2018-07-02: qty 10

## 2018-07-02 MED ORDER — VINBLASTINE SULFATE CHEMO INJECTION 1 MG/ML
6.0000 mg/m2 | Freq: Once | INTRAVENOUS | Status: AC
  Administered 2018-07-02: 12.5 mg via INTRAVENOUS
  Filled 2018-07-02: qty 12.5

## 2018-07-02 NOTE — Progress Notes (Signed)
Ms. Misty Combs did not want her Zoladex injection today. She mentioned that she would like to take the injection on Wednesday with her Neulasta. Dr. Grayland Ormond is aware.

## 2018-07-02 NOTE — Progress Notes (Signed)
Patient is here today to follow up on her Nodular sclerosis Hodgkin Lymphoma of  intrathoracic lymph nodes. Patient stated that she had been doing well with no complaints.

## 2018-07-04 ENCOUNTER — Inpatient Hospital Stay: Payer: Commercial Managed Care - PPO

## 2018-07-04 DIAGNOSIS — C8112 Nodular sclerosis classical Hodgkin lymphoma, intrathoracic lymph nodes: Secondary | ICD-10-CM

## 2018-07-04 DIAGNOSIS — Z5111 Encounter for antineoplastic chemotherapy: Secondary | ICD-10-CM | POA: Diagnosis not present

## 2018-07-04 MED ORDER — PEGFILGRASTIM INJECTION 6 MG/0.6ML ~~LOC~~
6.0000 mg | PREFILLED_SYRINGE | Freq: Once | SUBCUTANEOUS | Status: AC
  Administered 2018-07-04: 6 mg via SUBCUTANEOUS

## 2018-07-04 MED ORDER — GOSERELIN ACETATE 3.6 MG ~~LOC~~ IMPL
3.6000 mg | DRUG_IMPLANT | Freq: Once | SUBCUTANEOUS | Status: AC
  Administered 2018-07-04: 3.6 mg via SUBCUTANEOUS
  Filled 2018-07-04: qty 3.6

## 2018-07-13 ENCOUNTER — Ambulatory Visit
Admission: RE | Admit: 2018-07-13 | Discharge: 2018-07-13 | Disposition: A | Discharge status: Home / Self Care | Payer: Commercial Managed Care - PPO | Source: Ambulatory Visit | Attending: Oncology | Admitting: Oncology

## 2018-07-13 DIAGNOSIS — C8112 Nodular sclerosis classical Hodgkin lymphoma, intrathoracic lymph nodes: Secondary | ICD-10-CM | POA: Diagnosis not present

## 2018-07-13 LAB — GLUCOSE, CAPILLARY: Glucose-Capillary: 91 mg/dL (ref 70–99)

## 2018-07-13 MED ORDER — FLUDEOXYGLUCOSE F - 18 (FDG) INJECTION
11.9400 | Freq: Once | INTRAVENOUS | Status: AC | PRN
  Administered 2018-07-13: 11.94 via INTRAVENOUS

## 2018-07-15 NOTE — Progress Notes (Signed)
[place]  Telephone:(336) [phone] [phone]) [phone]  ID: Misty Combs OB: 04-07-1986  MR#: [id number]  RSW#:[id number]  Patient Care Team: Gala Lewandowsky, MD as PCP - General (Family Medicine)  CHIEF COMPLAINT: Stage II Hodgkin's lymphoma.  INTERVAL HISTORY: Patient returns to clinic today for further evaluation, discussion of her PET scan results, and consideration of cycle 3, day 1 of ABVD.  She continues to tolerate her treatments well without significant side effects. She continues to be active and work full-time.  She has noted a small boil on her back that is mildly tender and erythematous.  She has no neurologic complaints.  She denies any fevers, chills, night sweats, or weight loss.  She denies any further chest pain or shortness of breath.  She has no cough or hemoptysis.  She denies any nausea, vomiting, constipation, or diarrhea.  She has no urinary complaints.  Patient otherwise feels well and offers no further specific complaints today.  REVIEW OF SYSTEMS:   Review of Systems  Constitutional: Negative.  Negative for fever, malaise/fatigue and weight loss.  Respiratory: Negative.  Negative for cough and shortness of breath.   Cardiovascular: Negative.  Negative for chest pain and leg swelling.  Gastrointestinal: Negative.  Negative for abdominal pain, blood in stool and melena.  Genitourinary: Negative.  Negative for dysuria and flank pain.  Musculoskeletal: Negative.  Negative for back pain.  Skin: Negative.  Negative for itching and rash.  Neurological: Negative.  Negative for focal weakness, weakness and headaches.  Psychiatric/Behavioral: Negative.  The patient is not nervous/anxious.     As per HPI. Otherwise, a complete review of systems is negative.  PAST MEDICAL HISTORY: Past Medical History:  Diagnosis Date  . Asthma    exercise induced  . Migraines    no aura  . UTI (lower urinary tract infection)    post coital     PAST SURGICAL HISTORY: Past Surgical History:  Procedure Laterality Date  . ELECTROMAGNETIC NAVIGATION BROCHOSCOPY Left 05/02/2018   Procedure: ELECTROMAGNETIC NAVIGATION BRONCHOSCOPY;  Surgeon: Flora Lipps, MD;  Location: [facility] ORS;  Service: Cardiopulmonary;  Laterality: Left;  . ESOPHAGOGASTRODUODENOSCOPY    . IR IMAGING GUIDED PORT INSERTION  05/14/2018  . KNEE SURGERY Left 2016    FAMILY HISTORY: Family History  Problem Relation Age of Onset  . Hypertension Mother   . Heart disease Father     ADVANCED DIRECTIVES (Y/N):  N  HEALTH MAINTENANCE: Social History   Tobacco Use  . Smoking status: Never Smoker  . Smokeless tobacco: Never Used  Substance Use Topics  . Alcohol use: Yes    Alcohol/week: 2.0 standard drinks    Types: 2 Glasses of wine per week  . Drug use: No     Colonoscopy:  PAP:  Bone density:  Lipid panel:  Allergies  Allergen Reactions  . Penicillins Anaphylaxis  . Other     allergic to cats  . Sulfur Nausea And Vomiting    Dizzy  . Tetanus Toxoids Swelling    Fever and swelling  . Gluten Meal Rash    Sensitive to gluten    Current Outpatient Medications  Medication Sig Dispense Refill  . albuterol (PROVENTIL HFA;VENTOLIN HFA) 108 (90 BASE) MCG/ACT inhaler Inhale into the lungs every 6 (six) hours as needed for wheezing or shortness of breath.    Marland Kitchen amitriptyline (ELAVIL) 25 MG tablet Take 25 mg by mouth at bedtime.   1  . azelastine (ASTELIN) 0.1 % nasal spray Place 2 sprays  into both nostrils 2 (two) times daily. Use in each nostril as directed     . Bepotastine Besilate (BEPREVE) 1.5 % SOLN Apply 1 drop to eye 2 (two) times daily.     . budesonide (PULMICORT) 0.5 MG/2ML nebulizer solution USE 2 MLS (0.5 MG TOTAL) BY NEBULIZATION 2 (TWO) TIMES DAILY. NASAL RINSE    . dicyclomine (BENTYL) 20 MG tablet Take 20 mg by mouth 4 (four) times daily as needed for spasms.   1  . fluticasone furoate-vilanterol (BREO ELLIPTA) 100-25 MCG/INH AEPB  Inhale 1 puff into the lungs daily.     Marland Kitchen HYDROcodone-acetaminophen (NORCO/VICODIN) 5-325 MG tablet Take 1 tablet by mouth every 6 (six) hours as needed for moderate pain or severe pain. 20 tablet 0  . hydrOXYzine (VISTARIL) 25 MG capsule Take 1 capsule by mouth.    . levocetirizine (XYZAL) 5 MG tablet Take 5 mg by mouth daily.    Marland Kitchen lidocaine-prilocaine (EMLA) cream Apply to affected area once 30 g 3  . ondansetron (ZOFRAN) 8 MG tablet Take 1 tablet (8 mg total) by mouth 2 (two) times daily as needed. 30 tablet 2  . predniSONE (STERAPRED UNI-PAK 21 TAB) 10 MG (21) TBPK tablet Taper as directed 21 tablet 0  . Prenatal Vit-Fe Fumarate-FA (PRENATAL VITAMIN PO) Take 1 tablet by mouth daily.     . prochlorperazine (COMPAZINE) 10 MG tablet Take 1 tablet (10 mg total) by mouth every 6 (six) hours as needed (Nausea or vomiting). 60 tablet 2  . Doxycycline Hyclate 50 MG TABS Take 1 tablet by mouth 2 (two) times daily. 20 tablet 0   No current facility-administered medications for this visit.     OBJECTIVE: Vitals:   07/16/18 0824  BP: 118/83  Pulse: 100  Temp: 98.6 F (37 C)     Body mass index is 35.93 kg/m.    ECOG FS:0 - Asymptomatic  General: Well-developed, well-nourished, no acute distress. Eyes: Pink conjunctiva, anicteric sclera. HEENT: Normocephalic, moist mucous membranes. Lungs: Clear to auscultation bilaterally. Heart: Regular rate and rhythm. No rubs, murmurs, or gallops. Abdomen: Soft, nontender, nondistended. No organomegaly noted, normoactive bowel sounds. Musculoskeletal: No edema, cyanosis, or clubbing. Neuro: Alert, answering all questions appropriately. Cranial nerves grossly intact. Skin: No rashes or petechiae noted.  Small midline lower lumbar boil with mild erythema. Psych: Normal affect.  LAB RESULTS:  Lab Results  Component Value Date   NA 137 07/16/2018   K 3.9 07/16/2018   CL 106 07/16/2018   CO2 24 07/16/2018   GLUCOSE 124 (H) 07/16/2018   BUN 16  07/16/2018   CREATININE 0.69 07/16/2018   CALCIUM 9.0 07/16/2018   PROT 7.4 07/16/2018   ALBUMIN 3.8 07/16/2018   AST 17 07/16/2018   ALT 26 07/16/2018   ALKPHOS 101 07/16/2018   BILITOT 0.3 07/16/2018   GFRNONAA >60 07/16/2018   GFRAA >60 07/16/2018    Lab Results  Component Value Date   WBC 17.7 (H) 07/16/2018   NEUTROABS 13.7 (H) 07/16/2018   HGB 10.6 (L) 07/16/2018   HCT 32.2 (L) 07/16/2018   MCV 86.6 07/16/2018   PLT 144 (L) 07/16/2018     STUDIES: Nm Pet Image Restag (ps) Skull Base To Thigh  Result Date: 07/13/2018 CLINICAL DATA:  Subsequent treatment strategy for lymphoma. EXAM: NUCLEAR MEDICINE PET SKULL BASE TO THIGH TECHNIQUE: 11.9 mCi F-18 FDG was injected intravenously. Full-ring PET imaging was performed from the skull base to thigh after the radiotracer. CT data was obtained and used for  attenuation correction and anatomic localization. Fasting blood glucose: 91 mg/dl COMPARISON:  05/10/2018 FINDINGS: Mediastinal blood pool activity: SUV max 2.94 Liver activity: SUV max 3.46 NECK: No hypermetabolic lymph nodes in the neck. Incidental CT findings: None CHEST: Interval resolution of previous hypermetabolic left axillary, retrosternal, and left supraclavicular lymph nodes. Resolution of previous right supraclavicular right-sided pre-vascular adenopathy, right paratracheal adenopathy. The large left-sided pre-vascular mediastinal mass on today's exam this measures 5.6 by 2.9 cm within SUV max of 3.2 compatible with Deauville criteria 3. Incidental CT findings: Decrease in left pleural effusion. No hypermetabolic pulmonary nodules ABDOMEN/PELVIS: No abnormal uptake within the liver. Pancreas is unremarkable. Normal appearance of the adrenal glands. Diffusely increased radiotracer uptake within the spleen has an SUV max of 4.16. No focal hypermetabolic splenic lesions. No hypermetabolic nodal mass within the abdomen or pelvis. Incidental CT findings: none SKELETON: Scattered FDG  avid subcutaneous soft tissue nodules are noted: -small hypermetabolic subcutaneous nodule along the midline of the ventral chest wall which measures 7 mm and has an SUV max of 4.19. New from previous exam compatible with Deauville criteria 5. -Hypermetabolic right axillary skin nodule measures 1.4 by 0.6 cm and has an SUV max of 4.58, previously 4.02. This is compatible with Deauville criteria 4. -Within the subcutaneous soft tissues superficial to the lower sacrum there is a midline soft tissue nodule measuring 1.8 cm within SUV max of 5.74. New from previous exam compatible with Deauville criteria 5. -Also new from previous exam is a tiny subcutaneous nodule within the soft tissues along the upper back measuring 1 cm with an SUV max of 1.57. - Resolution of previous skin nodule within the posterior neck. Diffusely increased marrow uptake identified throughout the axial and proximal appendicular skeleton is noted. Favor treatment related changes. Incidental CT findings: none IMPRESSION: 1. Mixed response to therapy. 2. Significant interval reduction in size and FDG uptake of anterior mediastinal prevascular nodal mass now compatible with Deauville criteria 3 lesion. There is been resolution of previous supraclavicular, left axillary, right paratracheal and right anterior mediastinal hypermetabolic lymph nodes. 3. Multifocal FDG avid subcutaneous soft tissue nodules are again identified. The lesion in the right axilla continues to exhibit increased uptake compatible with Deauville criteria 4 lesion. New FDG avid lesions are identified within the midline of the ventral chest wall, posterior upper chest, and within the soft tissues posterior to the lower sacrum compatible with Deauville criteria 5 lesions. The hypermetabolic subcutaneous nodule within the subcutaneous soft tissues of the posterior neck has resolved. Electronically Signed   By: Kerby Moors M.D.   On: 07/13/2018 12:51    ASSESSMENT: Stage II  Hodgkin's lymphoma.  PLAN:    1.  Stage II Hodgkin's lymphoma: PET scan results from July 13, 2018 reviewed independently and reported as above with persistent disease.  Mediastinal nodal mass is Deauville 3 whereas axillary lymphadenopathy continues to be Deauville 4.  Because of this patient will proceed with 2 additional cycles or 4 more treatments of ABVD and then will repeat another PET scan.  MUGA scan from May 11, 2018 revealed an EF of 61%.  Repeat in March 2020.  Previously, PFTs are adequate for treatment.  Proceed with cycle 3, day 1 of ABVD today.  Patient appears to only require Neulasta after day 15 of each cycle.  Return to clinic in 2 weeks for further evaluation and consideration of cycle 3, day 15. 2.  Fertility preservation: Continue monthly Zoladex on day 15 of each treatment. 3.  Leukocytosis:  Possibly secondary to Neulasta, monitor. 4.  Anemia: Mild, monitor. 5.  Boil: Patient was given a prescription for doxycycline x5 days.  Patient expressed understanding and was in agreement with this plan. She also understands that She can call clinic at any time with any questions, concerns, or complaints.   Cancer Staging Hodgkin's lymphoma [facility]) Staging form: Hodgkin and Non-Hodgkin Lymphoma, AJCC 8th Edition - Clinical stage from 05/13/2018: Stage II bulky (Hodgkin lymphoma, A - Asymptomatic) - Signed by Lloyd Huger, MD on 05/13/2018   Lloyd Huger, MD   07/17/2018 8:55 AM

## 2018-07-16 ENCOUNTER — Other Ambulatory Visit: Payer: Commercial Managed Care - PPO

## 2018-07-16 ENCOUNTER — Inpatient Hospital Stay: Payer: Commercial Managed Care - PPO

## 2018-07-16 ENCOUNTER — Other Ambulatory Visit: Payer: Self-pay

## 2018-07-16 ENCOUNTER — Inpatient Hospital Stay (HOSPITAL_BASED_OUTPATIENT_CLINIC_OR_DEPARTMENT_OTHER): Payer: Commercial Managed Care - PPO | Admitting: Oncology

## 2018-07-16 VITALS — BP 118/83 | HR 100 | Temp 98.6°F | Ht 65.0 in | Wt 215.9 lb

## 2018-07-16 DIAGNOSIS — D72829 Elevated white blood cell count, unspecified: Secondary | ICD-10-CM | POA: Diagnosis not present

## 2018-07-16 DIAGNOSIS — D649 Anemia, unspecified: Secondary | ICD-10-CM

## 2018-07-16 DIAGNOSIS — C8192 Hodgkin lymphoma, unspecified, intrathoracic lymph nodes: Secondary | ICD-10-CM | POA: Diagnosis not present

## 2018-07-16 DIAGNOSIS — Z5111 Encounter for antineoplastic chemotherapy: Secondary | ICD-10-CM | POA: Diagnosis not present

## 2018-07-16 DIAGNOSIS — C8112 Nodular sclerosis classical Hodgkin lymphoma, intrathoracic lymph nodes: Secondary | ICD-10-CM

## 2018-07-16 DIAGNOSIS — L02222 Furuncle of back [any part, except buttock]: Secondary | ICD-10-CM

## 2018-07-16 LAB — COMPREHENSIVE METABOLIC PANEL
ALT: 26 U/L (ref 0–44)
AST: 17 U/L (ref 15–41)
Albumin: 3.8 g/dL (ref 3.5–5.0)
Alkaline Phosphatase: 101 U/L (ref 38–126)
Anion gap: 7 (ref 5–15)
BUN: 16 mg/dL (ref 6–20)
CO2: 24 mmol/L (ref 22–32)
Calcium: 9 mg/dL (ref 8.9–10.3)
Chloride: 106 mmol/L (ref 98–111)
Creatinine, Ser: 0.69 mg/dL (ref 0.44–1.00)
GFR calc Af Amer: >60 mL/min (ref >60)
GFR calc non Af Amer: >60 mL/min (ref >60)
Glucose, Bld: 124 mg/dL — ABNORMAL HIGH (ref 70–99)
Potassium: 3.9 mmol/L (ref 3.5–5.1)
Sodium: 137 mmol/L (ref 135–145)
Total Bilirubin: 0.3 mg/dL (ref 0.3–1.2)
Total Protein: 7.4 g/dL (ref 6.5–8.1)

## 2018-07-16 LAB — CBC WITH DIFFERENTIAL/PLATELET
Abs Immature Granulocytes: 1.12 10*3/uL — ABNORMAL HIGH (ref 0.00–0.07)
Basophils Absolute: 0.1 10*3/uL (ref 0.0–0.1)
Basophils Relative: 1 %
Eosinophils Absolute: 0.1 10*3/uL (ref 0.0–0.5)
Eosinophils Relative: 1 %
HEMATOCRIT: 32.2 % — AB (ref 36.0–46.0)
Hemoglobin: 10.6 g/dL — ABNORMAL LOW (ref 12.0–15.0)
Immature Granulocytes: 6 %
Lymphocytes Relative: 8 %
Lymphs Abs: 1.5 10*3/uL (ref 0.7–4.0)
MCH: 28.5 pg (ref 26.0–34.0)
MCHC: 32.9 g/dL (ref 30.0–36.0)
MCV: 86.6 fL (ref 80.0–100.0)
MONO ABS: 1.2 10*3/uL — AB (ref 0.1–1.0)
Monocytes Relative: 7 %
Neutro Abs: 13.7 10*3/uL — ABNORMAL HIGH (ref 1.7–7.7)
Neutrophils Relative %: 77 %
Platelets: 144 10*3/uL — ABNORMAL LOW (ref 150–400)
RBC: 3.72 MIL/uL — ABNORMAL LOW (ref 3.87–5.11)
RDW: 16.2 % — ABNORMAL HIGH (ref 11.5–15.5)
WBC: 17.7 10*3/uL — ABNORMAL HIGH (ref 4.0–10.5)
nRBC: 0.1 % (ref 0.0–0.2)

## 2018-07-16 MED ORDER — PALONOSETRON HCL INJECTION 0.25 MG/5ML
0.2500 mg | Freq: Once | INTRAVENOUS | Status: AC
  Administered 2018-07-16: 0.25 mg via INTRAVENOUS

## 2018-07-16 MED ORDER — SODIUM CHLORIDE 0.9 % IV SOLN
375.0000 mg/m2 | Freq: Once | INTRAVENOUS | Status: AC
  Administered 2018-07-16: 780 mg via INTRAVENOUS
  Filled 2018-07-16: qty 78

## 2018-07-16 MED ORDER — SODIUM CHLORIDE 0.9 % IV SOLN
Freq: Once | INTRAVENOUS | Status: AC
  Administered 2018-07-16: 10:00:00 via INTRAVENOUS
  Filled 2018-07-16: qty 250

## 2018-07-16 MED ORDER — SODIUM CHLORIDE 0.9 % IV SOLN
10.0000 [IU]/m2 | Freq: Once | INTRAVENOUS | Status: AC
  Administered 2018-07-16: 21 [IU] via INTRAVENOUS
  Filled 2018-07-16: qty 7

## 2018-07-16 MED ORDER — VINBLASTINE SULFATE CHEMO INJECTION 1 MG/ML
6.0000 mg/m2 | Freq: Once | INTRAVENOUS | Status: AC
  Administered 2018-07-16: 12.5 mg via INTRAVENOUS
  Filled 2018-07-16: qty 12.5

## 2018-07-16 MED ORDER — HEPARIN SOD (PORK) LOCK FLUSH 100 UNIT/ML IV SOLN
500.0000 [IU] | Freq: Once | INTRAVENOUS | Status: AC
  Administered 2018-07-16: 500 [IU] via INTRAVENOUS

## 2018-07-16 MED ORDER — DOXYCYCLINE HYCLATE 50 MG PO TABS: 1.0000 | ORAL_TABLET | Freq: Two times a day (BID) | ORAL | 0 refills | Status: DC

## 2018-07-16 MED ORDER — DOXORUBICIN HCL CHEMO IV INJECTION 2 MG/ML
25.0000 mg/m2 | Freq: Once | INTRAVENOUS | Status: AC
  Administered 2018-07-16: 52 mg via INTRAVENOUS
  Filled 2018-07-16: qty 25

## 2018-07-16 MED ORDER — SODIUM CHLORIDE 0.9 % IV SOLN
Freq: Once | INTRAVENOUS | Status: AC
  Administered 2018-07-16: 10:00:00 via INTRAVENOUS
  Filled 2018-07-16: qty 5

## 2018-07-16 NOTE — Progress Notes (Signed)
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 Patient stated that she has a boil on her lower back for a week and is causing severe pain.

## 2018-07-18 ENCOUNTER — Inpatient Hospital Stay: Payer: Commercial Managed Care - PPO

## 2018-07-18 DIAGNOSIS — Z5111 Encounter for antineoplastic chemotherapy: Secondary | ICD-10-CM | POA: Diagnosis not present

## 2018-07-18 DIAGNOSIS — C8112 Nodular sclerosis classical Hodgkin lymphoma, intrathoracic lymph nodes: Secondary | ICD-10-CM

## 2018-07-18 MED ORDER — PEGFILGRASTIM INJECTION 6 MG/0.6ML ~~LOC~~
6.0000 mg | PREFILLED_SYRINGE | Freq: Once | SUBCUTANEOUS | Status: AC
  Administered 2018-07-18: 6 mg via SUBCUTANEOUS

## 2018-07-29 NOTE — Progress Notes (Signed)
[place]  Telephone:(336) [phone] Fax:(336) [phone]  ID: Misty Combs OB: [birth year]  MR#: [id number]  PJK#:[id number]  Patient Care Team: Gala Lewandowsky, MD as PCP - General (Family Medicine)  CHIEF COMPLAINT: Stage II Hodgkin's lymphoma.  INTERVAL HISTORY: Patient returns to clinic today for further evaluation and consideration of cycle 3, day 15 of ABVD.  She noticed increased nausea after her most recent treatment as well as a mild peripheral neuropathy that has since resolved.  She currently feels well. She continues to be active and work full-time. She has no other neurologic complaints.  She denies any fevers, chills, night sweats, or weight loss.  She denies any chest pain, shortness of breath, cough, or hemoptysis.  She denies any nausea, vomiting, constipation, or diarrhea.  She has no urinary complaints.  Patient offers no further specific complaints today.  REVIEW OF SYSTEMS:   Review of Systems  Constitutional: Negative.  Negative for fever, malaise/fatigue and weight loss.  Respiratory: Negative.  Negative for cough and shortness of breath.   Cardiovascular: Negative.  Negative for chest pain and leg swelling.  Gastrointestinal: Negative.  Negative for abdominal pain, blood in stool and melena.  Genitourinary: Negative.  Negative for dysuria and flank pain.  Musculoskeletal: Negative.  Negative for back pain.  Skin: Negative.  Negative for itching and rash.  Neurological: Negative.  Negative for focal weakness, weakness and headaches.  Psychiatric/Behavioral: Negative.  The patient is not nervous/anxious.     As per HPI. Otherwise, a complete review of systems is negative.  PAST MEDICAL HISTORY: Past Medical History:  Diagnosis Date  . Asthma    exercise induced  . Migraines    no aura  . UTI (lower urinary tract infection)    post coital    PAST SURGICAL HISTORY: Past Surgical History:  Procedure Laterality Date  .  ELECTROMAGNETIC NAVIGATION BROCHOSCOPY Left 05/02/2018   Procedure: ELECTROMAGNETIC NAVIGATION BRONCHOSCOPY;  Surgeon: Flora Lipps, MD;  Location: [facility] ORS;  Service: Cardiopulmonary;  Laterality: Left;  . ESOPHAGOGASTRODUODENOSCOPY    . IR IMAGING GUIDED PORT INSERTION  05/14/2018  . KNEE SURGERY Left 2016    FAMILY HISTORY: Family History  Problem Relation Age of Onset  . Hypertension Mother   . Heart disease Father     ADVANCED DIRECTIVES (Y/N):  N  HEALTH MAINTENANCE: Social History   Tobacco Use  . Smoking status: Never Smoker  . Smokeless tobacco: Never Used  Substance Use Topics  . Alcohol use: Yes    Alcohol/week: 2.0 standard drinks    Types: 2 Glasses of wine per week  . Drug use: No     Colonoscopy:  PAP:  Bone density:  Lipid panel:  Allergies  Allergen Reactions  . Penicillins Anaphylaxis  . Other     allergic to cats  . Sulfur Nausea And Vomiting    Dizzy  . Tetanus Toxoids Swelling    Fever and swelling  . Gluten Meal Rash    Sensitive to gluten    Current Outpatient Medications  Medication Sig Dispense Refill  . albuterol (PROVENTIL HFA;VENTOLIN HFA) 108 (90 BASE) MCG/ACT inhaler Inhale into the lungs every 6 (six) hours as needed for wheezing or shortness of breath.    Marland Kitchen amitriptyline (ELAVIL) 25 MG tablet Take 25 mg by mouth at bedtime.   1  . azelastine (ASTELIN) 0.1 % nasal spray Place 2 sprays into both nostrils 2 (two) times daily. Use in each nostril as directed     .  Bepotastine Besilate (BEPREVE) 1.5 % SOLN Apply 1 drop to eye 2 (two) times daily.     . budesonide (PULMICORT) 0.5 MG/2ML nebulizer solution USE 2 MLS (0.5 MG TOTAL) BY NEBULIZATION 2 (TWO) TIMES DAILY. NASAL RINSE    . dicyclomine (BENTYL) 20 MG tablet Take 20 mg by mouth 4 (four) times daily as needed for spasms.   1  . Doxycycline Hyclate 50 MG TABS Take 1 tablet by mouth 2 (two) times daily. 20 tablet 0  . fluticasone furoate-vilanterol (BREO ELLIPTA) 100-25 MCG/INH  AEPB Inhale 1 puff into the lungs daily.     Marland Kitchen HYDROcodone-acetaminophen (NORCO/VICODIN) 5-325 MG tablet Take 1 tablet by mouth every 6 (six) hours as needed for moderate pain or severe pain. 20 tablet 0  . hydrOXYzine (VISTARIL) 25 MG capsule Take 1 capsule by mouth.    . levocetirizine (XYZAL) 5 MG tablet Take 5 mg by mouth daily.    Marland Kitchen lidocaine-prilocaine (EMLA) cream Apply to affected area once 30 g 3  . Multiple Vitamin (MULTIVITAMIN WITH MINERALS) TABS tablet Take 1 tablet by mouth daily.    . ondansetron (ZOFRAN) 8 MG tablet Take 1 tablet (8 mg total) by mouth 2 (two) times daily as needed. 30 tablet 2  . prochlorperazine (COMPAZINE) 10 MG tablet Take 1 tablet (10 mg total) by mouth every 6 (six) hours as needed (Nausea or vomiting). 60 tablet 2  . predniSONE (STERAPRED UNI-PAK 21 TAB) 10 MG (21) TBPK tablet Taper as directed (Patient not taking: Reported on 07/30/2018) 21 tablet 0  . Prenatal Vit-Fe Fumarate-FA (PRENATAL VITAMIN PO) Take 1 tablet by mouth daily.      No current facility-administered medications for this visit.    Facility-Administered Medications Ordered in Other Visits  Medication Dose Route Frequency Provider Last Rate Last Dose  . dacarbazine (DTIC) 780 mg in sodium chloride 0.9 % 250 mL chemo infusion  375 mg/m2 (Treatment Plan Recorded) Intravenous Once Lloyd Huger, MD 328 mL/hr at 07/30/18 1148 780 mg at 07/30/18 1148  . [COMPLETED] heparin lock flush 100 unit/mL  500 Units Intravenous Once Lloyd Huger, MD   500 Units at 07/30/18 1255    OBJECTIVE: Vitals:   07/30/18 0828  BP: 123/83  Pulse: 85  Temp: 98.1 F (36.7 C)     Body mass index is 36.96 kg/m.    ECOG FS:0 - Asymptomatic  General: Well-developed, well-nourished, no acute distress. Eyes: Pink conjunctiva, anicteric sclera. HEENT: Normocephalic, moist mucous membranes. Lungs: Clear to auscultation bilaterally. Heart: Regular rate and rhythm. No rubs, murmurs, or gallops. Abdomen:  Soft, nontender, nondistended. No organomegaly noted, normoactive bowel sounds. Musculoskeletal: No edema, cyanosis, or clubbing. Neuro: Alert, answering all questions appropriately. Cranial nerves grossly intact. Skin: No rashes or petechiae noted. Psych: Normal affect.  LAB RESULTS:  Lab Results  Component Value Date   NA 138 07/30/2018   K 4.3 07/30/2018   CL 107 07/30/2018   CO2 24 07/30/2018   GLUCOSE 107 (H) 07/30/2018   BUN 19 07/30/2018   CREATININE 0.71 07/30/2018   CALCIUM 8.7 (L) 07/30/2018   PROT 7.2 07/30/2018   ALBUMIN 3.8 07/30/2018   AST 22 07/30/2018   ALT 33 07/30/2018   ALKPHOS 88 07/30/2018   BILITOT 0.4 07/30/2018   GFRNONAA >60 07/30/2018   GFRAA >60 07/30/2018    Lab Results  Component Value Date   WBC 14.9 (H) 07/30/2018   NEUTROABS 10.5 (H) 07/30/2018   HGB 10.0 (L) 07/30/2018   HCT 30.9 (  L) 07/30/2018   MCV 88.8 07/30/2018   PLT 194 07/30/2018     STUDIES: Nm Pet Image Restag (ps) Skull Base To Thigh  Result Date: 07/13/2018 CLINICAL DATA:  Subsequent treatment strategy for lymphoma. EXAM: NUCLEAR MEDICINE PET SKULL BASE TO THIGH TECHNIQUE: 11.9 mCi F-18 FDG was injected intravenously. Full-ring PET imaging was performed from the skull base to thigh after the radiotracer. CT data was obtained and used for attenuation correction and anatomic localization. Fasting blood glucose: 91 mg/dl COMPARISON:  05/10/2018 FINDINGS: Mediastinal blood pool activity: SUV max 2.94 Liver activity: SUV max 3.46 NECK: No hypermetabolic lymph nodes in the neck. Incidental CT findings: None CHEST: Interval resolution of previous hypermetabolic left axillary, retrosternal, and left supraclavicular lymph nodes. Resolution of previous right supraclavicular right-sided pre-vascular adenopathy, right paratracheal adenopathy. The large left-sided pre-vascular mediastinal mass on today's exam this measures 5.6 by 2.9 cm within SUV max of 3.2 compatible with Deauville criteria  3. Incidental CT findings: Decrease in left pleural effusion. No hypermetabolic pulmonary nodules ABDOMEN/PELVIS: No abnormal uptake within the liver. Pancreas is unremarkable. Normal appearance of the adrenal glands. Diffusely increased radiotracer uptake within the spleen has an SUV max of 4.16. No focal hypermetabolic splenic lesions. No hypermetabolic nodal mass within the abdomen or pelvis. Incidental CT findings: none SKELETON: Scattered FDG avid subcutaneous soft tissue nodules are noted: -small hypermetabolic subcutaneous nodule along the midline of the ventral chest wall which measures 7 mm and has an SUV max of 4.19. New from previous exam compatible with Deauville criteria 5. -Hypermetabolic right axillary skin nodule measures 1.4 by 0.6 cm and has an SUV max of 4.58, previously 4.02. This is compatible with Deauville criteria 4. -Within the subcutaneous soft tissues superficial to the lower sacrum there is a midline soft tissue nodule measuring 1.8 cm within SUV max of 5.74. New from previous exam compatible with Deauville criteria 5. -Also new from previous exam is a tiny subcutaneous nodule within the soft tissues along the upper back measuring 1 cm with an SUV max of 1.57. - Resolution of previous skin nodule within the posterior neck. Diffusely increased marrow uptake identified throughout the axial and proximal appendicular skeleton is noted. Favor treatment related changes. Incidental CT findings: none IMPRESSION: 1. Mixed response to therapy. 2. Significant interval reduction in size and FDG uptake of anterior mediastinal prevascular nodal mass now compatible with Deauville criteria 3 lesion. There is been resolution of previous supraclavicular, left axillary, right paratracheal and right anterior mediastinal hypermetabolic lymph nodes. 3. Multifocal FDG avid subcutaneous soft tissue nodules are again identified. The lesion in the right axilla continues to exhibit increased uptake compatible with  Deauville criteria 4 lesion. New FDG avid lesions are identified within the midline of the ventral chest wall, posterior upper chest, and within the soft tissues posterior to the lower sacrum compatible with Deauville criteria 5 lesions. The hypermetabolic subcutaneous nodule within the subcutaneous soft tissues of the posterior neck has resolved. Electronically Signed   By: Kerby Moors M.D.   On: 07/13/2018 12:51    ASSESSMENT: Stage II Hodgkin's lymphoma.  PLAN:    1.  Stage II Hodgkin's lymphoma: PET scan results from July 13, 2018 reviewed independently and reported as above with persistent disease.  Mediastinal nodal mass is Deauville 3 whereas axillary lymphadenopathy continues to be Deauville 4.  Because of this patient will proceed with 2 additional cycles or 4 more treatments of ABVD and then will repeat another PET scan.  MUGA scan from May 11, 2018 revealed an EF of 61%.  Repeat in March 2020.  Previously, PFTs are adequate for treatment.  Proceed with cycle 3, day 15 of ABVD today.  Patient's white count is adequate, therefore she does not require Neulasta after this cycle.  Return to clinic in 2 weeks for further evaluation and consideration of cycle 4, day 1.   2.  Fertility preservation: Continue monthly Zoladex on day 15 of each treatment. 3.  Leukocytosis: Patient does not require Neulasta today.   4.  Anemia: Hemoglobin has trended down slightly to 10.0, monitor. 5.  Boil: Resolved. 6.  Peripheral neuropathy: Mild, monitor.  May need to discontinue bleomycin if necessary. 7.  Nausea: Continue anti-emetics as prescribed.  Patient expressed understanding and was in agreement with this plan. She also understands that She can call clinic at any time with any questions, concerns, or complaints.   Cancer Staging Hodgkin's lymphoma [facility]) Staging form: Hodgkin and Non-Hodgkin Lymphoma, AJCC 8th Edition - Clinical stage from 05/13/2018: Stage II bulky (Hodgkin lymphoma, A -  Asymptomatic) - Signed by Lloyd Huger, MD on 05/13/2018   Lloyd Huger, MD   07/30/2018 12:46 PM

## 2018-07-30 ENCOUNTER — Other Ambulatory Visit: Payer: Self-pay

## 2018-07-30 ENCOUNTER — Inpatient Hospital Stay: Payer: Commercial Managed Care - PPO

## 2018-07-30 ENCOUNTER — Other Ambulatory Visit: Payer: Self-pay | Admitting: Oncology

## 2018-07-30 ENCOUNTER — Inpatient Hospital Stay (HOSPITAL_BASED_OUTPATIENT_CLINIC_OR_DEPARTMENT_OTHER): Payer: Commercial Managed Care - PPO | Admitting: Oncology

## 2018-07-30 ENCOUNTER — Encounter: Payer: Self-pay | Admitting: Oncology

## 2018-07-30 ENCOUNTER — Inpatient Hospital Stay: Payer: Commercial Managed Care - PPO | Attending: Oncology

## 2018-07-30 VITALS — BP 123/83 | HR 85 | Temp 98.1°F | Wt 222.1 lb

## 2018-07-30 DIAGNOSIS — C8192 Hodgkin lymphoma, unspecified, intrathoracic lymph nodes: Secondary | ICD-10-CM | POA: Diagnosis not present

## 2018-07-30 DIAGNOSIS — D709 Neutropenia, unspecified: Secondary | ICD-10-CM | POA: Insufficient documentation

## 2018-07-30 DIAGNOSIS — R110 Nausea: Secondary | ICD-10-CM

## 2018-07-30 DIAGNOSIS — D649 Anemia, unspecified: Secondary | ICD-10-CM | POA: Insufficient documentation

## 2018-07-30 DIAGNOSIS — D72829 Elevated white blood cell count, unspecified: Secondary | ICD-10-CM

## 2018-07-30 DIAGNOSIS — G620 Drug-induced polyneuropathy: Secondary | ICD-10-CM | POA: Diagnosis not present

## 2018-07-30 DIAGNOSIS — C8190 Hodgkin lymphoma, unspecified, unspecified site: Secondary | ICD-10-CM

## 2018-07-30 DIAGNOSIS — Z5111 Encounter for antineoplastic chemotherapy: Secondary | ICD-10-CM | POA: Diagnosis present

## 2018-07-30 DIAGNOSIS — C8198 Hodgkin lymphoma, unspecified, lymph nodes of multiple sites: Secondary | ICD-10-CM | POA: Insufficient documentation

## 2018-07-30 DIAGNOSIS — C8112 Nodular sclerosis classical Hodgkin lymphoma, intrathoracic lymph nodes: Secondary | ICD-10-CM

## 2018-07-30 DIAGNOSIS — G629 Polyneuropathy, unspecified: Secondary | ICD-10-CM | POA: Insufficient documentation

## 2018-07-30 LAB — CBC WITH DIFFERENTIAL/PLATELET
Abs Immature Granulocytes: 1.31 10*3/uL — ABNORMAL HIGH (ref 0.00–0.07)
Basophils Absolute: 0.1 10*3/uL (ref 0.0–0.1)
Basophils Relative: 1 %
EOS ABS: 0.2 10*3/uL (ref 0.0–0.5)
Eosinophils Relative: 2 %
HCT: 30.9 % — ABNORMAL LOW (ref 36.0–46.0)
Hemoglobin: 10 g/dL — ABNORMAL LOW (ref 12.0–15.0)
Immature Granulocytes: 9 %
Lymphocytes Relative: 11 %
Lymphs Abs: 1.7 10*3/uL (ref 0.7–4.0)
MCH: 28.7 pg (ref 26.0–34.0)
MCHC: 32.4 g/dL (ref 30.0–36.0)
MCV: 88.8 fL (ref 80.0–100.0)
Monocytes Absolute: 1 10*3/uL (ref 0.1–1.0)
Monocytes Relative: 7 %
NRBC: 0.2 % (ref 0.0–0.2)
Neutro Abs: 10.5 10*3/uL — ABNORMAL HIGH (ref 1.7–7.7)
Neutrophils Relative %: 70 %
Platelets: 194 10*3/uL (ref 150–400)
RBC: 3.48 MIL/uL — ABNORMAL LOW (ref 3.87–5.11)
RDW: 18.6 % — ABNORMAL HIGH (ref 11.5–15.5)
WBC: 14.9 10*3/uL — ABNORMAL HIGH (ref 4.0–10.5)

## 2018-07-30 LAB — COMPREHENSIVE METABOLIC PANEL
ALK PHOS: 88 U/L (ref 38–126)
ALT: 33 U/L (ref 0–44)
ANION GAP: 7 (ref 5–15)
AST: 22 U/L (ref 15–41)
Albumin: 3.8 g/dL (ref 3.5–5.0)
BUN: 19 mg/dL (ref 6–20)
CALCIUM: 8.7 mg/dL — AB (ref 8.9–10.3)
CO2: 24 mmol/L (ref 22–32)
Chloride: 107 mmol/L (ref 98–111)
Creatinine, Ser: 0.71 mg/dL (ref 0.44–1.00)
GFR calc Af Amer: >60 mL/min (ref >60)
GFR calc non Af Amer: >60 mL/min (ref >60)
Glucose, Bld: 107 mg/dL — ABNORMAL HIGH (ref 70–99)
Potassium: 4.3 mmol/L (ref 3.5–5.1)
Sodium: 138 mmol/L (ref 135–145)
Total Bilirubin: 0.4 mg/dL (ref 0.3–1.2)
Total Protein: 7.2 g/dL (ref 6.5–8.1)

## 2018-07-30 MED ORDER — VINBLASTINE SULFATE CHEMO INJECTION 1 MG/ML
6.0000 mg/m2 | Freq: Once | INTRAVENOUS | Status: AC
  Administered 2018-07-30: 12.5 mg via INTRAVENOUS
  Filled 2018-07-30: qty 12.5

## 2018-07-30 MED ORDER — SODIUM CHLORIDE 0.9 % IV SOLN
10.0000 [IU]/m2 | Freq: Once | INTRAVENOUS | Status: AC
  Administered 2018-07-30: 21 [IU] via INTRAVENOUS
  Filled 2018-07-30: qty 7

## 2018-07-30 MED ORDER — SODIUM CHLORIDE 0.9 % IV SOLN
Freq: Once | INTRAVENOUS | Status: AC
  Administered 2018-07-30: 10:00:00 via INTRAVENOUS
  Filled 2018-07-30: qty 250

## 2018-07-30 MED ORDER — PALONOSETRON HCL INJECTION 0.25 MG/5ML
0.2500 mg | Freq: Once | INTRAVENOUS | Status: AC
  Administered 2018-07-30: 0.25 mg via INTRAVENOUS
  Filled 2018-07-30: qty 5

## 2018-07-30 MED ORDER — DOXORUBICIN HCL CHEMO IV INJECTION 2 MG/ML
25.0000 mg/m2 | Freq: Once | INTRAVENOUS | Status: AC
  Administered 2018-07-30: 52 mg via INTRAVENOUS
  Filled 2018-07-30: qty 25

## 2018-07-30 MED ORDER — SODIUM CHLORIDE 0.9 % IV SOLN
375.0000 mg/m2 | Freq: Once | INTRAVENOUS | Status: AC
  Administered 2018-07-30: 780 mg via INTRAVENOUS
  Filled 2018-07-30: qty 78

## 2018-07-30 MED ORDER — SODIUM CHLORIDE 0.9 % IV SOLN
Freq: Once | INTRAVENOUS | Status: AC
  Administered 2018-07-30: 10:00:00 via INTRAVENOUS
  Filled 2018-07-30: qty 5

## 2018-07-30 MED ORDER — HEPARIN SOD (PORK) LOCK FLUSH 100 UNIT/ML IV SOLN
500.0000 [IU] | Freq: Once | INTRAVENOUS | Status: AC
  Administered 2018-07-30: 500 [IU] via INTRAVENOUS
  Filled 2018-07-30: qty 5

## 2018-08-01 ENCOUNTER — Inpatient Hospital Stay: Payer: Commercial Managed Care - PPO

## 2018-08-09 ENCOUNTER — Other Ambulatory Visit: Payer: Self-pay

## 2018-08-09 ENCOUNTER — Ambulatory Visit
Admission: RE | Admit: 2018-08-09 | Discharge: 2018-08-09 | Disposition: A | Discharge status: Home / Self Care | Payer: Commercial Managed Care - PPO | Source: Ambulatory Visit | Attending: Oncology | Admitting: Oncology

## 2018-08-09 DIAGNOSIS — C8190 Hodgkin lymphoma, unspecified, unspecified site: Secondary | ICD-10-CM | POA: Diagnosis not present

## 2018-08-09 MED ORDER — TECHNETIUM TC 99M-LABELED RED BLOOD CELLS IV KIT
20.0000 | PACK | Freq: Once | INTRAVENOUS | Status: AC | PRN
  Administered 2018-08-09: 21.318 via INTRAVENOUS

## 2018-08-12 ENCOUNTER — Other Ambulatory Visit: Payer: Self-pay

## 2018-08-12 NOTE — Progress Notes (Signed)
[place]  [phone]) [phone] Fax:(336) [phone]  ID: [name] OB: Aug 10, 1985  MR#: [id number]  YQM#:[id number]  Patient Care Team: Gala Lewandowsky, MD as PCP - General (Family Medicine)  CHIEF COMPLAINT: Stage II Hodgkin's lymphoma.  INTERVAL HISTORY: Patient returns to clinic today for further evaluation and consideration of cycle 4, day 1 of ABVD.  She currently feels well and is asymptomatic. She has no neurologic complaints.  She denies any fevers, chills, night sweats, or weight loss.  She denies any chest pain, shortness of breath, cough, or hemoptysis.  She denies any nausea, vomiting, constipation, or diarrhea.  She has no urinary complaints.  Patient feels at her baseline offers no specific complaints today.  REVIEW OF SYSTEMS:   Review of Systems  Constitutional: Negative.  Negative for fever, malaise/fatigue and weight loss.  Respiratory: Negative.  Negative for cough and shortness of breath.   Cardiovascular: Negative.  Negative for chest pain and leg swelling.  Gastrointestinal: Negative.  Negative for abdominal pain, blood in stool and melena.  Genitourinary: Negative.  Negative for dysuria and flank pain.  Musculoskeletal: Negative.  Negative for back pain.  Skin: Negative.  Negative for itching and rash.  Neurological: Negative.  Negative for focal weakness, weakness and headaches.  Psychiatric/Behavioral: Negative.  The patient is not nervous/anxious.     As per HPI. Otherwise, a complete review of systems is negative.  PAST MEDICAL HISTORY: Past Medical History:  Diagnosis Date  . Asthma    exercise induced  . Migraines    no aura  . UTI (lower urinary tract infection)    post coital    PAST SURGICAL HISTORY: Past Surgical History:  Procedure Laterality Date  . ELECTROMAGNETIC NAVIGATION BROCHOSCOPY Left 05/02/2018   Procedure: ELECTROMAGNETIC NAVIGATION BRONCHOSCOPY;  Surgeon: Flora Lipps, MD;  Location: [facility]  ORS;  Service: Cardiopulmonary;  Laterality: Left;  . ESOPHAGOGASTRODUODENOSCOPY    . IR IMAGING GUIDED PORT INSERTION  05/14/2018  . KNEE SURGERY Left 2016    FAMILY HISTORY: Family History  Problem Relation Age of Onset  . Hypertension Mother   . Heart disease Father     ADVANCED DIRECTIVES (Y/N):  N  HEALTH MAINTENANCE: Social History   Tobacco Use  . Smoking status: Never Smoker  . Smokeless tobacco: Never Used  Substance Use Topics  . Alcohol use: Yes    Alcohol/week: 2.0 standard drinks    Types: 2 Glasses of wine per week  . Drug use: No     Colonoscopy:  PAP:  Bone density:  Lipid panel:  Allergies  Allergen Reactions  . Penicillins Anaphylaxis  . Other     allergic to cats  . Sulfur Nausea And Vomiting    Dizzy  . Tetanus Toxoids Swelling    Fever and swelling  . Gluten Meal Rash    Sensitive to gluten    Current Outpatient Medications  Medication Sig Dispense Refill  . albuterol (PROVENTIL HFA;VENTOLIN HFA) 108 (90 BASE) MCG/ACT inhaler Inhale into the lungs every 6 (six) hours as needed for wheezing or shortness of breath.    Marland Kitchen amitriptyline (ELAVIL) 25 MG tablet Take 25 mg by mouth at bedtime.   1  . azelastine (ASTELIN) 0.1 % nasal spray Place 2 sprays into both nostrils 2 (two) times daily. Use in each nostril as directed     . Bepotastine Besilate (BEPREVE) 1.5 % SOLN Apply 1 drop to eye 2 (two) times daily.     . budesonide (PULMICORT) 0.5 MG/2ML  nebulizer solution USE 2 MLS (0.5 MG TOTAL) BY NEBULIZATION 2 (TWO) TIMES DAILY. NASAL RINSE    . dicyclomine (BENTYL) 20 MG tablet Take 20 mg by mouth 4 (four) times daily as needed for spasms.   1  . fluticasone furoate-vilanterol (BREO ELLIPTA) 100-25 MCG/INH AEPB Inhale 1 puff into the lungs daily.     Marland Kitchen HYDROcodone-acetaminophen (NORCO/VICODIN) 5-325 MG tablet Take 1 tablet by mouth every 6 (six) hours as needed for moderate pain or severe pain. 20 tablet 0  . hydrOXYzine (VISTARIL) 25 MG capsule  Take 1 capsule by mouth.    . levocetirizine (XYZAL) 5 MG tablet Take 5 mg by mouth daily.    Marland Kitchen lidocaine-prilocaine (EMLA) cream Apply to affected area once 30 g 3  . Multiple Vitamin (MULTIVITAMIN WITH MINERALS) TABS tablet Take 1 tablet by mouth daily.    . ondansetron (ZOFRAN) 8 MG tablet Take 1 tablet (8 mg total) by mouth 2 (two) times daily as needed. 30 tablet 2  . prochlorperazine (COMPAZINE) 10 MG tablet Take 1 tablet (10 mg total) by mouth every 6 (six) hours as needed (Nausea or vomiting). 60 tablet 2   No current facility-administered medications for this visit.     OBJECTIVE: Vitals:   08/13/18 0912  BP: 126/84  Pulse: 88  Resp: 18  Temp: (!) 97.5 F (36.4 C)     Body mass index is 36.78 kg/m.    ECOG FS:0 - Asymptomatic  General: Well-developed, well-nourished, no acute distress. Eyes: Pink conjunctiva, anicteric sclera. HEENT: Normocephalic, moist mucous membranes. Lungs: Clear to auscultation bilaterally. Heart: Regular rate and rhythm. No rubs, murmurs, or gallops. Abdomen: Soft, nontender, nondistended. No organomegaly noted, normoactive bowel sounds. Musculoskeletal: No edema, cyanosis, or clubbing. Neuro: Alert, answering all questions appropriately. Cranial nerves grossly intact. Skin: No rashes or petechiae noted. Psych: Normal affect.   LAB RESULTS:  Lab Results  Component Value Date   NA 138 08/13/2018   K 4.0 08/13/2018   CL 107 08/13/2018   CO2 23 08/13/2018   GLUCOSE 109 (H) 08/13/2018   BUN 15 08/13/2018   CREATININE 0.88 08/13/2018   CALCIUM 8.7 (L) 08/13/2018   PROT 6.8 08/13/2018   ALBUMIN 3.6 08/13/2018   AST 23 08/13/2018   ALT 38 08/13/2018   ALKPHOS 78 08/13/2018   BILITOT 0.5 08/13/2018   GFRNONAA >60 08/13/2018   GFRAA >60 08/13/2018    Lab Results  Component Value Date   WBC 2.2 (L) 08/13/2018   NEUTROABS 1.0 (L) 08/13/2018   HGB 9.5 (L) 08/13/2018   HCT 29.7 (L) 08/13/2018   MCV 88.9 08/13/2018   PLT 242 08/13/2018      STUDIES: Nm Cardiac Muga Rest  Result Date: 08/09/2018 CLINICAL DATA:  Hodgkin's lymphoma, cardiotoxic chemotherapy EXAM: NUCLEAR MEDICINE CARDIAC BLOOD POOL IMAGING (MUGA) TECHNIQUE: Cardiac multi-gated acquisition was performed at rest following intravenous injection of Tc-55m labeled red blood cells. RADIOPHARMACEUTICALS:  21.318 mCi Tc-56m pertechnetate in-vitro labeled red blood cells IV COMPARISON:  05/11/2018 FINDINGS: Calculated LEFT ventricular ejection fraction is 61%, normal, unchanged from the previous study. Study was obtained at a cardiac rate of 94 bpm. Patient was rhythmic during imaging. Cine analysis of the LEFT ventricle in 3 projections demonstrates normal LEFT ventricular wall motion. IMPRESSION: Normal LEFT ventricular ejection fraction of 61%, unchanged. Normal LV wall motion. Electronically Signed   By: Lavonia Dana M.D.   On: 08/09/2018 16:22    ASSESSMENT: Stage II Hodgkin's lymphoma.  PLAN:    1.  Stage II Hodgkin's lymphoma: PET scan results from July 13, 2018 reviewed independently with persistent disease.  Mediastinal nodal mass is Deauville 3 whereas axillary lymphadenopathy continues to be Deauville 4.  Because of this patient will proceed with 2 additional cycles or 4 more treatments of ABVD and then will repeat another PET scan.  MUGA scan on August 09, 2018 revealed an EF of 61% which is unchanged. Previously, PFTs are adequate for treatment.  Proceed with cycle 4, day 1 of ABVD today.  Patient will also receive On-Pro Neulasta today.  Return to clinic in 2 weeks for further evaluation and consideration of cycle 4, day 15.  Will reimage with PET scan prior to cycle 5, day 1. 2.  Fertility preservation: Continue monthly Zoladex on day 15 of each treatment. 3.  Neutropenia: Proceed with On-Pro Neulasta as above. 4.  Anemia: Hemoglobin continues to slowly trend down to 9.5, monitor. 5.  Boil: Resolved. 6.  Peripheral neuropathy: Mild, monitor.  May need to  discontinue bleomycin if necessary. 7.  Nausea: Patient does not complain of this today.  Continue anti-emetics as prescribed.  Patient expressed understanding and was in agreement with this plan. She also understands that She can call clinic at any time with any questions, concerns, or complaints.   Cancer Staging Hodgkin's lymphoma [facility]) Staging form: Hodgkin and Non-Hodgkin Lymphoma, AJCC 8th Edition - Clinical stage from 05/13/2018: Stage II bulky (Hodgkin lymphoma, A - Asymptomatic) - Signed by Lloyd Huger, MD on 05/13/2018   Lloyd Huger, MD   08/14/2018 6:20 AM

## 2018-08-13 ENCOUNTER — Encounter: Payer: Self-pay | Admitting: Oncology

## 2018-08-13 ENCOUNTER — Inpatient Hospital Stay (HOSPITAL_BASED_OUTPATIENT_CLINIC_OR_DEPARTMENT_OTHER): Payer: Commercial Managed Care - PPO | Admitting: Oncology

## 2018-08-13 ENCOUNTER — Inpatient Hospital Stay: Payer: Commercial Managed Care - PPO

## 2018-08-13 ENCOUNTER — Other Ambulatory Visit: Payer: Self-pay

## 2018-08-13 VITALS — BP 126/84 | HR 88 | Temp 97.5°F | Resp 18 | Wt 221.0 lb

## 2018-08-13 DIAGNOSIS — C8198 Hodgkin lymphoma, unspecified, lymph nodes of multiple sites: Secondary | ICD-10-CM | POA: Diagnosis not present

## 2018-08-13 DIAGNOSIS — D709 Neutropenia, unspecified: Secondary | ICD-10-CM

## 2018-08-13 DIAGNOSIS — C8112 Nodular sclerosis classical Hodgkin lymphoma, intrathoracic lymph nodes: Secondary | ICD-10-CM

## 2018-08-13 DIAGNOSIS — D649 Anemia, unspecified: Secondary | ICD-10-CM | POA: Diagnosis not present

## 2018-08-13 DIAGNOSIS — G629 Polyneuropathy, unspecified: Secondary | ICD-10-CM | POA: Diagnosis not present

## 2018-08-13 DIAGNOSIS — Z5111 Encounter for antineoplastic chemotherapy: Secondary | ICD-10-CM | POA: Diagnosis not present

## 2018-08-13 DIAGNOSIS — Z95828 Presence of other vascular implants and grafts: Secondary | ICD-10-CM

## 2018-08-13 LAB — COMPREHENSIVE METABOLIC PANEL
ALBUMIN: 3.6 g/dL (ref 3.5–5.0)
ALT: 38 U/L (ref 0–44)
AST: 23 U/L (ref 15–41)
Alkaline Phosphatase: 78 U/L (ref 38–126)
Anion gap: 8 (ref 5–15)
BUN: 15 mg/dL (ref 6–20)
CO2: 23 mmol/L (ref 22–32)
Calcium: 8.7 mg/dL — ABNORMAL LOW (ref 8.9–10.3)
Chloride: 107 mmol/L (ref 98–111)
Creatinine, Ser: 0.88 mg/dL (ref 0.44–1.00)
GFR calc Af Amer: >60 mL/min (ref >60)
GFR calc non Af Amer: >60 mL/min (ref >60)
GLUCOSE: 109 mg/dL — AB (ref 70–99)
POTASSIUM: 4 mmol/L (ref 3.5–5.1)
Sodium: 138 mmol/L (ref 135–145)
TOTAL PROTEIN: 6.8 g/dL (ref 6.5–8.1)
Total Bilirubin: 0.5 mg/dL (ref 0.3–1.2)

## 2018-08-13 LAB — CBC WITH DIFFERENTIAL/PLATELET
Abs Immature Granulocytes: 0.01 10*3/uL (ref 0.00–0.07)
Basophils Absolute: 0 10*3/uL (ref 0.0–0.1)
Basophils Relative: 1 %
Eosinophils Absolute: 0.1 10*3/uL (ref 0.0–0.5)
Eosinophils Relative: 5 %
HCT: 29.7 % — ABNORMAL LOW (ref 36.0–46.0)
HEMOGLOBIN: 9.5 g/dL — AB (ref 12.0–15.0)
Immature Granulocytes: 1 %
Lymphocytes Relative: 33 %
Lymphs Abs: 0.7 10*3/uL (ref 0.7–4.0)
MCH: 28.4 pg (ref 26.0–34.0)
MCHC: 32 g/dL (ref 30.0–36.0)
MCV: 88.9 fL (ref 80.0–100.0)
MONO ABS: 0.4 10*3/uL (ref 0.1–1.0)
Monocytes Relative: 18 %
NEUTROS ABS: 1 10*3/uL — AB (ref 1.7–7.7)
Neutrophils Relative %: 42 %
Platelets: 242 10*3/uL (ref 150–400)
RBC: 3.34 MIL/uL — AB (ref 3.87–5.11)
RDW: 18.5 % — ABNORMAL HIGH (ref 11.5–15.5)
WBC: 2.2 10*3/uL — ABNORMAL LOW (ref 4.0–10.5)
nRBC: 0 % (ref 0.0–0.2)

## 2018-08-13 MED ORDER — SODIUM CHLORIDE 0.9 % IV SOLN
10.0000 [IU]/m2 | Freq: Once | INTRAVENOUS | Status: AC
  Administered 2018-08-13: 21 [IU] via INTRAVENOUS
  Filled 2018-08-13: qty 7

## 2018-08-13 MED ORDER — PALONOSETRON HCL INJECTION 0.25 MG/5ML
0.2500 mg | Freq: Once | INTRAVENOUS | Status: AC
  Administered 2018-08-13: 0.25 mg via INTRAVENOUS
  Filled 2018-08-13: qty 5

## 2018-08-13 MED ORDER — SODIUM CHLORIDE 0.9% FLUSH
10.0000 mL | Freq: Once | INTRAVENOUS | Status: AC
  Administered 2018-08-13: 10 mL via INTRAVENOUS
  Filled 2018-08-13: qty 10

## 2018-08-13 MED ORDER — SODIUM CHLORIDE 0.9 % IV SOLN
Freq: Once | INTRAVENOUS | Status: AC
  Administered 2018-08-13: 10:00:00 via INTRAVENOUS
  Filled 2018-08-13: qty 250

## 2018-08-13 MED ORDER — PEGFILGRASTIM 6 MG/0.6ML ~~LOC~~ PSKT
6.0000 mg | PREFILLED_SYRINGE | Freq: Once | SUBCUTANEOUS | Status: AC
  Administered 2018-08-13: 6 mg via SUBCUTANEOUS
  Filled 2018-08-13: qty 0.6

## 2018-08-13 MED ORDER — DOXORUBICIN HCL CHEMO IV INJECTION 2 MG/ML
25.0000 mg/m2 | Freq: Once | INTRAVENOUS | Status: AC
  Administered 2018-08-13: 52 mg via INTRAVENOUS
  Filled 2018-08-13: qty 10

## 2018-08-13 MED ORDER — HEPARIN SOD (PORK) LOCK FLUSH 100 UNIT/ML IV SOLN
500.0000 [IU] | Freq: Once | INTRAVENOUS | Status: AC | PRN
  Administered 2018-08-13: 500 [IU]
  Filled 2018-08-13 (×2): qty 5

## 2018-08-13 MED ORDER — VINBLASTINE SULFATE CHEMO INJECTION 1 MG/ML
6.0000 mg/m2 | Freq: Once | INTRAVENOUS | Status: AC
  Administered 2018-08-13: 12.5 mg via INTRAVENOUS
  Filled 2018-08-13: qty 12.5

## 2018-08-13 MED ORDER — SODIUM CHLORIDE 0.9 % IV SOLN
375.0000 mg/m2 | Freq: Once | INTRAVENOUS | Status: AC
  Administered 2018-08-13: 780 mg via INTRAVENOUS
  Filled 2018-08-13: qty 78

## 2018-08-13 MED ORDER — SODIUM CHLORIDE 0.9 % IV SOLN
Freq: Once | INTRAVENOUS | Status: AC
  Administered 2018-08-13: 11:00:00 via INTRAVENOUS
  Filled 2018-08-13: qty 5

## 2018-08-13 NOTE — Progress Notes (Signed)
Patient here today for follow up regarding lymphoma. Patient reports acid reflux and neuropathy to feet.

## 2018-08-17 ENCOUNTER — Other Ambulatory Visit: Payer: Commercial Managed Care - PPO

## 2018-08-20 ENCOUNTER — Other Ambulatory Visit: Payer: Commercial Managed Care - PPO

## 2018-08-20 ENCOUNTER — Ambulatory Visit: Payer: Commercial Managed Care - PPO | Admitting: Oncology

## 2018-08-20 ENCOUNTER — Ambulatory Visit: Payer: Commercial Managed Care - PPO

## 2018-08-24 ENCOUNTER — Other Ambulatory Visit: Payer: Self-pay

## 2018-08-26 NOTE — Progress Notes (Signed)
[place]  Telephone:(336) [phone] Fax:(336) [phone]  ID: [name] OB: [birth year]  MR#: [id number]  AOZ#:[id number]  Patient Care Team: Gala Lewandowsky, MD as PCP - General (Family Medicine)  CHIEF COMPLAINT: Stage II Hodgkin's lymphoma.  INTERVAL HISTORY: Patient returns to clinic today for further evaluation and consideration of cycle 4, day 15 of ABVD.  She is tolerating her treatments well without significant side effects.  She currently feels well and is asymptomatic. She has no neurologic complaints.  She denies any fevers, chills, night sweats, or weight loss.  She denies any chest pain, shortness of breath, cough, or hemoptysis.  She denies any nausea, vomiting, constipation, or diarrhea.  She has no urinary complaints.  Patient feels at her baseline offers no specific complaints today.  REVIEW OF SYSTEMS:   Review of Systems  Constitutional: Negative.  Negative for fever, malaise/fatigue and weight loss.  Respiratory: Negative.  Negative for cough and shortness of breath.   Cardiovascular: Negative.  Negative for chest pain and leg swelling.  Gastrointestinal: Negative.  Negative for abdominal pain, blood in stool and melena.  Genitourinary: Negative.  Negative for dysuria and flank pain.  Musculoskeletal: Negative.  Negative for back pain.  Skin: Negative.  Negative for itching and rash.  Neurological: Negative.  Negative for focal weakness, weakness and headaches.  Psychiatric/Behavioral: Negative.  The patient is not nervous/anxious.     As per HPI. Otherwise, a complete review of systems is negative.  PAST MEDICAL HISTORY: Past Medical History:  Diagnosis Date  . Asthma    exercise induced  . Migraines    no aura  . UTI (lower urinary tract infection)    post coital    PAST SURGICAL HISTORY: Past Surgical History:  Procedure Laterality Date  . ELECTROMAGNETIC NAVIGATION BROCHOSCOPY Left 05/02/2018   Procedure:  ELECTROMAGNETIC NAVIGATION BRONCHOSCOPY;  Surgeon: Flora Lipps, MD;  Location: [facility] ORS;  Service: Cardiopulmonary;  Laterality: Left;  . ESOPHAGOGASTRODUODENOSCOPY    . IR IMAGING GUIDED PORT INSERTION  05/14/2018  . KNEE SURGERY Left 2016    FAMILY HISTORY: Family History  Problem Relation Age of Onset  . Hypertension Mother   . Heart disease Father     ADVANCED DIRECTIVES (Y/N):  N  HEALTH MAINTENANCE: Social History   Tobacco Use  . Smoking status: Never Smoker  . Smokeless tobacco: Never Used  Substance Use Topics  . Alcohol use: Yes    Alcohol/week: 2.0 standard drinks    Types: 2 Glasses of wine per week  . Drug use: No     Colonoscopy:  PAP:  Bone density:  Lipid panel:  Allergies  Allergen Reactions  . Penicillins Anaphylaxis  . Other     allergic to cats  . Sulfur Nausea And Vomiting    Dizzy  . Tetanus Toxoids Swelling    Fever and swelling  . Gluten Meal Rash    Sensitive to gluten    Current Outpatient Medications  Medication Sig Dispense Refill  . albuterol (PROVENTIL HFA;VENTOLIN HFA) 108 (90 BASE) MCG/ACT inhaler Inhale into the lungs every 6 (six) hours as needed for wheezing or shortness of breath.    Marland Kitchen amitriptyline (ELAVIL) 25 MG tablet Take 25 mg by mouth at bedtime.   1  . azelastine (ASTELIN) 0.1 % nasal spray Place 2 sprays into both nostrils 2 (two) times daily. Use in each nostril as directed     . Bepotastine Besilate (BEPREVE) 1.5 % SOLN Apply 1 drop to eye 2 (two)  times daily.     . budesonide (PULMICORT) 0.5 MG/2ML nebulizer solution USE 2 MLS (0.5 MG TOTAL) BY NEBULIZATION 2 (TWO) TIMES DAILY. NASAL RINSE    . dicyclomine (BENTYL) 20 MG tablet Take 20 mg by mouth 4 (four) times daily as needed for spasms.   1  . fluticasone furoate-vilanterol (BREO ELLIPTA) 100-25 MCG/INH AEPB Inhale 1 puff into the lungs daily.     Marland Kitchen HYDROcodone-acetaminophen (NORCO/VICODIN) 5-325 MG tablet Take 1 tablet by mouth every 6 (six) hours as needed for  moderate pain or severe pain. 20 tablet 0  . hydrOXYzine (VISTARIL) 25 MG capsule Take 1 capsule by mouth.    . levocetirizine (XYZAL) 5 MG tablet Take 5 mg by mouth daily.    Marland Kitchen lidocaine-prilocaine (EMLA) cream Apply to affected area once 30 g 3  . Multiple Vitamin (MULTIVITAMIN WITH MINERALS) TABS tablet Take 1 tablet by mouth daily.    . ondansetron (ZOFRAN) 8 MG tablet Take 1 tablet (8 mg total) by mouth 2 (two) times daily as needed. 30 tablet 2  . prochlorperazine (COMPAZINE) 10 MG tablet Take 1 tablet (10 mg total) by mouth every 6 (six) hours as needed (Nausea or vomiting). 60 tablet 2   No current facility-administered medications for this visit.     OBJECTIVE: Vitals:   08/27/18 0846  BP: 135/86  Pulse: (!) 111  Temp: 98.4 F (36.9 C)     Body mass index is 36.28 kg/m.    ECOG FS:0 - Asymptomatic  General: Well-developed, well-nourished, no acute distress. Eyes: Pink conjunctiva, anicteric sclera. HEENT: Normocephalic, moist mucous membranes. Lungs: Clear to auscultation bilaterally. Heart: Regular rate and rhythm. No rubs, murmurs, or gallops. Abdomen: Soft, nontender, nondistended. No organomegaly noted, normoactive bowel sounds. Musculoskeletal: No edema, cyanosis, or clubbing. Neuro: Alert, answering all questions appropriately. Cranial nerves grossly intact. Skin: No rashes or petechiae noted. Psych: Normal affect.   LAB RESULTS:  Lab Results  Component Value Date   NA 137 08/27/2018   K 3.8 08/27/2018   CL 106 08/27/2018   CO2 22 08/27/2018   GLUCOSE 117 (H) 08/27/2018   BUN 16 08/27/2018   CREATININE 0.69 08/27/2018   CALCIUM 8.9 08/27/2018   PROT 7.2 08/27/2018   ALBUMIN 3.9 08/27/2018   AST 26 08/27/2018   ALT 33 08/27/2018   ALKPHOS 91 08/27/2018   BILITOT 0.4 08/27/2018   GFRNONAA >60 08/27/2018   GFRAA >60 08/27/2018    Lab Results  Component Value Date   WBC 12.6 (H) 08/27/2018   NEUTROABS 9.4 (H) 08/27/2018   HGB 10.0 (L) 08/27/2018    HCT 31.5 (L) 08/27/2018   MCV 91.0 08/27/2018   PLT 170 08/27/2018     STUDIES: Nm Cardiac Muga Rest  Result Date: 08/09/2018 CLINICAL DATA:  Hodgkin's lymphoma, cardiotoxic chemotherapy EXAM: NUCLEAR MEDICINE CARDIAC BLOOD POOL IMAGING (MUGA) TECHNIQUE: Cardiac multi-gated acquisition was performed at rest following intravenous injection of Tc-38m labeled red blood cells. RADIOPHARMACEUTICALS:  21.318 mCi Tc-46m pertechnetate in-vitro labeled red blood cells IV COMPARISON:  05/11/2018 FINDINGS: Calculated LEFT ventricular ejection fraction is 61%, normal, unchanged from the previous study. Study was obtained at a cardiac rate of 94 bpm. Patient was rhythmic during imaging. Cine analysis of the LEFT ventricle in 3 projections demonstrates normal LEFT ventricular wall motion. IMPRESSION: Normal LEFT ventricular ejection fraction of 61%, unchanged. Normal LV wall motion. Electronically Signed   By: Lavonia Dana M.D.   On: 08/09/2018 16:22    ASSESSMENT: Stage II Hodgkin's lymphoma.  PLAN:    1.  Stage II Hodgkin's lymphoma: PET scan results from July 13, 2018 reviewed independently with persistent disease.  Mediastinal nodal mass is Deauville 3 whereas axillary lymphadenopathy continues to be Deauville 4.  Because of this patient will proceed with 2 additional cycles or 4 more treatments of ABVD and then will repeat another PET scan.  MUGA scan on August 09, 2018 revealed an EF of 61% which is unchanged. Previously, PFTs are adequate for treatment.  Proceed with cycle 4, day 15 of ABVD today.  Patient does not require Neulasta with this cycle.  Return to clinic in 2 weeks for further evaluation and consideration of cycle 5, day 1.  Will reimage with PET scan 1 to 2 days prior to her next infusion.   2.  Fertility preservation: Continue monthly Zoladex on day 15 of each treatment. 3.  Neutropenia: Resolved.  Patient does not require Neulasta with this treatment. 4.  Anemia: Hemoglobin decreased,  but stable at 10.0. 5.  Boil: Resolved. 6.  Peripheral neuropathy: Patient does not complain of this today. 7.  Nausea: Patient does not complain of this today.  Continue anti-emetics as prescribed.  Patient expressed understanding and was in agreement with this plan. She also understands that She can call clinic at any time with any questions, concerns, or complaints.   Cancer Staging Hodgkin's lymphoma [facility]) Staging form: Hodgkin and Non-Hodgkin Lymphoma, AJCC 8th Edition - Clinical stage from 05/13/2018: Stage II bulky (Hodgkin lymphoma, A - Asymptomatic) - Signed by Lloyd Huger, MD on 05/13/2018   Lloyd Huger, MD   08/27/2018 3:41 PM

## 2018-08-27 ENCOUNTER — Inpatient Hospital Stay: Payer: Commercial Managed Care - PPO

## 2018-08-27 ENCOUNTER — Other Ambulatory Visit: Payer: Self-pay

## 2018-08-27 ENCOUNTER — Inpatient Hospital Stay (HOSPITAL_BASED_OUTPATIENT_CLINIC_OR_DEPARTMENT_OTHER): Payer: Commercial Managed Care - PPO | Admitting: Oncology

## 2018-08-27 ENCOUNTER — Encounter: Payer: Self-pay | Admitting: Oncology

## 2018-08-27 VITALS — BP 135/86 | HR 111 | Temp 98.4°F | Ht 65.0 in | Wt 218.0 lb

## 2018-08-27 DIAGNOSIS — C8192 Hodgkin lymphoma, unspecified, intrathoracic lymph nodes: Secondary | ICD-10-CM

## 2018-08-27 DIAGNOSIS — C8112 Nodular sclerosis classical Hodgkin lymphoma, intrathoracic lymph nodes: Secondary | ICD-10-CM

## 2018-08-27 DIAGNOSIS — D701 Agranulocytosis secondary to cancer chemotherapy: Secondary | ICD-10-CM | POA: Insufficient documentation

## 2018-08-27 DIAGNOSIS — D649 Anemia, unspecified: Secondary | ICD-10-CM

## 2018-08-27 DIAGNOSIS — Z79899 Other long term (current) drug therapy: Secondary | ICD-10-CM

## 2018-08-27 DIAGNOSIS — Z5111 Encounter for antineoplastic chemotherapy: Secondary | ICD-10-CM

## 2018-08-27 DIAGNOSIS — J45909 Unspecified asthma, uncomplicated: Secondary | ICD-10-CM | POA: Diagnosis not present

## 2018-08-27 DIAGNOSIS — Z95828 Presence of other vascular implants and grafts: Secondary | ICD-10-CM

## 2018-08-27 DIAGNOSIS — Z880 Allergy status to penicillin: Secondary | ICD-10-CM | POA: Diagnosis not present

## 2018-08-27 DIAGNOSIS — Z7951 Long term (current) use of inhaled steroids: Secondary | ICD-10-CM | POA: Diagnosis not present

## 2018-08-27 DIAGNOSIS — Z510 Encounter for antineoplastic radiation therapy: Secondary | ICD-10-CM | POA: Diagnosis not present

## 2018-08-27 LAB — CBC WITH DIFFERENTIAL/PLATELET
Abs Immature Granulocytes: 0.73 10*3/uL — ABNORMAL HIGH (ref 0.00–0.07)
Basophils Absolute: 0.1 10*3/uL (ref 0.0–0.1)
Basophils Relative: 1 %
Eosinophils Absolute: 0.2 10*3/uL (ref 0.0–0.5)
Eosinophils Relative: 2 %
HCT: 31.5 % — ABNORMAL LOW (ref 36.0–46.0)
Hemoglobin: 10 g/dL — ABNORMAL LOW (ref 12.0–15.0)
Immature Granulocytes: 6 %
Lymphocytes Relative: 10 %
Lymphs Abs: 1.3 10*3/uL (ref 0.7–4.0)
MCH: 28.9 pg (ref 26.0–34.0)
MCHC: 31.7 g/dL (ref 30.0–36.0)
MCV: 91 fL (ref 80.0–100.0)
Monocytes Absolute: 0.9 10*3/uL (ref 0.1–1.0)
Monocytes Relative: 7 %
Neutro Abs: 9.4 10*3/uL — ABNORMAL HIGH (ref 1.7–7.7)
Neutrophils Relative %: 74 %
Platelets: 170 10*3/uL (ref 150–400)
RBC: 3.46 MIL/uL — ABNORMAL LOW (ref 3.87–5.11)
RDW: 19.5 % — ABNORMAL HIGH (ref 11.5–15.5)
WBC: 12.6 10*3/uL — ABNORMAL HIGH (ref 4.0–10.5)
nRBC: 0.2 % (ref 0.0–0.2)

## 2018-08-27 LAB — COMPREHENSIVE METABOLIC PANEL
ALT: 33 U/L (ref 0–44)
AST: 26 U/L (ref 15–41)
Albumin: 3.9 g/dL (ref 3.5–5.0)
Alkaline Phosphatase: 91 U/L (ref 38–126)
Anion gap: 9 (ref 5–15)
BUN: 16 mg/dL (ref 6–20)
CO2: 22 mmol/L (ref 22–32)
Calcium: 8.9 mg/dL (ref 8.9–10.3)
Chloride: 106 mmol/L (ref 98–111)
Creatinine, Ser: 0.69 mg/dL (ref 0.44–1.00)
GFR calc Af Amer: >60 mL/min (ref >60)
GFR calc non Af Amer: >60 mL/min (ref >60)
Glucose, Bld: 117 mg/dL — ABNORMAL HIGH (ref 70–99)
Potassium: 3.8 mmol/L (ref 3.5–5.1)
Sodium: 137 mmol/L (ref 135–145)
Total Bilirubin: 0.4 mg/dL (ref 0.3–1.2)
Total Protein: 7.2 g/dL (ref 6.5–8.1)

## 2018-08-27 MED ORDER — DOXORUBICIN HCL CHEMO IV INJECTION 2 MG/ML
25.0000 mg/m2 | Freq: Once | INTRAVENOUS | Status: AC
  Administered 2018-08-27: 11:00:00 52 mg via INTRAVENOUS
  Filled 2018-08-27: qty 26

## 2018-08-27 MED ORDER — SODIUM CHLORIDE 0.9 % IV SOLN
10.0000 [IU]/m2 | Freq: Once | INTRAVENOUS | Status: AC
  Administered 2018-08-27: 12:00:00 21 [IU] via INTRAVENOUS
  Filled 2018-08-27: qty 7

## 2018-08-27 MED ORDER — SODIUM CHLORIDE 0.9 % IV SOLN
Freq: Once | INTRAVENOUS | Status: AC
  Administered 2018-08-27: 10:00:00 via INTRAVENOUS
  Filled 2018-08-27: qty 5

## 2018-08-27 MED ORDER — SODIUM CHLORIDE 0.9 % IV SOLN
375.0000 mg/m2 | Freq: Once | INTRAVENOUS | Status: AC
  Administered 2018-08-27: 12:00:00 780 mg via INTRAVENOUS
  Filled 2018-08-27: qty 78

## 2018-08-27 MED ORDER — VINBLASTINE SULFATE CHEMO INJECTION 1 MG/ML
6.0000 mg/m2 | Freq: Once | INTRAVENOUS | Status: AC
  Administered 2018-08-27: 11:00:00 12.5 mg via INTRAVENOUS
  Filled 2018-08-27: qty 12.5

## 2018-08-27 MED ORDER — PALONOSETRON HCL INJECTION 0.25 MG/5ML
0.2500 mg | Freq: Once | INTRAVENOUS | Status: AC
  Administered 2018-08-27: 0.25 mg via INTRAVENOUS
  Filled 2018-08-27: qty 5

## 2018-08-27 MED ORDER — SODIUM CHLORIDE 0.9 % IV SOLN
Freq: Once | INTRAVENOUS | Status: AC
  Administered 2018-08-27: 10:00:00 via INTRAVENOUS
  Filled 2018-08-27: qty 250

## 2018-08-27 MED ORDER — HEPARIN SOD (PORK) LOCK FLUSH 100 UNIT/ML IV SOLN
500.0000 [IU] | Freq: Once | INTRAVENOUS | Status: AC | PRN
  Administered 2018-08-27: 14:00:00 500 [IU]
  Filled 2018-08-27: qty 5

## 2018-08-27 MED ORDER — SODIUM CHLORIDE 0.9% FLUSH
10.0000 mL | Freq: Once | INTRAVENOUS | Status: AC
  Administered 2018-08-27: 08:00:00 10 mL via INTRAVENOUS
  Filled 2018-08-27: qty 10

## 2018-08-27 NOTE — Progress Notes (Signed)
Patient stated that she had been doing well with no complaints. 

## 2018-08-27 NOTE — Progress Notes (Signed)
HR 111, ok to proceed per md

## 2018-09-06 ENCOUNTER — Ambulatory Visit
Admission: RE | Admit: 2018-09-06 | Discharge: 2018-09-06 | Disposition: A | Discharge status: Home / Self Care | Payer: Commercial Managed Care - PPO | Source: Ambulatory Visit | Attending: Oncology | Admitting: Oncology

## 2018-09-06 ENCOUNTER — Other Ambulatory Visit: Payer: Self-pay

## 2018-09-06 DIAGNOSIS — C8112 Nodular sclerosis classical Hodgkin lymphoma, intrathoracic lymph nodes: Secondary | ICD-10-CM | POA: Insufficient documentation

## 2018-09-06 DIAGNOSIS — R222 Localized swelling, mass and lump, trunk: Secondary | ICD-10-CM | POA: Insufficient documentation

## 2018-09-06 LAB — GLUCOSE, CAPILLARY: Glucose-Capillary: 91 mg/dL (ref 70–99)

## 2018-09-06 MED ORDER — FLUDEOXYGLUCOSE F - 18 (FDG) INJECTION
11.3000 | Freq: Once | INTRAVENOUS | Status: AC | PRN
  Administered 2018-09-06: 11.39 via INTRAVENOUS

## 2018-09-09 ENCOUNTER — Other Ambulatory Visit: Payer: Self-pay

## 2018-09-10 ENCOUNTER — Encounter: Payer: Self-pay | Admitting: Oncology

## 2018-09-10 ENCOUNTER — Inpatient Hospital Stay (HOSPITAL_BASED_OUTPATIENT_CLINIC_OR_DEPARTMENT_OTHER): Payer: Commercial Managed Care - PPO | Admitting: Oncology

## 2018-09-10 ENCOUNTER — Ambulatory Visit
Admission: RE | Admit: 2018-09-10 | Discharge: 2018-09-10 | Disposition: A | Discharge status: Home / Self Care | Payer: Commercial Managed Care - PPO | Source: Ambulatory Visit | Attending: Radiation Oncology | Admitting: Radiation Oncology

## 2018-09-10 ENCOUNTER — Other Ambulatory Visit: Payer: Self-pay

## 2018-09-10 ENCOUNTER — Inpatient Hospital Stay: Payer: Commercial Managed Care - PPO

## 2018-09-10 VITALS — BP 126/86 | HR 98 | Temp 97.5°F | Resp 18 | Wt 221.0 lb

## 2018-09-10 DIAGNOSIS — C8112 Nodular sclerosis classical Hodgkin lymphoma, intrathoracic lymph nodes: Secondary | ICD-10-CM

## 2018-09-10 DIAGNOSIS — Z510 Encounter for antineoplastic radiation therapy: Secondary | ICD-10-CM | POA: Insufficient documentation

## 2018-09-10 DIAGNOSIS — D649 Anemia, unspecified: Secondary | ICD-10-CM

## 2018-09-10 DIAGNOSIS — J45909 Unspecified asthma, uncomplicated: Secondary | ICD-10-CM | POA: Insufficient documentation

## 2018-09-10 DIAGNOSIS — D701 Agranulocytosis secondary to cancer chemotherapy: Secondary | ICD-10-CM | POA: Diagnosis not present

## 2018-09-10 DIAGNOSIS — Z880 Allergy status to penicillin: Secondary | ICD-10-CM | POA: Insufficient documentation

## 2018-09-10 DIAGNOSIS — Z79899 Other long term (current) drug therapy: Secondary | ICD-10-CM | POA: Diagnosis not present

## 2018-09-10 DIAGNOSIS — Z7951 Long term (current) use of inhaled steroids: Secondary | ICD-10-CM | POA: Insufficient documentation

## 2018-09-10 DIAGNOSIS — C8192 Hodgkin lymphoma, unspecified, intrathoracic lymph nodes: Secondary | ICD-10-CM

## 2018-09-10 LAB — COMPREHENSIVE METABOLIC PANEL
ALT: 46 U/L — ABNORMAL HIGH (ref 0–44)
AST: 31 U/L (ref 15–41)
Albumin: 3.7 g/dL (ref 3.5–5.0)
Alkaline Phosphatase: 80 U/L (ref 38–126)
Anion gap: 8 (ref 5–15)
BUN: 16 mg/dL (ref 6–20)
CO2: 25 mmol/L (ref 22–32)
Calcium: 9 mg/dL (ref 8.9–10.3)
Chloride: 106 mmol/L (ref 98–111)
Creatinine, Ser: 0.78 mg/dL (ref 0.44–1.00)
GFR calc Af Amer: >60 mL/min (ref >60)
GFR calc non Af Amer: >60 mL/min (ref >60)
Glucose, Bld: 109 mg/dL — ABNORMAL HIGH (ref 70–99)
Potassium: 4 mmol/L (ref 3.5–5.1)
Sodium: 139 mmol/L (ref 135–145)
Total Bilirubin: 0.4 mg/dL (ref 0.3–1.2)
Total Protein: 7 g/dL (ref 6.5–8.1)

## 2018-09-10 LAB — CBC WITH DIFFERENTIAL/PLATELET
Abs Immature Granulocytes: 0 10*3/uL (ref 0.00–0.07)
Basophils Absolute: 0 10*3/uL (ref 0.0–0.1)
Basophils Relative: 2 %
Eosinophils Absolute: 0.2 10*3/uL (ref 0.0–0.5)
Eosinophils Relative: 6 %
HCT: 31.4 % — ABNORMAL LOW (ref 36.0–46.0)
Hemoglobin: 9.8 g/dL — ABNORMAL LOW (ref 12.0–15.0)
Immature Granulocytes: 0 %
Lymphocytes Relative: 28 %
Lymphs Abs: 0.7 10*3/uL (ref 0.7–4.0)
MCH: 28.9 pg (ref 26.0–34.0)
MCHC: 31.2 g/dL (ref 30.0–36.0)
MCV: 92.6 fL (ref 80.0–100.0)
Monocytes Absolute: 0.5 10*3/uL (ref 0.1–1.0)
Monocytes Relative: 19 %
Neutro Abs: 1.2 10*3/uL — ABNORMAL LOW (ref 1.7–7.7)
Neutrophils Relative %: 45 %
Platelets: 269 10*3/uL (ref 150–400)
RBC: 3.39 MIL/uL — ABNORMAL LOW (ref 3.87–5.11)
RDW: 19.4 % — ABNORMAL HIGH (ref 11.5–15.5)
WBC: 2.6 10*3/uL — ABNORMAL LOW (ref 4.0–10.5)
nRBC: 0 % (ref 0.0–0.2)

## 2018-09-10 MED ORDER — HEPARIN SOD (PORK) LOCK FLUSH 100 UNIT/ML IV SOLN
500.0000 [IU] | Freq: Once | INTRAVENOUS | Status: AC
  Administered 2018-09-10: 10:00:00 500 [IU] via INTRAVENOUS
  Filled 2018-09-10: qty 5

## 2018-09-10 MED ORDER — SODIUM CHLORIDE 0.9% FLUSH
10.0000 mL | Freq: Once | INTRAVENOUS | Status: AC
  Administered 2018-09-10: 10 mL via INTRAVENOUS
  Filled 2018-09-10: qty 10

## 2018-09-10 NOTE — Progress Notes (Signed)
Patient reports worsening acid reflux with comfortable.

## 2018-09-10 NOTE — Consult Note (Signed)
NEW PATIENT EVALUATION  Name: Misty Combs  MRN: [id number]  Date:   09/10/2018     DOB: 1985/05/25   This 33 y.o. 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  REFERRING PHYSICIAN: Sistasis, Rowena, MD  CHIEF COMPLAINT: No chief complaint on file.   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   PREVIOUS INVESTIGATIONS:  PET/CT in serial fashion is reviewed Pathology reports reviewed Clinical notes reviewed  HPI: Patient is a pleasant 33 year old female who presented back in December 2019 with increasing shortness of breath and chest pain.  CT scan demonstrated a large mediastinal mass with biopsy positive for classic Hodgkin's disease.  Initial PET CT scan showed bulky anterior mediastinal mass which was markedly hyper metabolic as well as bilateral supraclavicular left axillary and left subpectoral lymphadenopathy.  No evidence of disease below the diaphragm was noted.  She was started on 4 cycles of ABVD chemotherapy which he tolerated fairly well.  Repeat PET scan showed persistent hypermetabolic disease in her chest and left axillary region prompting additional 4 more cycles of ABVD chemotherapy.  Most recent PET CT scan done this month shows further decrease in size of anterior mediastinal mass and decreased metabolic activity Duvall criteria 3.  She does have some fluctuating hypermetabolic subcutaneous nodules of unknown etiology.  She is seen today for consideration of involved field radiation.  She has no fever chills night sweats at this time she is having no dysphasia.  PLANNED TREATMENT REGIMEN: Involved field radiation therapy  PAST MEDICAL HISTORY:  has a past medical history of Asthma, Migraines, and UTI (lower urinary tract infection).    PAST SURGICAL HISTORY:  Past Surgical History:  Procedure Laterality  Date  . ELECTROMAGNETIC NAVIGATION BROCHOSCOPY Left 05/02/2018   Procedure: ELECTROMAGNETIC NAVIGATION BRONCHOSCOPY;  Surgeon: Flora Lipps, MD;  Location: [facility] ORS;  Service: Cardiopulmonary;  Laterality: Left;  . ESOPHAGOGASTRODUODENOSCOPY    . IR IMAGING GUIDED PORT INSERTION  05/14/2018  . KNEE SURGERY Left 2016    FAMILY HISTORY: family history includes Heart disease in her father; Hypertension in her mother.  SOCIAL HISTORY:  reports that she has never smoked. She has never used smokeless tobacco. She reports current alcohol use of about 2.0 standard drinks of alcohol per week. She reports that she does not use drugs.  ALLERGIES: Penicillins; Other; Sulfur; Tetanus toxoids; and Gluten meal  MEDICATIONS:  Current Outpatient Medications  Medication Sig Dispense Refill  . albuterol (PROVENTIL HFA;VENTOLIN HFA) 108 (90 BASE) MCG/ACT inhaler Inhale into the lungs every 6 (six) hours as needed for wheezing or shortness of breath.    Marland Kitchen amitriptyline (ELAVIL) 25 MG tablet Take 25 mg by mouth at bedtime.   1  . azelastine (ASTELIN) 0.1 % nasal spray Place 2 sprays into both nostrils 2 (two) times daily. Use in each nostril as directed     . Bepotastine Besilate (BEPREVE) 1.5 % SOLN Apply 1 drop to eye 2 (two) times daily.     . budesonide (PULMICORT) 0.5 MG/2ML nebulizer solution USE 2 MLS (0.5 MG TOTAL) BY NEBULIZATION 2 (TWO) TIMES DAILY. NASAL RINSE    . dicyclomine (BENTYL) 20 MG tablet Take 20 mg by mouth 4 (four) times daily as needed for spasms.   1  . fluticasone furoate-vilanterol (BREO ELLIPTA) 100-25 MCG/INH AEPB Inhale 1 puff into the lungs daily.     Marland Kitchen HYDROcodone-acetaminophen (NORCO/VICODIN)  5-325 MG tablet Take 1 tablet by mouth every 6 (six) hours as needed for moderate pain or severe pain. 20 tablet 0  . hydrOXYzine (VISTARIL) 25 MG capsule Take 1 capsule by mouth.    . levocetirizine (XYZAL) 5 MG tablet Take 5 mg by mouth daily.    Marland Kitchen lidocaine-prilocaine (EMLA) cream Apply  to affected area once 30 g 3  . Multiple Vitamin (MULTIVITAMIN WITH MINERALS) TABS tablet Take 1 tablet by mouth daily.    Marland Kitchen omeprazole (PRILOSEC) 20 MG capsule Take 20 mg by mouth daily.    . ondansetron (ZOFRAN) 8 MG tablet Take 1 tablet (8 mg total) by mouth 2 (two) times daily as needed. 30 tablet 2  . prochlorperazine (COMPAZINE) 10 MG tablet Take 1 tablet (10 mg total) by mouth every 6 (six) hours as needed (Nausea or vomiting). 60 tablet 2   No current facility-administered medications for this encounter.     ECOG PERFORMANCE STATUS:  0 - Asymptomatic  REVIEW OF SYSTEMS: Patient denies any weight loss, fatigue, weakness, fever, chills or night sweats. Patient denies any loss of vision, blurred vision. Patient denies any ringing  of the ears or hearing loss. No irregular heartbeat. Patient denies heart murmur or history of fainting. Patient denies any chest pain or pain radiating to her upper extremities. Patient denies any shortness of breath, difficulty breathing at night, cough or hemoptysis. Patient denies any swelling in the lower legs. Patient denies any nausea vomiting, vomiting of blood, or coffee ground material in the vomitus. Patient denies any stomach pain. Patient states has had normal bowel movements no significant constipation or diarrhea. Patient denies any dysuria, hematuria or significant nocturia. Patient denies any problems walking, swelling in the joints or loss of balance. Patient denies any skin changes, loss of hair or loss of weight. Patient denies any excessive worrying or anxiety or significant depression. Patient denies any problems with insomnia. Patient denies excessive thirst, polyuria, polydipsia. Patient denies any swollen glands, patient denies easy bruising or easy bleeding. Patient denies any recent infections, allergies or URI. Patient "s visual fields have not changed significantly in recent time.   PHYSICAL EXAM: There were no vitals taken for this  visit. Well-developed well-nourished patient in NAD. HEENT reveals PERLA, EOMI, discs not visualized.  Oral cavity is clear. No oral mucosal lesions are identified. Neck is clear without evidence of cervical or supraclavicular adenopathy. Lungs are clear to A&P. Cardiac examination is essentially unremarkable with regular rate and rhythm without murmur rub or thrill. Abdomen is benign with no organomegaly or masses noted. Motor sensory and DTR levels are equal and symmetric in the upper and lower extremities. Cranial nerves II through XII are grossly intact. Proprioception is intact. No peripheral adenopathy or edema is identified. No motor or sensory levels are noted. Crude visual fields are within normal range.  LABORATORY DATA: Pathology report reviewed    RADIOLOGY RESULTS: Serial sets PET CT scans are reviewed and compatible with above-stated findings   IMPRESSION: Stage II classic Hodgkin's disease in 33 year old female with excellent response to chemotherapy both original bulky disease now for involved field radiation.  PLAN: At this time a to go ahead with involved field radiation therapy.  Would bring her chest to 2880 cGy in 16 fractions.  I will cover her original original mediastinal fields as well as her bilateral supraclavicular regions and left axillary region.  Would also boost the area of her original large mediastinal mass another 7.2 Gy in 4 fractions.  We will use original PET data for fusion study.  Risks and benefits of treatment including increased possible radiation esophagitis fatigue alteration of blood counts possible damage normal lung were all described in detail to the patient.  Patient's parents were on the phone during my conversation.  I have personally set up and ordered CT simulation for later this week.  All parties agree with and comprehend my treatment plan well.    I would like to take this opportunity to thank you for allowing me to participate in the care of  your patient.Noreene Filbert, MD

## 2018-09-10 NOTE — Progress Notes (Signed)
[place]  [phone] [phone]  ID: Misty Combs OB: 08-11-1985  MR#: [id number]  HYW#:[id number]  Patient Care Team: Gala Lewandowsky, MD as PCP - General (Family Medicine)  CHIEF COMPLAINT: Stage II Hodgkin's lymphoma.  INTERVAL HISTORY: Patient returns to clinic today for further evaluation, discussion of her imaging results, and additional treatment planning.  She continues to feel well and remains asymptomatic. She has no neurologic complaints.  She denies any fevers, chills, night sweats, or weight loss.  She denies any chest pain, shortness of breath, cough, or hemoptysis.  She denies any nausea, vomiting, constipation, or diarrhea.  She has no urinary complaints.  Patient offers no specific complaints today.  REVIEW OF SYSTEMS:   Review of Systems  Constitutional: Negative.  Negative for fever, malaise/fatigue and weight loss.  Respiratory: Negative.  Negative for cough and shortness of breath.   Cardiovascular: Negative.  Negative for chest pain and leg swelling.  Gastrointestinal: Negative.  Negative for abdominal pain, blood in stool and melena.  Genitourinary: Negative.  Negative for dysuria and flank pain.  Musculoskeletal: Negative.  Negative for back pain.  Skin: Negative.  Negative for itching and rash.  Neurological: Negative.  Negative for focal weakness, weakness and headaches.  Psychiatric/Behavioral: Negative.  The patient is not nervous/anxious.     As per HPI. Otherwise, a complete review of systems is negative.  PAST MEDICAL HISTORY: Past Medical History:  Diagnosis Date   Asthma    exercise induced   Migraines    no aura   UTI (lower urinary tract infection)    post coital    PAST SURGICAL HISTORY: Past Surgical History:  Procedure Laterality Date   ELECTROMAGNETIC NAVIGATION BROCHOSCOPY Left 05/02/2018   Procedure: ELECTROMAGNETIC NAVIGATION BRONCHOSCOPY;  Surgeon: Flora Lipps, MD;   Location: [facility] ORS;  Service: Cardiopulmonary;  Laterality: Left;   ESOPHAGOGASTRODUODENOSCOPY     IR IMAGING GUIDED PORT INSERTION  05/14/2018   KNEE SURGERY Left 2016    FAMILY HISTORY: Family History  Problem Relation Age of Onset   Hypertension Mother    Heart disease Father     ADVANCED DIRECTIVES (Y/N):  N  HEALTH MAINTENANCE: Social History   Tobacco Use   Smoking status: Never Smoker   Smokeless tobacco: Never Used  Substance Use Topics   Alcohol use: Yes    Alcohol/week: 2.0 standard drinks    Types: 2 Glasses of wine per week   Drug use: No     Colonoscopy:  PAP:  Bone density:  Lipid panel:  Allergies  Allergen Reactions   Penicillins Anaphylaxis   Other     allergic to cats   Sulfur Nausea And Vomiting    Dizzy   Tetanus Toxoids Swelling    Fever and swelling   Gluten Meal Rash    Sensitive to gluten    Current Outpatient Medications  Medication Sig Dispense Refill   albuterol (PROVENTIL HFA;VENTOLIN HFA) 108 (90 BASE) MCG/ACT inhaler Inhale into the lungs every 6 (six) hours as needed for wheezing or shortness of breath.     amitriptyline (ELAVIL) 25 MG tablet Take 25 mg by mouth at bedtime.   1   azelastine (ASTELIN) 0.1 % nasal spray Place 2 sprays into both nostrils 2 (two) times daily. Use in each nostril as directed      Bepotastine Besilate (BEPREVE) 1.5 % SOLN Apply 1 drop to eye 2 (two) times daily.      budesonide (PULMICORT) 0.5 MG/2ML nebulizer solution USE  2 MLS (0.5 MG TOTAL) BY NEBULIZATION 2 (TWO) TIMES DAILY. NASAL RINSE     dicyclomine (BENTYL) 20 MG tablet Take 20 mg by mouth 4 (four) times daily as needed for spasms.   1   fluticasone furoate-vilanterol (BREO ELLIPTA) 100-25 MCG/INH AEPB Inhale 1 puff into the lungs daily.      hydrOXYzine (VISTARIL) 25 MG capsule Take 1 capsule by mouth.     levocetirizine (XYZAL) 5 MG tablet Take 5 mg by mouth daily.     lidocaine-prilocaine (EMLA) cream Apply to  affected area once 30 g 3   Multiple Vitamin (MULTIVITAMIN WITH MINERALS) TABS tablet Take 1 tablet by mouth daily.     omeprazole (PRILOSEC) 20 MG capsule Take 20 mg by mouth daily.     ondansetron (ZOFRAN) 8 MG tablet Take 1 tablet (8 mg total) by mouth 2 (two) times daily as needed. 30 tablet 2   prochlorperazine (COMPAZINE) 10 MG tablet Take 1 tablet (10 mg total) by mouth every 6 (six) hours as needed (Nausea or vomiting). 60 tablet 2   HYDROcodone-acetaminophen (NORCO/VICODIN) 5-325 MG tablet Take 1 tablet by mouth every 6 (six) hours as needed for moderate pain or severe pain. 20 tablet 0   No current facility-administered medications for this visit.     OBJECTIVE: Vitals:   09/10/18 0848  BP: 126/86  Pulse: 98  Resp: 18  Temp: (!) 97.5 F (36.4 C)     Body mass index is 36.78 kg/m.    ECOG FS:0 - Asymptomatic  General: Well-developed, well-nourished, no acute distress. Eyes: Pink conjunctiva, anicteric sclera. HEENT: Normocephalic, moist mucous membranes. Lungs: Clear to auscultation bilaterally. Heart: Regular rate and rhythm. No rubs, murmurs, or gallops. Abdomen: Soft, nontender, nondistended. No organomegaly noted, normoactive bowel sounds. Musculoskeletal: No edema, cyanosis, or clubbing. Neuro: Alert, answering all questions appropriately. Cranial nerves grossly intact. Skin: No rashes or petechiae noted. Psych: Normal affect. Lymphatics: No cervical, calvicular, axillary or inguinal LAD.   LAB RESULTS:  Lab Results  Component Value Date   NA 139 09/10/2018   K 4.0 09/10/2018   CL 106 09/10/2018   CO2 25 09/10/2018   GLUCOSE 109 (H) 09/10/2018   BUN 16 09/10/2018   CREATININE 0.78 09/10/2018   CALCIUM 9.0 09/10/2018   PROT 7.0 09/10/2018   ALBUMIN 3.7 09/10/2018   AST 31 09/10/2018   ALT 46 (H) 09/10/2018   ALKPHOS 80 09/10/2018   BILITOT 0.4 09/10/2018   GFRNONAA >60 09/10/2018   GFRAA >60 09/10/2018    Lab Results  Component Value Date    WBC 2.6 (L) 09/10/2018   NEUTROABS 1.2 (L) 09/10/2018   HGB 9.8 (L) 09/10/2018   HCT 31.4 (L) 09/10/2018   MCV 92.6 09/10/2018   PLT 269 09/10/2018     STUDIES: Nm Pet Image Restag (ps) Skull Base To Thigh  Result Date: 09/06/2018 CLINICAL DATA:  Subsequent treatment strategy for nodular sclerosing Hodgkin's lymphoma. EXAM: NUCLEAR MEDICINE PET SKULL BASE TO THIGH TECHNIQUE: 11.39 mCi F-18 FDG was injected intravenously. Full-ring PET imaging was performed from the skull base to thigh after the radiotracer. CT data was obtained and used for attenuation correction and anatomic localization. Fasting blood glucose: 91 mg/dl COMPARISON:  PET-CT 07/13/2018 and 05/10/2018 FINDINGS: Mediastinal blood pool activity: SUV max 1.8. Hepatic activity: SUV max 3.9. NECK: No hypermetabolic cervical lymph nodes are identified.There are no lesions of the pharyngeal mucosal space. Incidental CT findings: none CHEST: Further decreased size nodal mass in the pre-vascular space, measuring  approximately 5.0 x 2.0 cm on image 83/4. This currently has an SUV max of 2.7 (previously 3.2). No recurrent or progressive mediastinal or axillary lymphadenopathy. No suspicious pulmonary activity. Incidental CT findings: Mild atelectasis at the lung bases. Right IJ Port-A-Cath extends into the mid right atrium. A small pericardial effusion is unchanged. ABDOMEN/PELVIS: There is no hypermetabolic activity within the liver, adrenal glands, spleen or pancreas. Splenic activity is homogeneous and less than liver (SUV max 3.1). There are no hypermetabolic abdominopelvic lymph nodes. Incidental CT findings: Hepatic steatosis. SKELETON: The previously demonstrated diffusely increased marrow uptake has resolved. There is no focal osseous activity to suggest metastatic disease. Scattered hypermetabolic subcutaneous nodules are again noted. Most of these are improved compared with the prior study. The components in the right axilla and midline  anterior chest have resolved. The component in the intergluteal crease has improved (SUV max 1.9, previously 5.7). There are new hypermetabolic subcutaneous lesions posteriorly in the mid neck (SUV max 3.4), the left axilla (SUV max 4.8) and in the right suprapubic area (SUV max 2.8). Incidental CT findings: Stable small sebaceous cyst in the midline upper back (image 53/4). IMPRESSION: 1. Further decrease in size of the anterior mediastinal mass and decreased metabolic activity (Deauville criteria 3). No progressive/recurrent adenopathy. 2. No residual hypermetabolic splenic activity. 3. Fluctuating hypermetabolic subcutaneous nodules, etiology indeterminate. Some have improved from the prior study, although at least 3 new small lesions (posteriorly in the mid neck, in the left axilla and in the right suprapubic area) appear new. These could be inflammatory, and previously, the patient was reported to have a boil on her back. Cutaneous/subcutaneous lymphoma not completely excluded. 4. Resolved treatment related changes in the bone marrow. Electronically Signed   By: Richardean Sale M.D.   On: 09/06/2018 14:33    ASSESSMENT: Stage II Hodgkin's lymphoma.  PLAN:    1.  Stage II Hodgkin's lymphoma: PET scan results from September 06, 2018 reviewed independently and report as above with further decrease in size of anterior mediastinal mass as well as decreased metabolic activity.  (Deauville criteria 3).  No other evidence of significant hypermetabolic activity.  No further chemotherapy is necessary at this time.  Patient had consultation with radiation oncology today and will proceed with XRT to the residual anterior mediastinal mass.  Patient return to clinic in approximately 1 month at the conclusion of XRT for further evaluation and scheduling of her next imaging.   2.  Fertility preservation: Patient has declined Zoladex her past several injections. 3.  Neutropenia: Secondary to treatment.  No further  chemotherapy as above. 4.  Anemia: Hemoglobin decreased, but stable at 9.8.   5.  Boil: Resolved. 6.  Peripheral neuropathy: Patient does not complain of this today. 7.  Nausea: Patient does not complain of this today.  Continue anti-emetics as prescribed.  I spent a total of 30 minutes face-to-face with the patient of which greater than 50% of the visit was spent in counseling and coordination of care as detailed above.   Patient expressed understanding and was in agreement with this plan. She also understands that She can call clinic at any time with any questions, concerns, or complaints.   Cancer Staging Hodgkin's lymphoma [facility]) Staging form: Hodgkin and Non-Hodgkin Lymphoma, AJCC 8th Edition - Clinical stage from 05/13/2018: Stage II bulky (Hodgkin lymphoma, A - Asymptomatic) - Signed by Lloyd Huger, MD on 05/13/2018   Lloyd Huger, MD   09/10/2018 11:51 AM

## 2018-09-11 ENCOUNTER — Other Ambulatory Visit: Payer: Self-pay

## 2018-09-12 ENCOUNTER — Ambulatory Visit
Admission: RE | Admit: 2018-09-12 | Discharge: 2018-09-12 | Disposition: A | Discharge status: Home / Self Care | Payer: Commercial Managed Care - PPO | Source: Ambulatory Visit | Attending: Radiation Oncology | Admitting: Radiation Oncology

## 2018-09-12 ENCOUNTER — Other Ambulatory Visit: Payer: Self-pay

## 2018-09-12 DIAGNOSIS — Z510 Encounter for antineoplastic radiation therapy: Secondary | ICD-10-CM | POA: Diagnosis not present

## 2018-09-14 ENCOUNTER — Other Ambulatory Visit: Payer: Self-pay | Admitting: *Deleted

## 2018-09-14 DIAGNOSIS — C8112 Nodular sclerosis classical Hodgkin lymphoma, intrathoracic lymph nodes: Secondary | ICD-10-CM

## 2018-09-17 DIAGNOSIS — Z510 Encounter for antineoplastic radiation therapy: Secondary | ICD-10-CM | POA: Diagnosis not present

## 2018-09-19 ENCOUNTER — Other Ambulatory Visit: Payer: Self-pay

## 2018-09-19 ENCOUNTER — Ambulatory Visit
Admission: RE | Admit: 2018-09-19 | Discharge: 2018-09-19 | Disposition: A | Discharge status: Home / Self Care | Payer: Commercial Managed Care - PPO | Source: Ambulatory Visit | Attending: Radiation Oncology | Admitting: Radiation Oncology

## 2018-09-19 DIAGNOSIS — Z510 Encounter for antineoplastic radiation therapy: Secondary | ICD-10-CM | POA: Diagnosis not present

## 2018-09-20 ENCOUNTER — Other Ambulatory Visit: Payer: Self-pay

## 2018-09-20 ENCOUNTER — Ambulatory Visit
Admission: RE | Admit: 2018-09-20 | Discharge: 2018-09-20 | Disposition: A | Discharge status: Home / Self Care | Payer: Commercial Managed Care - PPO | Source: Ambulatory Visit | Attending: Radiation Oncology | Admitting: Radiation Oncology

## 2018-09-20 DIAGNOSIS — Z510 Encounter for antineoplastic radiation therapy: Secondary | ICD-10-CM | POA: Diagnosis not present

## 2018-09-21 ENCOUNTER — Ambulatory Visit
Admission: RE | Admit: 2018-09-21 | Discharge: 2018-09-21 | Disposition: A | Discharge status: Home / Self Care | Payer: Commercial Managed Care - PPO | Source: Ambulatory Visit | Attending: Radiation Oncology | Admitting: Radiation Oncology

## 2018-09-21 ENCOUNTER — Other Ambulatory Visit: Payer: Self-pay

## 2018-09-21 DIAGNOSIS — Z510 Encounter for antineoplastic radiation therapy: Secondary | ICD-10-CM | POA: Insufficient documentation

## 2018-09-21 DIAGNOSIS — Z79899 Other long term (current) drug therapy: Secondary | ICD-10-CM | POA: Insufficient documentation

## 2018-09-21 DIAGNOSIS — Z7951 Long term (current) use of inhaled steroids: Secondary | ICD-10-CM | POA: Insufficient documentation

## 2018-09-21 DIAGNOSIS — J45909 Unspecified asthma, uncomplicated: Secondary | ICD-10-CM | POA: Insufficient documentation

## 2018-09-21 DIAGNOSIS — C8112 Nodular sclerosis classical Hodgkin lymphoma, intrathoracic lymph nodes: Secondary | ICD-10-CM | POA: Insufficient documentation

## 2018-09-21 DIAGNOSIS — Z880 Allergy status to penicillin: Secondary | ICD-10-CM | POA: Insufficient documentation

## 2018-09-24 ENCOUNTER — Other Ambulatory Visit: Payer: Self-pay

## 2018-09-24 ENCOUNTER — Ambulatory Visit
Admission: RE | Admit: 2018-09-24 | Discharge: 2018-09-24 | Disposition: A | Discharge status: Home / Self Care | Payer: Commercial Managed Care - PPO | Source: Ambulatory Visit | Attending: Radiation Oncology | Admitting: Radiation Oncology

## 2018-09-24 DIAGNOSIS — Z510 Encounter for antineoplastic radiation therapy: Secondary | ICD-10-CM | POA: Diagnosis not present

## 2018-09-25 ENCOUNTER — Ambulatory Visit
Admission: RE | Admit: 2018-09-25 | Discharge: 2018-09-25 | Disposition: A | Discharge status: Home / Self Care | Payer: Commercial Managed Care - PPO | Source: Ambulatory Visit | Attending: Radiation Oncology | Admitting: Radiation Oncology

## 2018-09-25 ENCOUNTER — Other Ambulatory Visit: Payer: Self-pay

## 2018-09-25 DIAGNOSIS — Z510 Encounter for antineoplastic radiation therapy: Secondary | ICD-10-CM | POA: Diagnosis not present

## 2018-09-26 ENCOUNTER — Other Ambulatory Visit: Payer: Self-pay

## 2018-09-26 ENCOUNTER — Ambulatory Visit
Admission: RE | Admit: 2018-09-26 | Discharge: 2018-09-26 | Disposition: A | Discharge status: Home / Self Care | Payer: Commercial Managed Care - PPO | Source: Ambulatory Visit | Attending: Radiation Oncology | Admitting: Radiation Oncology

## 2018-09-26 DIAGNOSIS — Z510 Encounter for antineoplastic radiation therapy: Secondary | ICD-10-CM | POA: Diagnosis not present

## 2018-09-27 ENCOUNTER — Ambulatory Visit
Admission: RE | Admit: 2018-09-27 | Discharge: 2018-09-27 | Disposition: A | Discharge status: Home / Self Care | Payer: Commercial Managed Care - PPO | Source: Ambulatory Visit | Attending: Radiation Oncology | Admitting: Radiation Oncology

## 2018-09-27 ENCOUNTER — Other Ambulatory Visit: Payer: Self-pay

## 2018-09-27 DIAGNOSIS — Z510 Encounter for antineoplastic radiation therapy: Secondary | ICD-10-CM | POA: Diagnosis not present

## 2018-09-28 ENCOUNTER — Ambulatory Visit
Admission: RE | Admit: 2018-09-28 | Discharge: 2018-09-28 | Disposition: A | Discharge status: Home / Self Care | Payer: Commercial Managed Care - PPO | Source: Ambulatory Visit | Attending: Radiation Oncology | Admitting: Radiation Oncology

## 2018-09-28 ENCOUNTER — Other Ambulatory Visit: Payer: Self-pay

## 2018-09-28 DIAGNOSIS — Z510 Encounter for antineoplastic radiation therapy: Secondary | ICD-10-CM | POA: Diagnosis not present

## 2018-10-01 ENCOUNTER — Other Ambulatory Visit: Payer: Self-pay

## 2018-10-01 ENCOUNTER — Inpatient Hospital Stay: Payer: Commercial Managed Care - PPO | Attending: Radiation Oncology

## 2018-10-01 ENCOUNTER — Ambulatory Visit
Admission: RE | Admit: 2018-10-01 | Discharge: 2018-10-01 | Disposition: A | Discharge status: Home / Self Care | Payer: Commercial Managed Care - PPO | Source: Ambulatory Visit | Attending: Radiation Oncology | Admitting: Radiation Oncology

## 2018-10-01 DIAGNOSIS — C8112 Nodular sclerosis classical Hodgkin lymphoma, intrathoracic lymph nodes: Secondary | ICD-10-CM

## 2018-10-01 DIAGNOSIS — Z510 Encounter for antineoplastic radiation therapy: Secondary | ICD-10-CM | POA: Diagnosis not present

## 2018-10-01 LAB — CBC
HCT: 34.5 % — ABNORMAL LOW (ref 36.0–46.0)
Hemoglobin: 11.1 g/dL — ABNORMAL LOW (ref 12.0–15.0)
MCH: 29.1 pg (ref 26.0–34.0)
MCHC: 32.2 g/dL (ref 30.0–36.0)
MCV: 90.3 fL (ref 80.0–100.0)
Platelets: 172 10*3/uL (ref 150–400)
RBC: 3.82 MIL/uL — ABNORMAL LOW (ref 3.87–5.11)
RDW: 16.1 % — ABNORMAL HIGH (ref 11.5–15.5)
WBC: 6.1 10*3/uL (ref 4.0–10.5)
nRBC: 0 % (ref 0.0–0.2)

## 2018-10-02 ENCOUNTER — Other Ambulatory Visit: Payer: Self-pay

## 2018-10-02 ENCOUNTER — Other Ambulatory Visit: Payer: Self-pay | Admitting: *Deleted

## 2018-10-02 ENCOUNTER — Ambulatory Visit
Admission: RE | Admit: 2018-10-02 | Discharge: 2018-10-02 | Disposition: A | Discharge status: Home / Self Care | Payer: Commercial Managed Care - PPO | Source: Ambulatory Visit | Attending: Radiation Oncology | Admitting: Radiation Oncology

## 2018-10-02 DIAGNOSIS — Z510 Encounter for antineoplastic radiation therapy: Secondary | ICD-10-CM | POA: Diagnosis not present

## 2018-10-02 MED ORDER — SUCRALFATE 1 G PO TABS: 1.0000 g | ORAL_TABLET | Freq: Three times a day (TID) | ORAL | 3 refills | Status: DC

## 2018-10-03 ENCOUNTER — Ambulatory Visit
Admission: RE | Admit: 2018-10-03 | Discharge: 2018-10-03 | Disposition: A | Discharge status: Home / Self Care | Payer: Commercial Managed Care - PPO | Source: Ambulatory Visit | Attending: Radiation Oncology | Admitting: Radiation Oncology

## 2018-10-03 ENCOUNTER — Other Ambulatory Visit: Payer: Self-pay

## 2018-10-03 DIAGNOSIS — Z510 Encounter for antineoplastic radiation therapy: Secondary | ICD-10-CM | POA: Diagnosis not present

## 2018-10-04 ENCOUNTER — Ambulatory Visit
Admission: RE | Admit: 2018-10-04 | Discharge: 2018-10-04 | Disposition: A | Discharge status: Home / Self Care | Payer: Commercial Managed Care - PPO | Source: Ambulatory Visit | Attending: Radiation Oncology | Admitting: Radiation Oncology

## 2018-10-04 ENCOUNTER — Other Ambulatory Visit: Payer: Self-pay

## 2018-10-04 DIAGNOSIS — Z510 Encounter for antineoplastic radiation therapy: Secondary | ICD-10-CM | POA: Diagnosis not present

## 2018-10-05 ENCOUNTER — Other Ambulatory Visit: Payer: Self-pay

## 2018-10-05 ENCOUNTER — Ambulatory Visit
Admission: RE | Admit: 2018-10-05 | Discharge: 2018-10-05 | Disposition: A | Discharge status: Home / Self Care | Payer: Commercial Managed Care - PPO | Source: Ambulatory Visit | Attending: Radiation Oncology | Admitting: Radiation Oncology

## 2018-10-05 DIAGNOSIS — Z510 Encounter for antineoplastic radiation therapy: Secondary | ICD-10-CM | POA: Diagnosis not present

## 2018-10-08 ENCOUNTER — Other Ambulatory Visit: Payer: Self-pay

## 2018-10-08 ENCOUNTER — Inpatient Hospital Stay: Payer: Commercial Managed Care - PPO

## 2018-10-08 ENCOUNTER — Ambulatory Visit
Admission: RE | Admit: 2018-10-08 | Discharge: 2018-10-08 | Disposition: A | Discharge status: Home / Self Care | Payer: Commercial Managed Care - PPO | Source: Ambulatory Visit | Attending: Radiation Oncology | Admitting: Radiation Oncology

## 2018-10-08 DIAGNOSIS — Z510 Encounter for antineoplastic radiation therapy: Secondary | ICD-10-CM | POA: Diagnosis not present

## 2018-10-08 DIAGNOSIS — C8112 Nodular sclerosis classical Hodgkin lymphoma, intrathoracic lymph nodes: Secondary | ICD-10-CM

## 2018-10-08 LAB — CBC
HCT: 37.4 % (ref 36.0–46.0)
Hemoglobin: 12 g/dL (ref 12.0–15.0)
MCH: 28.8 pg (ref 26.0–34.0)
MCHC: 32.1 g/dL (ref 30.0–36.0)
MCV: 89.9 fL (ref 80.0–100.0)
Platelets: 201 10*3/uL (ref 150–400)
RBC: 4.16 MIL/uL (ref 3.87–5.11)
RDW: 15.3 % (ref 11.5–15.5)
WBC: 7.7 10*3/uL (ref 4.0–10.5)
nRBC: 0 % (ref 0.0–0.2)

## 2018-10-09 ENCOUNTER — Other Ambulatory Visit: Payer: Self-pay

## 2018-10-09 ENCOUNTER — Ambulatory Visit
Admission: RE | Admit: 2018-10-09 | Discharge: 2018-10-09 | Disposition: A | Discharge status: Home / Self Care | Payer: Commercial Managed Care - PPO | Source: Ambulatory Visit | Attending: Radiation Oncology | Admitting: Radiation Oncology

## 2018-10-09 DIAGNOSIS — Z510 Encounter for antineoplastic radiation therapy: Secondary | ICD-10-CM | POA: Diagnosis not present

## 2018-10-10 ENCOUNTER — Other Ambulatory Visit: Payer: Self-pay

## 2018-10-10 ENCOUNTER — Ambulatory Visit
Admission: RE | Admit: 2018-10-10 | Discharge: 2018-10-10 | Disposition: A | Discharge status: Home / Self Care | Payer: Commercial Managed Care - PPO | Source: Ambulatory Visit | Attending: Radiation Oncology | Admitting: Radiation Oncology

## 2018-10-10 DIAGNOSIS — Z510 Encounter for antineoplastic radiation therapy: Secondary | ICD-10-CM | POA: Diagnosis not present

## 2018-10-11 ENCOUNTER — Other Ambulatory Visit: Payer: Self-pay

## 2018-10-11 ENCOUNTER — Ambulatory Visit
Admission: RE | Admit: 2018-10-11 | Discharge: 2018-10-11 | Disposition: A | Discharge status: Home / Self Care | Payer: Commercial Managed Care - PPO | Source: Ambulatory Visit | Attending: Radiation Oncology | Admitting: Radiation Oncology

## 2018-10-11 DIAGNOSIS — Z510 Encounter for antineoplastic radiation therapy: Secondary | ICD-10-CM | POA: Diagnosis not present

## 2018-10-12 ENCOUNTER — Ambulatory Visit
Admission: RE | Admit: 2018-10-12 | Discharge: 2018-10-12 | Disposition: A | Discharge status: Home / Self Care | Payer: Commercial Managed Care - PPO | Source: Ambulatory Visit | Attending: Radiation Oncology | Admitting: Radiation Oncology

## 2018-10-12 ENCOUNTER — Other Ambulatory Visit: Payer: Self-pay

## 2018-10-12 DIAGNOSIS — Z510 Encounter for antineoplastic radiation therapy: Secondary | ICD-10-CM | POA: Diagnosis not present

## 2018-10-16 ENCOUNTER — Ambulatory Visit: Admission: RE | Admit: 2018-10-16 | Payer: Commercial Managed Care - PPO | Source: Ambulatory Visit

## 2018-10-16 ENCOUNTER — Other Ambulatory Visit: Payer: Self-pay

## 2018-10-16 ENCOUNTER — Ambulatory Visit
Admission: RE | Admit: 2018-10-16 | Discharge: 2018-10-16 | Disposition: A | Discharge status: Home / Self Care | Payer: Commercial Managed Care - PPO | Source: Ambulatory Visit | Attending: Radiation Oncology | Admitting: Radiation Oncology

## 2018-10-16 ENCOUNTER — Ambulatory Visit: Payer: Commercial Managed Care - PPO

## 2018-10-16 DIAGNOSIS — Z510 Encounter for antineoplastic radiation therapy: Secondary | ICD-10-CM | POA: Diagnosis not present

## 2018-10-17 ENCOUNTER — Other Ambulatory Visit: Payer: Self-pay

## 2018-10-17 ENCOUNTER — Ambulatory Visit: Payer: Commercial Managed Care - PPO

## 2018-10-17 ENCOUNTER — Ambulatory Visit
Admission: RE | Admit: 2018-10-17 | Discharge: 2018-10-17 | Disposition: A | Discharge status: Home / Self Care | Payer: Commercial Managed Care - PPO | Source: Ambulatory Visit | Attending: Radiation Oncology | Admitting: Radiation Oncology

## 2018-10-17 DIAGNOSIS — Z510 Encounter for antineoplastic radiation therapy: Secondary | ICD-10-CM | POA: Diagnosis not present

## 2018-10-18 ENCOUNTER — Ambulatory Visit: Payer: Commercial Managed Care - PPO

## 2018-10-18 ENCOUNTER — Other Ambulatory Visit: Payer: Self-pay

## 2018-10-18 ENCOUNTER — Ambulatory Visit
Admission: RE | Admit: 2018-10-18 | Discharge: 2018-10-18 | Disposition: A | Discharge status: Home / Self Care | Payer: Commercial Managed Care - PPO | Source: Ambulatory Visit | Attending: Radiation Oncology | Admitting: Radiation Oncology

## 2018-10-18 DIAGNOSIS — Z510 Encounter for antineoplastic radiation therapy: Secondary | ICD-10-CM | POA: Diagnosis not present

## 2018-10-19 ENCOUNTER — Ambulatory Visit: Payer: Commercial Managed Care - PPO

## 2018-11-14 ENCOUNTER — Encounter: Payer: Self-pay | Admitting: Oncology

## 2018-11-18 NOTE — Progress Notes (Signed)
[place]  Telephone:(336) [phone] [phone]) [phone]  ID: Misty Combs OB: 1985-07-19  MR#: [id number]  [facility]#:[id number]  Patient Care Team: Gala Lewandowsky, MD as PCP - General (Family Medicine)  CHIEF COMPLAINT: Stage II Hodgkin's lymphoma.  INTERVAL HISTORY: Patient returns to clinic today for routine evaluation.  She completed her XRT approximately 1 month ago.  She currently feels well and is asymptomatic.  She has no neurologic complaints.  She denies any fevers, chills, night sweats, or weight loss.  She denies any chest pain, shortness of breath, cough, or hemoptysis.  She denies any nausea, vomiting, constipation, or diarrhea.  She has no urinary complaints.  Patient feels at her baseline offers no specific complaints today.  REVIEW OF SYSTEMS:   Review of Systems  Constitutional: Negative.  Negative for fever, malaise/fatigue and weight loss.  Respiratory: Negative.  Negative for cough and shortness of breath.   Cardiovascular: Negative.  Negative for chest pain and leg swelling.  Gastrointestinal: Negative.  Negative for abdominal pain, blood in stool and melena.  Genitourinary: Negative.  Negative for dysuria and flank pain.  Musculoskeletal: Negative.  Negative for back pain.  Skin: Negative.  Negative for itching and rash.  Neurological: Negative.  Negative for focal weakness, weakness and headaches.  Psychiatric/Behavioral: Negative.  The patient is not nervous/anxious.     As per HPI. Otherwise, a complete review of systems is negative.  PAST MEDICAL HISTORY: Past Medical History:  Diagnosis Date  . Asthma    exercise induced  . Migraines    no aura  . UTI (lower urinary tract infection)    post coital    PAST SURGICAL HISTORY: Past Surgical History:  Procedure Laterality Date  . ELECTROMAGNETIC NAVIGATION BROCHOSCOPY Left 05/02/2018   Procedure: ELECTROMAGNETIC NAVIGATION BRONCHOSCOPY;  Surgeon: Flora Lipps, MD;  Location:  [facility] ORS;  Service: Cardiopulmonary;  Laterality: Left;  . ESOPHAGOGASTRODUODENOSCOPY    . IR IMAGING GUIDED PORT INSERTION  05/14/2018  . KNEE SURGERY Left 2016    FAMILY HISTORY: Family History  Problem Relation Age of Onset  . Hypertension Mother   . Heart disease Father     ADVANCED DIRECTIVES (Y/N):  N  HEALTH MAINTENANCE: Social History   Tobacco Use  . Smoking status: Never Smoker  . Smokeless tobacco: Never Used  Substance Use Topics  . Alcohol use: Yes    Alcohol/week: 2.0 standard drinks    Types: 2 Glasses of wine per week  . Drug use: No     Colonoscopy:  PAP:  Bone density:  Lipid panel:  Allergies  Allergen Reactions  . Penicillins Anaphylaxis  . Other     allergic to cats  . Sulfur Nausea And Vomiting    Dizzy  . Tetanus Toxoids Swelling    Fever and swelling  . Gluten Meal Rash    Sensitive to gluten    Current Outpatient Medications  Medication Sig Dispense Refill  . albuterol (PROVENTIL HFA;VENTOLIN HFA) 108 (90 BASE) MCG/ACT inhaler Inhale into the lungs every 6 (six) hours as needed for wheezing or shortness of breath.    Marland Kitchen amitriptyline (ELAVIL) 25 MG tablet Take 25 mg by mouth at bedtime.   1  . azelastine (ASTELIN) 0.1 % nasal spray Place 2 sprays into both nostrils 2 (two) times daily. Use in each nostril as directed     . Bepotastine Besilate (BEPREVE) 1.5 % SOLN Apply 1 drop to eye 2 (two) times daily.     . budesonide (PULMICORT) 0.5  MG/2ML nebulizer solution USE 2 MLS (0.5 MG TOTAL) BY NEBULIZATION 2 (TWO) TIMES DAILY. NASAL RINSE    . dicyclomine (BENTYL) 20 MG tablet Take 20 mg by mouth 4 (four) times daily as needed for spasms.   1  . fluticasone furoate-vilanterol (BREO ELLIPTA) 100-25 MCG/INH AEPB Inhale 1 puff into the lungs daily.     . hydrOXYzine (VISTARIL) 25 MG capsule Take 1 capsule by mouth.    . levocetirizine (XYZAL) 5 MG tablet Take 5 mg by mouth daily.    Marland Kitchen lidocaine-prilocaine (EMLA) cream Apply to affected area  once 30 g 3  . Multiple Vitamin (MULTIVITAMIN WITH MINERALS) TABS tablet Take 1 tablet by mouth daily.    Marland Kitchen omeprazole (PRILOSEC) 20 MG capsule Take 20 mg by mouth daily.    . ondansetron (ZOFRAN) 8 MG tablet Take 1 tablet (8 mg total) by mouth 2 (two) times daily as needed. 30 tablet 2  . prochlorperazine (COMPAZINE) 10 MG tablet Take 1 tablet (10 mg total) by mouth every 6 (six) hours as needed (Nausea or vomiting). 60 tablet 2  . sucralfate (CARAFATE) 1 g tablet Take 1 tablet (1 g total) by mouth 3 (three) times daily. Dissolve in 3-4 tbsp warm water, swish and swallow. 90 tablet 3   No current facility-administered medications for this visit.     OBJECTIVE: There were no vitals filed for this visit.   There is no height or weight on file to calculate BMI.    ECOG FS:0 - Asymptomatic  General: Well-developed, well-nourished, no acute distress. Eyes: Pink conjunctiva, anicteric sclera. HEENT: Normocephalic, moist mucous membranes, clear oropharnyx. Lungs: Clear to auscultation bilaterally. Heart: Regular rate and rhythm. No rubs, murmurs, or gallops. Abdomen: Soft, nontender, nondistended. No organomegaly noted, normoactive bowel sounds. Musculoskeletal: No edema, cyanosis, or clubbing. Neuro: Alert, answering all questions appropriately. Cranial nerves grossly intact. Skin: No rashes or petechiae noted. Psych: Normal affect. Lymphatics: No cervical, calvicular, axillary or inguinal LAD.  LAB RESULTS:  Lab Results  Component Value Date   NA 138 11/22/2018   K 4.4 11/22/2018   CL 109 11/22/2018   CO2 22 11/22/2018   GLUCOSE 104 (H) 11/22/2018   BUN 19 11/22/2018   CREATININE 0.76 11/22/2018   CALCIUM 9.1 11/22/2018   PROT 7.5 11/22/2018   ALBUMIN 3.9 11/22/2018   AST 36 11/22/2018   ALT 57 (H) 11/22/2018   ALKPHOS 87 11/22/2018   BILITOT 0.5 11/22/2018   GFRNONAA >60 11/22/2018   GFRAA >60 11/22/2018    Lab Results  Component Value Date   WBC 5.5 11/22/2018    NEUTROABS 4.2 11/22/2018   HGB 12.1 11/22/2018   HCT 37.7 11/22/2018   MCV 88.1 11/22/2018   PLT 249 11/22/2018     STUDIES: No results found.  ASSESSMENT: Stage II Hodgkin's lymphoma.  PLAN:    1.  Stage II Hodgkin's lymphoma: PET scan results from September 06, 2018 reviewed independently with further decrease in size of anterior mediastinal mass as well as decreased metabolic activity.  (Deauville criteria 3).  No other evidence of significant hypermetabolic activity.  Patient completed chemotherapy with ABVD on August 27, 2018.  She completed XRT to her mediastinal mass at the end of May 2020.  No further intervention is needed at this time.  Return to clinic in 2 months with repeat PET scan and further evaluation.  If PET scan continues to be negative, can transition to imaging with CT scans.    2.  Neutropenia: Resolved. 3.  Anemia: Resolved.  I spent a total of 30 minutes face-to-face with the patient of which greater than 50% of the visit was spent in counseling and coordination of care as detailed above.   Patient expressed understanding and was in agreement with this plan. She also understands that She can call clinic at any time with any questions, concerns, or complaints.   Cancer Staging Hodgkin's lymphoma [facility]) Staging form: Hodgkin and Non-Hodgkin Lymphoma, AJCC 8th Edition - Clinical stage from 05/13/2018: Stage II bulky (Hodgkin lymphoma, A - Asymptomatic) - Signed by Lloyd Huger, MD on 05/13/2018   Lloyd Huger, MD   11/24/2018 7:48 AM

## 2018-11-21 ENCOUNTER — Other Ambulatory Visit: Payer: Self-pay

## 2018-11-22 ENCOUNTER — Inpatient Hospital Stay (HOSPITAL_BASED_OUTPATIENT_CLINIC_OR_DEPARTMENT_OTHER): Payer: Commercial Managed Care - PPO | Admitting: Oncology

## 2018-11-22 ENCOUNTER — Other Ambulatory Visit: Payer: Self-pay

## 2018-11-22 ENCOUNTER — Encounter: Payer: Self-pay | Admitting: Radiation Oncology

## 2018-11-22 ENCOUNTER — Encounter: Payer: Self-pay | Admitting: Oncology

## 2018-11-22 ENCOUNTER — Ambulatory Visit
Admission: RE | Admit: 2018-11-22 | Discharge: 2018-11-22 | Disposition: A | Discharge status: Home / Self Care | Payer: Commercial Managed Care - PPO | Source: Ambulatory Visit | Attending: Radiation Oncology | Admitting: Radiation Oncology

## 2018-11-22 ENCOUNTER — Inpatient Hospital Stay: Payer: Commercial Managed Care - PPO | Attending: Oncology

## 2018-11-22 VITALS — Temp 96.2°F | Resp 18 | Wt 235.0 lb

## 2018-11-22 DIAGNOSIS — Z79899 Other long term (current) drug therapy: Secondary | ICD-10-CM | POA: Diagnosis not present

## 2018-11-22 DIAGNOSIS — Z923 Personal history of irradiation: Secondary | ICD-10-CM | POA: Insufficient documentation

## 2018-11-22 DIAGNOSIS — C8192 Hodgkin lymphoma, unspecified, intrathoracic lymph nodes: Secondary | ICD-10-CM

## 2018-11-22 DIAGNOSIS — C8112 Nodular sclerosis classical Hodgkin lymphoma, intrathoracic lymph nodes: Secondary | ICD-10-CM | POA: Diagnosis present

## 2018-11-22 DIAGNOSIS — Z9221 Personal history of antineoplastic chemotherapy: Secondary | ICD-10-CM | POA: Insufficient documentation

## 2018-11-22 LAB — CBC WITH DIFFERENTIAL/PLATELET
Abs Immature Granulocytes: 0.01 10*3/uL (ref 0.00–0.07)
Basophils Absolute: 0 10*3/uL (ref 0.0–0.1)
Basophils Relative: 0 %
Eosinophils Absolute: 0.2 10*3/uL (ref 0.0–0.5)
Eosinophils Relative: 3 %
HCT: 37.7 % (ref 36.0–46.0)
Hemoglobin: 12.1 g/dL (ref 12.0–15.0)
Immature Granulocytes: 0 %
Lymphocytes Relative: 13 %
Lymphs Abs: 0.7 10*3/uL (ref 0.7–4.0)
MCH: 28.3 pg (ref 26.0–34.0)
MCHC: 32.1 g/dL (ref 30.0–36.0)
MCV: 88.1 fL (ref 80.0–100.0)
Monocytes Absolute: 0.4 10*3/uL (ref 0.1–1.0)
Monocytes Relative: 8 %
Neutro Abs: 4.2 10*3/uL (ref 1.7–7.7)
Neutrophils Relative %: 76 %
Platelets: 249 10*3/uL (ref 150–400)
RBC: 4.28 MIL/uL (ref 3.87–5.11)
RDW: 14.3 % (ref 11.5–15.5)
WBC: 5.5 10*3/uL (ref 4.0–10.5)
nRBC: 0 % (ref 0.0–0.2)

## 2018-11-22 LAB — COMPREHENSIVE METABOLIC PANEL
ALT: 57 U/L — ABNORMAL HIGH (ref 0–44)
AST: 36 U/L (ref 15–41)
Albumin: 3.9 g/dL (ref 3.5–5.0)
Alkaline Phosphatase: 87 U/L (ref 38–126)
Anion gap: 7 (ref 5–15)
BUN: 19 mg/dL (ref 6–20)
CO2: 22 mmol/L (ref 22–32)
Calcium: 9.1 mg/dL (ref 8.9–10.3)
Chloride: 109 mmol/L (ref 98–111)
Creatinine, Ser: 0.76 mg/dL (ref 0.44–1.00)
GFR calc Af Amer: >60 mL/min (ref >60)
GFR calc non Af Amer: >60 mL/min (ref >60)
Glucose, Bld: 104 mg/dL — ABNORMAL HIGH (ref 70–99)
Potassium: 4.4 mmol/L (ref 3.5–5.1)
Sodium: 138 mmol/L (ref 135–145)
Total Bilirubin: 0.5 mg/dL (ref 0.3–1.2)
Total Protein: 7.5 g/dL (ref 6.5–8.1)

## 2018-11-22 NOTE — Progress Notes (Signed)
Patient reports increased fatigue, denies other concerns.

## 2018-11-22 NOTE — Progress Notes (Signed)
Radiation Oncology Follow up Note  Name: Misty Combs   Date:   11/22/2018 MRN:  [id number] DOB: 04/24/1986    This 33 y.o. female presents to the clinic today for 1 month follow-up status post involved field radiation therapy for stage II classic Hodgkin's lymphoma.  REFERRING PROVIDER: Gala Lewandowsky, MD  HPI: Patient is a 33 year old female now at 1 month having received involved field radiation therapy to her chest for involved field stage II classic Hodgkin's disease status post ABVD chemotherapy.  Seen today in routine follow-up she is doing well.  She is having no dysphasia cough or chest tightness.  She is returned to work and is doing well..  COMPLICATIONS OF TREATMENT: none  FOLLOW UP COMPLIANCE: keeps appointments   PHYSICAL EXAM:  Temp (!) 96.2 F (35.7 C)   Resp 18   Wt 235 lb 0.2 oz (106.6 kg)   BMI 39.11 kg/m  Well-developed well-nourished patient in NAD. HEENT reveals PERLA, EOMI, discs not visualized.  Oral cavity is clear. No oral mucosal lesions are identified. Neck is clear without evidence of cervical or supraclavicular adenopathy. Lungs are clear to A&P. Cardiac examination is essentially unremarkable with regular rate and rhythm without murmur rub or thrill. Abdomen is benign with no organomegaly or masses noted. Motor sensory and DTR levels are equal and symmetric in the upper and lower extremities. Cranial nerves II through XII are grossly intact. Proprioception is intact. No peripheral adenopathy or edema is identified. No motor or sensory levels are noted. Crude visual fields are within normal range.  RADIOLOGY RESULTS: No current films for review  PLAN: Present time she is recovering well with no side effects from her involved field radiation therapy.  She is seeing medical oncology today have asked her to copy me any follow-up scans in the future.  I have asked to see her back in 4 to 5 months for follow-up.  Patient knows to call at anytime for  any concerns.  I would like to take this opportunity to thank you for allowing me to participate in the care of your patient.Noreene Filbert, MD

## 2018-11-23 LAB — THYROID PANEL WITH TSH
Free Thyroxine Index: 1.8 (ref 1.2–4.9)
T3 Uptake Ratio: 22 % — ABNORMAL LOW (ref 24–39)
T4, Total: 8.1 ug/dL (ref 4.5–12.0)
TSH: 1.56 u[IU]/mL (ref 0.450–4.500)

## 2018-12-05 ENCOUNTER — Inpatient Hospital Stay: Payer: Commercial Managed Care - PPO

## 2018-12-05 ENCOUNTER — Other Ambulatory Visit: Payer: Self-pay

## 2018-12-05 DIAGNOSIS — Z95828 Presence of other vascular implants and grafts: Secondary | ICD-10-CM

## 2018-12-05 DIAGNOSIS — C8192 Hodgkin lymphoma, unspecified, intrathoracic lymph nodes: Secondary | ICD-10-CM | POA: Diagnosis not present

## 2018-12-05 MED ORDER — HEPARIN SOD (PORK) LOCK FLUSH 100 UNIT/ML IV SOLN
500.0000 [IU] | Freq: Once | INTRAVENOUS | Status: AC
  Administered 2018-12-05: 12:00:00 500 [IU] via INTRAVENOUS

## 2018-12-05 MED ORDER — SODIUM CHLORIDE 0.9% FLUSH
10.0000 mL | Freq: Once | INTRAVENOUS | Status: AC
  Administered 2018-12-05: 10 mL via INTRAVENOUS
  Filled 2018-12-05: qty 10

## 2019-01-17 ENCOUNTER — Ambulatory Visit
Admission: RE | Admit: 2019-01-17 | Discharge: 2019-01-17 | Disposition: A | Discharge status: Home / Self Care | Payer: Commercial Managed Care - PPO | Source: Ambulatory Visit | Attending: Oncology | Admitting: Oncology

## 2019-01-17 ENCOUNTER — Other Ambulatory Visit: Payer: Self-pay

## 2019-01-17 DIAGNOSIS — C8112 Nodular sclerosis classical Hodgkin lymphoma, intrathoracic lymph nodes: Secondary | ICD-10-CM

## 2019-01-17 LAB — GLUCOSE, CAPILLARY: Glucose-Capillary: 97 mg/dL (ref 70–99)

## 2019-01-17 MED ORDER — FLUDEOXYGLUCOSE F - 18 (FDG) INJECTION
12.2000 | Freq: Once | INTRAVENOUS | Status: AC | PRN
  Administered 2019-01-17: 12.69 via INTRAVENOUS

## 2019-01-19 NOTE — Progress Notes (Signed)
[place]  Telephone:(336) [phone] Fax:(336) [phone]  ID: [name] OB: 1985/07/02  MR#: [id number]  DT:[id number]  Patient Care Team: Gala Lewandowsky, MD as PCP - General (Family Medicine)  CHIEF COMPLAINT: Stage II Hodgkin's lymphoma.  INTERVAL HISTORY: Patient returns to clinic today from further evaluation and discussion of her imaging results.  She continues to feel well and remains asymptomatic. She has no neurologic complaints.  She denies any fevers, chills, night sweats, or weight loss.  She denies any chest pain, shortness of breath, cough, or hemoptysis.  She denies any nausea, vomiting, constipation, or diarrhea.  She has no urinary complaints.  Patient feels at her baseline offers no specific complaints today.    REVIEW OF SYSTEMS:   Review of Systems  Constitutional: Negative.  Negative for fever, malaise/fatigue and weight loss.  Respiratory: Negative.  Negative for cough and shortness of breath.   Cardiovascular: Negative.  Negative for chest pain and leg swelling.  Gastrointestinal: Negative.  Negative for abdominal pain, blood in stool and melena.  Genitourinary: Negative.  Negative for dysuria and flank pain.  Musculoskeletal: Negative.  Negative for back pain.  Skin: Negative.  Negative for itching and rash.  Neurological: Negative.  Negative for focal weakness, weakness and headaches.  Psychiatric/Behavioral: Negative.  The patient is not nervous/anxious.     As per HPI. Otherwise, a complete review of systems is negative.  PAST MEDICAL HISTORY: Past Medical History:  Diagnosis Date  . Asthma    exercise induced  . Migraines    no aura  . UTI (lower urinary tract infection)    post coital    PAST SURGICAL HISTORY: Past Surgical History:  Procedure Laterality Date  . ELECTROMAGNETIC NAVIGATION BROCHOSCOPY Left 05/02/2018   Procedure: ELECTROMAGNETIC NAVIGATION BRONCHOSCOPY;  Surgeon: Flora Lipps, MD;  Location:  [facility] ORS;  Service: Cardiopulmonary;  Laterality: Left;  . ESOPHAGOGASTRODUODENOSCOPY    . IR IMAGING GUIDED PORT INSERTION  05/14/2018  . KNEE SURGERY Left 2016    FAMILY HISTORY: Family History  Problem Relation Age of Onset  . Hypertension Mother   . Heart disease Father     ADVANCED DIRECTIVES (Y/N):  N  HEALTH MAINTENANCE: Social History   Tobacco Use  . Smoking status: Never Smoker  . Smokeless tobacco: Never Used  Substance Use Topics  . Alcohol use: Yes    Alcohol/week: 2.0 standard drinks    Types: 2 Glasses of wine per week  . Drug use: No     Colonoscopy:  PAP:  Bone density:  Lipid panel:  Allergies  Allergen Reactions  . Penicillins Anaphylaxis  . Other     allergic to cats  . Sulfur Nausea And Vomiting    Dizzy  . Tetanus Toxoids Swelling    Fever and swelling  . Gluten Meal Rash    Sensitive to gluten    Current Outpatient Medications  Medication Sig Dispense Refill  . albuterol (PROVENTIL HFA;VENTOLIN HFA) 108 (90 BASE) MCG/ACT inhaler Inhale into the lungs every 6 (six) hours as needed for wheezing or shortness of breath.    . ALPRAZolam (XANAX) 0.25 MG tablet Take 1 tablet by mouth 2 (two) times daily as needed.    Marland Kitchen amitriptyline (ELAVIL) 25 MG tablet Take 25 mg by mouth at bedtime.   1  . azelastine (ASTELIN) 0.1 % nasal spray Place 2 sprays into both nostrils 2 (two) times daily. Use in each nostril as directed     . Bepotastine Besilate (BEPREVE) 1.5 %  SOLN Apply 1 drop to eye 2 (two) times daily.     . budesonide (PULMICORT) 0.5 MG/2ML nebulizer solution USE 2 MLS (0.5 MG TOTAL) BY NEBULIZATION 2 (TWO) TIMES DAILY. NASAL RINSE    . dicyclomine (BENTYL) 20 MG tablet Take 20 mg by mouth 4 (four) times daily as needed for spasms.   1  . fluticasone furoate-vilanterol (BREO ELLIPTA) 100-25 MCG/INH AEPB Inhale 1 puff into the lungs daily.     Marland Kitchen levocetirizine (XYZAL) 5 MG tablet Take 5 mg by mouth daily.    Marland Kitchen lidocaine-prilocaine (EMLA) cream  Apply to affected area once 30 g 3  . Multiple Vitamin (MULTIVITAMIN WITH MINERALS) TABS tablet Take 1 tablet by mouth daily.    . mupirocin ointment (BACTROBAN) 2 % Apply 1 application topically.    Marland Kitchen omeprazole (PRILOSEC) 20 MG capsule Take 20 mg by mouth daily.    . hydrOXYzine (VISTARIL) 25 MG capsule Take 1 capsule by mouth.     No current facility-administered medications for this visit.     OBJECTIVE: Vitals:   01/24/19 0958  BP: 130/89  Pulse: 87  Resp: 18  Temp: 97.6 F (36.4 C)     Body mass index is 39.32 kg/m.    ECOG FS:0 - Asymptomatic  General: Well-developed, well-nourished, no acute distress. Eyes: Pink conjunctiva, anicteric sclera. HEENT: Normocephalic, moist mucous membranes. Lungs: Clear to auscultation bilaterally. Heart: Regular rate and rhythm. No rubs, murmurs, or gallops. Abdomen: Soft, nontender, nondistended. No organomegaly noted, normoactive bowel sounds. Musculoskeletal: No edema, cyanosis, or clubbing. Neuro: Alert, answering all questions appropriately. Cranial nerves grossly intact. Skin: No rashes or petechiae noted. Psych: Normal affect.  LAB RESULTS:  Lab Results  Component Value Date   NA 138 01/24/2019   K 4.0 01/24/2019   CL 110 01/24/2019   CO2 20 (L) 01/24/2019   GLUCOSE 167 (H) 01/24/2019   BUN 20 01/24/2019   CREATININE 0.64 01/24/2019   CALCIUM 8.8 (L) 01/24/2019   PROT 7.3 01/24/2019   ALBUMIN 3.8 01/24/2019   AST 22 01/24/2019   ALT 28 01/24/2019   ALKPHOS 84 01/24/2019   BILITOT 0.5 01/24/2019   GFRNONAA >60 01/24/2019   GFRAA >60 01/24/2019    Lab Results  Component Value Date   WBC 5.6 01/24/2019   NEUTROABS 4.3 01/24/2019   HGB 12.3 01/24/2019   HCT 38.1 01/24/2019   MCV 89.0 01/24/2019   PLT 253 01/24/2019     STUDIES: Nm Pet Image Restag (ps) Skull Base To Thigh  Result Date: 01/17/2019 CLINICAL DATA:  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 EXAM: NUCLEAR MEDICINE  PET SKULL BASE TO THIGH TECHNIQUE: 12.7 mCi F-18 FDG was injected intravenously. Full-ring PET imaging was performed from the skull base to thigh after the radiotracer. CT data was obtained and used for attenuation correction and anatomic localization. Fasting blood glucose: 97 mg/dl COMPARISON:  09/06/2018 FINDINGS: Mediastinal blood pool activity: SUV max 3.7 Liver activity: SUV max 4.2 NECK: Along the skin fold at the base of the skull there some mild cutaneous nodularity/skin thickening with mild hypermetabolic activity, maximum SUV 5.0, previously 3.4. Incidental CT findings: none CHEST: The previous anterior mediastinal mass is nearly completely resolved, maximum SUV along the indistinct residual tissue 3.9 (Deauville 3) and just above blood pool activity. No current hypermetabolic or pathologic adenopathy in the chest. Small right axillary cutaneous nodule shown on image 54/3 with maximum SUV 2.9 (Deauville 2, previously 1.4 (Deauville 2). Incidental CT findings: Right Port-A-Cath tip:  Right atrium. Trace pericardial fluid posteriorly. Bandlike linear subsegmental atelectasis in the left upper lobe and left lower lobe. ABDOMEN/PELVIS: No significant abnormal hypermetabolic activity in this region. The previous small subcutaneous nodule along the right lower anterior abdominal wall is no longer appreciable. Incidental CT findings: none SKELETON: No significant abnormal hypermetabolic activity in this region. Incidental CT findings: none IMPRESSION: 1. Further reduction in size of the previous anterior mediastinal mass which currently is indistinct and bandlike. Maximum SUV remains Deauville 3, just above blood pool activity. 2. Small cutaneous nodules are again observed along the occipital crease posteriorly and along the right axilla. These are nonspecific and could be inflammatory, although small foci of cutaneous lymphoma are not readily excluded. Electronically Signed   By: Van Clines M.D.   On:  01/17/2019 15:43    ASSESSMENT: Stage II Hodgkin's lymphoma.  PLAN:    1.  Stage II Hodgkin's lymphoma: PET scan results from January 17, 2019 reviewed independently and reported as above with further reduction in size of anterior mediastinal mass and no evidence of recurrent or progressive disease.  (Deauville criteria 3). Patient completed chemotherapy with ABVD on August 27, 2018.  She completed XRT to her mediastinal mass at the end of May 2020.  No further interventions are needed at this time.  Patient can now transition to CT scan every 6 months. Return to clinic 1 to 2 days after her next imaging for further evaluation.    I spent a total of 20 minutes face-to-face with the patient of which greater than 50% of the visit was spent in counseling and coordination of care as detailed above.   Patient expressed understanding and was in agreement with this plan. She also understands that She can call clinic at any time with any questions, concerns, or complaints.   Cancer Staging Hodgkin's lymphoma [facility]) Staging form: Hodgkin and Non-Hodgkin Lymphoma, AJCC 8th Edition - Clinical stage from 05/13/2018: Stage II bulky (Hodgkin lymphoma, A - Asymptomatic) - Signed by Lloyd Huger, MD on 05/13/2018   Lloyd Huger, MD   01/25/2019 6:28 AM

## 2019-01-23 ENCOUNTER — Other Ambulatory Visit: Payer: Self-pay

## 2019-01-23 ENCOUNTER — Encounter: Payer: Self-pay | Admitting: Oncology

## 2019-01-23 NOTE — Progress Notes (Signed)
Patient stated that she had been doing well with no complaints. 

## 2019-01-24 ENCOUNTER — Inpatient Hospital Stay: Payer: Commercial Managed Care - PPO | Attending: Oncology

## 2019-01-24 ENCOUNTER — Other Ambulatory Visit: Payer: Self-pay

## 2019-01-24 ENCOUNTER — Inpatient Hospital Stay (HOSPITAL_BASED_OUTPATIENT_CLINIC_OR_DEPARTMENT_OTHER): Payer: Commercial Managed Care - PPO | Admitting: Oncology

## 2019-01-24 VITALS — BP 130/89 | HR 87 | Temp 97.6°F | Resp 18 | Wt 236.3 lb

## 2019-01-24 DIAGNOSIS — Z923 Personal history of irradiation: Secondary | ICD-10-CM | POA: Insufficient documentation

## 2019-01-24 DIAGNOSIS — C8190 Hodgkin lymphoma, unspecified, unspecified site: Secondary | ICD-10-CM | POA: Diagnosis present

## 2019-01-24 DIAGNOSIS — C8112 Nodular sclerosis classical Hodgkin lymphoma, intrathoracic lymph nodes: Secondary | ICD-10-CM

## 2019-01-24 DIAGNOSIS — Z79899 Other long term (current) drug therapy: Secondary | ICD-10-CM | POA: Diagnosis not present

## 2019-01-24 DIAGNOSIS — Z95828 Presence of other vascular implants and grafts: Secondary | ICD-10-CM

## 2019-01-24 LAB — COMPREHENSIVE METABOLIC PANEL
ALT: 28 U/L (ref 0–44)
AST: 22 U/L (ref 15–41)
Albumin: 3.8 g/dL (ref 3.5–5.0)
Alkaline Phosphatase: 84 U/L (ref 38–126)
Anion gap: 8 (ref 5–15)
BUN: 20 mg/dL (ref 6–20)
CO2: 20 mmol/L — ABNORMAL LOW (ref 22–32)
Calcium: 8.8 mg/dL — ABNORMAL LOW (ref 8.9–10.3)
Chloride: 110 mmol/L (ref 98–111)
Creatinine, Ser: 0.64 mg/dL (ref 0.44–1.00)
GFR calc Af Amer: >60 mL/min (ref >60)
GFR calc non Af Amer: >60 mL/min (ref >60)
Glucose, Bld: 167 mg/dL — ABNORMAL HIGH (ref 70–99)
Potassium: 4 mmol/L (ref 3.5–5.1)
Sodium: 138 mmol/L (ref 135–145)
Total Bilirubin: 0.5 mg/dL (ref 0.3–1.2)
Total Protein: 7.3 g/dL (ref 6.5–8.1)

## 2019-01-24 LAB — CBC WITH DIFFERENTIAL/PLATELET
Abs Immature Granulocytes: 0.02 10*3/uL (ref 0.00–0.07)
Basophils Absolute: 0 10*3/uL (ref 0.0–0.1)
Basophils Relative: 0 %
Eosinophils Absolute: 0.2 10*3/uL (ref 0.0–0.5)
Eosinophils Relative: 3 %
HCT: 38.1 % (ref 36.0–46.0)
Hemoglobin: 12.3 g/dL (ref 12.0–15.0)
Immature Granulocytes: 0 %
Lymphocytes Relative: 14 %
Lymphs Abs: 0.8 10*3/uL (ref 0.7–4.0)
MCH: 28.7 pg (ref 26.0–34.0)
MCHC: 32.3 g/dL (ref 30.0–36.0)
MCV: 89 fL (ref 80.0–100.0)
Monocytes Absolute: 0.3 10*3/uL (ref 0.1–1.0)
Monocytes Relative: 5 %
Neutro Abs: 4.3 10*3/uL (ref 1.7–7.7)
Neutrophils Relative %: 78 %
Platelets: 253 10*3/uL (ref 150–400)
RBC: 4.28 MIL/uL (ref 3.87–5.11)
RDW: 14 % (ref 11.5–15.5)
WBC: 5.6 10*3/uL (ref 4.0–10.5)
nRBC: 0 % (ref 0.0–0.2)

## 2019-01-24 MED ORDER — SODIUM CHLORIDE 0.9% FLUSH
10.0000 mL | Freq: Once | INTRAVENOUS | Status: AC
  Administered 2019-01-24: 10:00:00 10 mL via INTRAVENOUS
  Filled 2019-01-24: qty 10

## 2019-01-24 MED ORDER — HEPARIN SOD (PORK) LOCK FLUSH 100 UNIT/ML IV SOLN
500.0000 [IU] | Freq: Once | INTRAVENOUS | Status: AC
  Administered 2019-01-24: 10:00:00 500 [IU] via INTRAVENOUS

## 2019-03-15 ENCOUNTER — Telehealth: Payer: Self-pay

## 2019-03-15 NOTE — Telephone Encounter (Signed)
Telephone call to patient to discuss SCP visit.  Patient in agreement for me to mail SCP and treatment summary and I will call her Wednesday Nov. 4th at 2:00 to review information and talk about post cancer care.  Packet mailed.

## 2019-03-20 ENCOUNTER — Other Ambulatory Visit: Payer: Self-pay

## 2019-03-21 ENCOUNTER — Other Ambulatory Visit: Payer: Self-pay

## 2019-03-21 ENCOUNTER — Inpatient Hospital Stay: Payer: Commercial Managed Care - PPO | Attending: Oncology

## 2019-03-21 DIAGNOSIS — C8192 Hodgkin lymphoma, unspecified, intrathoracic lymph nodes: Secondary | ICD-10-CM | POA: Insufficient documentation

## 2019-03-21 DIAGNOSIS — Z452 Encounter for adjustment and management of vascular access device: Secondary | ICD-10-CM | POA: Diagnosis present

## 2019-03-21 DIAGNOSIS — Z95828 Presence of other vascular implants and grafts: Secondary | ICD-10-CM

## 2019-03-21 MED ORDER — SODIUM CHLORIDE 0.9% FLUSH
10.0000 mL | Freq: Once | INTRAVENOUS | Status: AC
  Administered 2019-03-21: 15:00:00 10 mL via INTRAVENOUS
  Filled 2019-03-21: qty 10

## 2019-03-21 MED ORDER — HEPARIN SOD (PORK) LOCK FLUSH 100 UNIT/ML IV SOLN
500.0000 [IU] | Freq: Once | INTRAVENOUS | Status: AC
  Administered 2019-03-21: 500 [IU] via INTRAVENOUS

## 2019-03-26 ENCOUNTER — Other Ambulatory Visit: Payer: Self-pay | Admitting: Emergency Medicine

## 2019-03-26 ENCOUNTER — Encounter: Payer: Self-pay | Admitting: Oncology

## 2019-03-26 DIAGNOSIS — Z95828 Presence of other vascular implants and grafts: Secondary | ICD-10-CM

## 2019-03-27 ENCOUNTER — Inpatient Hospital Stay: Payer: Commercial Managed Care - PPO | Attending: Oncology | Admitting: Oncology

## 2019-03-27 NOTE — Progress Notes (Unsigned)
Survivorship Care Plan visit completed.  Treatment summary reviewed and previously mailed to patient.  ASCO answers booklet reviewed and given to patient.  CARE program and Cancer Transitions discussed with patient along with other resources cancer center offers to patients and caregivers.  Patient verbalized understanding.  Patient states she would like to participate in Cancer Transitions in January.

## 2019-04-01 NOTE — Progress Notes (Unsigned)
Misty Kitchen   CLINIC:  Survivorship   REASON FOR VISIT:  Routine follow-up for history of Stage II hodgkins lymphoma  BRIEF ONCOLOGIC HISTORY:  Oncology History  Hodgkin's lymphoma ([place])  05/13/2018 Initial Diagnosis   Hodgkin's lymphoma ([place])   05/13/2018 Cancer Staging   Staging form: Hodgkin and Non-Hodgkin Lymphoma, AJCC 8th Edition - Clinical stage from 05/13/2018: Stage II bulky (Hodgkin lymphoma, A - Asymptomatic) - Signed by Lloyd Huger, MD on 05/13/2018   05/21/2018 -  Chemotherapy   The patient had DOXOrubicin (ADRIAMYCIN) chemo injection 52 mg, 25 mg/m2 = 52 mg, Intravenous,  Once, 4 of 6 cycles Administration: 52 mg (05/21/2018), 52 mg (06/04/2018), 52 mg (06/18/2018), 52 mg (07/02/2018), 52 mg (07/16/2018), 52 mg (07/30/2018), 52 mg (08/13/2018), 52 mg (08/27/2018) palonosetron (ALOXI) injection 0.25 mg, 0.25 mg, Intravenous,  Once, 4 of 6 cycles Administration: 0.25 mg (05/21/2018), 0.25 mg (06/04/2018), 0.25 mg (06/18/2018), 0.25 mg (07/02/2018), 0.25 mg (07/16/2018), 0.25 mg (07/30/2018), 0.25 mg (08/13/2018), 0.25 mg (08/27/2018) pegfilgrastim (NEULASTA) injection 6 mg, 6 mg, Subcutaneous, Once, 3 of 3 cycles Administration: 6 mg (06/06/2018), 6 mg (07/04/2018), 6 mg (07/18/2018) pegfilgrastim (NEULASTA ONPRO KIT) injection 6 mg, 6 mg, Subcutaneous, Once, 1 of 3 cycles Administration: 6 mg (08/13/2018) bleomycin (BLEOCIN) 21 Units in sodium chloride 0.9 % 50 mL chemo infusion, 10 Units/m2 = 21 Units, Intravenous,  Once, 4 of 6 cycles Administration: 21 Units (05/21/2018), 21 Units (06/04/2018), 21 Units (06/18/2018), 21 Units (07/02/2018), 21 Units (07/16/2018), 21 Units (07/30/2018), 21 Units (08/13/2018), 21 Units (08/27/2018) dacarbazine (DTIC) 780 mg in sodium chloride 0.9 % 250 mL chemo infusion, 375 mg/m2 = 780 mg, Intravenous,  Once, 4 of 6 cycles Administration: 780 mg (05/21/2018), 780 mg (06/04/2018), 780 mg (06/18/2018), 780 mg (07/02/2018), 780 mg (07/16/2018), 780 mg (07/30/2018), 780 mg  (08/13/2018), 780 mg (08/27/2018) vinBLAStine (VELBAN) 12.5 mg in sodium chloride 0.9 % 50 mL chemo infusion, 6 mg/m2 = 12.5 mg, Intravenous, Once, 4 of 6 cycles Administration: 12.5 mg (05/21/2018), 12.5 mg (06/04/2018), 12.5 mg (06/18/2018), 12.5 mg (07/02/2018), 12.5 mg (07/16/2018), 12.5 mg (07/30/2018), 12.5 mg (08/13/2018), 12.5 mg (08/27/2018) fosaprepitant (EMEND) 150 mg, dexamethasone (DECADRON) 12 mg in sodium chloride 0.9 % 145 mL IVPB, , Intravenous,  Once, 4 of 6 cycles Administration:  (05/21/2018),  (06/04/2018),  (06/18/2018),  (07/02/2018),  (07/16/2018),  (07/30/2018),  (08/13/2018),  (08/27/2018)  for chemotherapy treatment.       INTERVAL HISTORY:  [name] presents to the Survivorship Clinic today for routine follow-up for her history of stage II Hodgkin's lymphoma.  Overall, she reports feeling quite well.   She was last evaluated by Dr. Grayland Ormond on 01/24/2019 to discuss recent PET scan.  PET scan revealed a further reduction in size of anterior mediastinal mass and no evidence of recurrent or progressive disease.  She completed chemotherapy with ABVD on 08/27/2018.  She completed XRT to mediastinal mass at the end of May 20.  No further interventions are needed at this time.  It was recommended she transition to CT scans every 6 months with assessment to follow.  REVIEW OF SYSTEMS:  Review of Systems  Constitutional: Negative for appetite change, fatigue, fever and unexpected weight change.  HENT:   Negative for nosebleeds, sore throat and trouble swallowing.   Eyes: Negative.   Respiratory: Negative.  Negative for cough, shortness of breath and wheezing.   Cardiovascular: Negative.  Negative for chest pain and leg swelling.  Gastrointestinal: Negative for abdominal pain, blood in stool, constipation, diarrhea, nausea  and vomiting.  Endocrine: Negative.   Genitourinary: Negative.  Negative for bladder incontinence, hematuria and nocturia.   Musculoskeletal: Negative.  Negative for back pain  and flank pain.  Skin: Negative.   Neurological: Negative.  Negative for dizziness, headaches, light-headedness and numbness.  Hematological: Negative.   Psychiatric/Behavioral: Negative.  Negative for confusion. The patient is not nervous/anxious.     PAST MEDICAL/SURGICAL HISTORY:  Past Medical History:  Diagnosis Date  . Asthma    exercise induced  . Migraines    no aura  . UTI (lower urinary tract infection)    post coital   Past Surgical History:  Procedure Laterality Date  . ELECTROMAGNETIC NAVIGATION BROCHOSCOPY Left 05/02/2018   Procedure: ELECTROMAGNETIC NAVIGATION BRONCHOSCOPY;  Surgeon: Flora Lipps, MD;  Location: [facility] ORS;  Service: Cardiopulmonary;  Laterality: Left;  . ESOPHAGOGASTRODUODENOSCOPY    . IR IMAGING GUIDED PORT INSERTION  05/14/2018  . KNEE SURGERY Left 2016     ALLERGIES:  Allergies  Allergen Reactions  . Penicillins Anaphylaxis  . Other     allergic to cats  . Sulfur Nausea And Vomiting    Dizzy  . Tetanus Toxoids Swelling    Fever and swelling  . Gluten Meal Rash    Sensitive to gluten     CURRENT MEDICATIONS:  Outpatient Encounter Medications as of 03/27/2019  Medication Sig Note  . albuterol (PROVENTIL HFA;VENTOLIN HFA) 108 (90 BASE) MCG/ACT inhaler Inhale into the lungs every 6 (six) hours as needed for wheezing or shortness of breath.   . ALPRAZolam (XANAX) 0.25 MG tablet Take 1 tablet by mouth 2 (two) times daily as needed.   Misty Kitchen amitriptyline (ELAVIL) 25 MG tablet Take 25 mg by mouth at bedtime.    Misty Kitchen azelastine (ASTELIN) 0.1 % nasal spray Place 2 sprays into both nostrils 2 (two) times daily. Use in each nostril as directed    . Bepotastine Besilate (BEPREVE) 1.5 % SOLN Apply 1 drop to eye 2 (two) times daily.    . budesonide (PULMICORT) 0.5 MG/2ML nebulizer solution USE 2 MLS (0.5 MG TOTAL) BY NEBULIZATION 2 (TWO) TIMES DAILY. NASAL RINSE 04/22/2014: Received from: [facility]  . dicyclomine (BENTYL) 20 MG tablet  Take 20 mg by mouth 4 (four) times daily as needed for spasms.    . fluticasone furoate-vilanterol (BREO ELLIPTA) 100-25 MCG/INH AEPB Inhale 1 puff into the lungs daily.    . hydrOXYzine (VISTARIL) 25 MG capsule Take 1 capsule by mouth.   . levocetirizine (XYZAL) 5 MG tablet Take 5 mg by mouth daily.   Misty Kitchen lidocaine-prilocaine (EMLA) cream Apply to affected area once   . Multiple Vitamin (MULTIVITAMIN WITH MINERALS) TABS tablet Take 1 tablet by mouth daily.   . mupirocin ointment (BACTROBAN) 2 % Apply 1 application topically.   Misty Kitchen omeprazole (PRILOSEC) 20 MG capsule Take 20 mg by mouth daily.    No facility-administered encounter medications on file as of 03/27/2019.      ONCOLOGIC FAMILY HISTORY:  Family History  Problem Relation Age of Onset  . Hypertension Mother   . Heart disease Father     GENETIC COUNSELING/TESTING: None  SOCIAL HISTORY:  Misty Combs is /single/married/divorced/widowed/separated and lives alone/with her spouse/family/friend in (city), [place].  She has (#) children and they live in (city).  Misty Combs is currently retired/disabled/working part-time/full-time as ***.  She denies any current or history of tobacco, alcohol, or illicit drug use.     PHYSICAL EXAMINATION:  Vital Signs: There were no  vitals filed for this visit. There were no vitals filed for this visit.  Limited f/t telephone visit.   LABORATORY DATA:  Lab Results  Component Value Date   WBC 5.6 01/24/2019   HGB 12.3 01/24/2019   HCT 38.1 01/24/2019   MCV 89.0 01/24/2019   PLT 253 01/24/2019     Chemistry      Component Value Date/Time   NA 138 01/24/2019 0927   K 4.0 01/24/2019 0927   CL 110 01/24/2019 0927   CO2 20 (L) 01/24/2019 0927   BUN 20 01/24/2019 0927   CREATININE 0.64 01/24/2019 0927      Component Value Date/Time   CALCIUM 8.8 (L) 01/24/2019 0927   ALKPHOS 84 01/24/2019 0927   AST 22 01/24/2019 0927   ALT 28 01/24/2019 0927   BILITOT 0.5 01/24/2019  0927     DIAGNOSTIC IMAGING:  Pet 01/17/2019 . Further reduction in size of the previous anterior mediastinal mass which currently is indistinct and bandlike. Maximum SUV remains [place] 3, just above blood pool activity. 2. Small cutaneous nodules are again observed along the occipital crease posteriorly and along the right axilla. These are nonspecific and could be inflammatory, although small foci of cutaneous lymphoma are not readily excluded.  ASSESSMENT AND PLAN:  Misty Combs is a pleasant 33 y.o. female with history of Stage 2 Hodgkin's lymphoma.  Treatment to date consist of ABVD chemotherapy which was completed in April 2020.  She also received XRT to mediastinal mass at the end of May 2020. She presents to the Survivorship Clinic for surveillance and routine follow-up.   1. History of stage II Hodgkin's lymphoma:  Misty Combs is currently clinically and radiographically without evidence of disease or recurrence of breast cancer.   She will be due for mammogram in ***; orders placed today.  She will continue her anti-estrogen therapy with ***, with plans to continue for *** years.  She will return to the cancer center to see her medical oncologist, Dr. ***, in ***/2018.  I encouraged her to call me with any questions or concerns before her next visit at the cancer center, and I would be happy to see her sooner, if needed.    #. Problem(s) at Visit___none ________________.  #. Bone health:  Given Misty Combs age, history of breast cancer, and her current anti-estrogen therapy with ________, she is at risk for bone demineralization. Her last DEXA scan was on **/**/20**.  In the meantime, she was encouraged to increase her consumption of foods rich in calcium, as well as increase her weight-bearing activities.  She was given education on specific food and activities to promote bone health.  #. Cancer screening:  Due to Misty Combs's history and her age, she should receive screening for skin  cancers, colon cancer, and ***gynecologic cancers. She was encouraged to follow-up with her PCP for appropriate cancer screenings.   #. Health maintenance and wellness promotion: Misty Combs was encouraged to consume 5-7 servings of fruits and vegetables per day. She was also encouraged to engage in moderate to vigorous exercise for 30 minutes per day most days of the week. She was instructed to limit her alcohol consumption and continue to abstain from tobacco use/was encouraged stop smoking.  ***    Dispo:  -Return to cancer center as scheduled for CT abdomen/pelvis/chest and soft tissue neck on 07/24/2019.   A total of (30) minutes of face-to-face time was spent with this patient with greater than 50% of that time in counseling  and care-coordination.   Rulon Abide, NP AGNP-C [place] [phone]   Note: PRIMARY CARE PROVIDER Gala Lewandowsky, [place] [phone]

## 2019-04-04 ENCOUNTER — Other Ambulatory Visit: Payer: Self-pay

## 2019-04-04 ENCOUNTER — Other Ambulatory Visit: Payer: Self-pay | Admitting: Oncology

## 2019-04-04 ENCOUNTER — Encounter: Payer: Self-pay | Admitting: Oncology

## 2019-04-04 DIAGNOSIS — Z95828 Presence of other vascular implants and grafts: Secondary | ICD-10-CM

## 2019-04-04 NOTE — Progress Notes (Signed)
Patient Misty Combs cath removal date changed to 04/26/2019 per patient request @ 0800/0900. Pre procedure questions answered. Aware to be NPO after midnight prior to procedure. To be here at 0800. Stated understanding.

## 2019-04-08 ENCOUNTER — Ambulatory Visit: Admission: RE | Admit: 2019-04-08 | Payer: Commercial Managed Care - PPO | Source: Ambulatory Visit

## 2019-04-23 ENCOUNTER — Other Ambulatory Visit
Admission: RE | Admit: 2019-04-23 | Discharge: 2019-04-23 | Disposition: A | Discharge status: Home / Self Care | Payer: Commercial Managed Care - PPO | Source: Ambulatory Visit | Attending: Oncology | Admitting: Oncology

## 2019-04-23 DIAGNOSIS — Z20828 Contact with and (suspected) exposure to other viral communicable diseases: Secondary | ICD-10-CM | POA: Insufficient documentation

## 2019-04-23 DIAGNOSIS — Z01812 Encounter for preprocedural laboratory examination: Secondary | ICD-10-CM | POA: Insufficient documentation

## 2019-04-23 LAB — SARS CORONAVIRUS 2 (TAT 6-24 HRS): SARS Coronavirus 2: NEGATIVE

## 2019-04-26 ENCOUNTER — Ambulatory Visit
Admission: RE | Admit: 2019-04-26 | Discharge: 2019-04-26 | Disposition: A | Discharge status: Home / Self Care | Payer: Commercial Managed Care - PPO | Source: Ambulatory Visit | Attending: Oncology | Admitting: Oncology

## 2019-04-26 ENCOUNTER — Encounter: Payer: Self-pay | Admitting: Diagnostic Radiology

## 2019-04-26 ENCOUNTER — Other Ambulatory Visit: Payer: Self-pay

## 2019-04-26 DIAGNOSIS — C8190 Hodgkin lymphoma, unspecified, unspecified site: Secondary | ICD-10-CM | POA: Insufficient documentation

## 2019-04-26 DIAGNOSIS — Z452 Encounter for adjustment and management of vascular access device: Secondary | ICD-10-CM | POA: Diagnosis not present

## 2019-04-26 DIAGNOSIS — Z95828 Presence of other vascular implants and grafts: Secondary | ICD-10-CM

## 2019-04-26 DIAGNOSIS — Z79899 Other long term (current) drug therapy: Secondary | ICD-10-CM | POA: Insufficient documentation

## 2019-04-26 HISTORY — PX: IR REMOVAL TUN ACCESS W/ PORT W/O FL MOD SED: IMG2290

## 2019-04-26 MED ORDER — MIDAZOLAM HCL 2 MG/2ML IJ SOLN
INTRAMUSCULAR | Status: AC | PRN
  Administered 2019-04-26 (×2): 1 mg via INTRAVENOUS

## 2019-04-26 MED ORDER — FENTANYL CITRATE (PF) 100 MCG/2ML IJ SOLN
INTRAMUSCULAR | Status: AC
  Filled 2019-04-26: qty 2

## 2019-04-26 MED ORDER — HYDROCODONE-ACETAMINOPHEN 5-325 MG PO TABS: 1.0000 | ORAL_TABLET | ORAL | Status: DC | PRN

## 2019-04-26 MED ORDER — SODIUM CHLORIDE 0.9 % IV SOLN
INTRAVENOUS | Status: DC
  Administered 2019-04-26: 08:00:00 via INTRAVENOUS

## 2019-04-26 MED ORDER — FENTANYL CITRATE (PF) 100 MCG/2ML IJ SOLN
INTRAMUSCULAR | Status: AC | PRN
  Administered 2019-04-26 (×2): 50 ug via INTRAVENOUS

## 2019-04-26 MED ORDER — MIDAZOLAM HCL 5 MG/5ML IJ SOLN
INTRAMUSCULAR | Status: AC
  Filled 2019-04-26: qty 5

## 2019-04-26 NOTE — Discharge Instructions (Signed)
Incision Care, Adult °An incision is a cut that a doctor makes in your skin for surgery (for a procedure). Most times, these cuts are closed after surgery. Your cut from surgery may be closed with stitches (sutures), staples, skin glue, or skin tape (adhesive strips). You may need to return to your doctor to have stitches or staples taken out. This may happen many days or many weeks after your surgery. The cut needs to be well cared for so it does not get infected. °How to care for your cut °Cut care ° °· Follow instructions from your doctor about how to take care of your cut. Make sure you: °? Wash your hands with soap and water before you change your bandage (dressing). If you cannot use soap and water, use hand sanitizer. °? Change your bandage as told by your doctor. °? Leave stitches, skin glue, or skin tape in place. They may need to stay in place for 2 weeks or longer. If tape strips get loose and curl up, you may trim the loose edges. Do not remove tape strips completely unless your doctor says it is okay. °· Check your cut area every day for signs of infection. Check for: °? More redness, swelling, or pain. °? More fluid or blood. °? Warmth. °? Pus or a bad smell. °· Ask your doctor how to clean the cut. This may include: °? Using mild soap and water. °? Using a clean towel to pat the cut dry after you clean it. °? Putting a cream or ointment on the cut. Do this only as told by your doctor. °? Covering the cut with a clean bandage. °· Ask your doctor when you can leave the cut uncovered. °· Do not take baths, swim, or use a hot tub until your doctor says it is okay. Ask your doctor if you can take showers. You may only be allowed to take sponge baths for bathing. °Medicines °· If you were prescribed an antibiotic medicine, cream, or ointment, take the antibiotic or put it on the cut as told by your doctor. Do not stop taking or putting on the antibiotic even if your condition gets better. °· Take  over-the-counter and prescription medicines only as told by your doctor. °General instructions °· Limit movement around your cut. This helps healing. °? Avoid straining, lifting, or exercise for the first month, or for as long as told by your doctor. °? Follow instructions from your doctor about going back to your normal activities. °? Ask your doctor what activities are safe. °· Protect your cut from the sun when you are outside for the first 6 months, or for as long as told by your doctor. Put on sunscreen around the scar or cover up the scar. °· Keep all follow-up visits as told by your doctor. This is important. °Contact a doctor if: °· Your have more redness, swelling, or pain around the cut. °· You have more fluid or blood coming from the cut. °· Your cut feels warm to the touch. °· You have pus or a bad smell coming from the cut. °· You have a fever or shaking chills. °· You feel sick to your stomach (nauseous) or you throw up (vomit). °· You are dizzy. °· Your stitches or staples come undone. °Get help right away if: °· You have a red streak coming from your cut. °· Your cut bleeds through the bandage and the bleeding does not stop with gentle pressure. °· The edges of your cut   open up and separate. °· You have very bad (severe) pain. °· You have a rash. °· You are confused. °· You pass out (faint). °· You have trouble breathing and you have a fast heartbeat. °This information is not intended to replace advice given to you by your health care provider. Make sure you discuss any questions you have with your health care provider. °Document Released: 08/01/2011 Document Revised: 09/26/2016 Document Reviewed: 01/15/2016 °Elsevier Patient Education © 2020 [employer] ° °

## 2019-04-26 NOTE — Consult Note (Signed)
Chief Complaint: Patient was seen in consultation today for Port-A-Cath removal.  At the request of Finnegan,Timothy J  Referring Physician(s): Finnegan,Timothy J  Patient Status: [facility] - Out-pt  History of Present Illness: Misty Combs is a 33 y.o. female with history of stage II Hodgkin's lymphoma and a right jugular Port-A-Cath.  Patient has completed treatment and no longer needs a Port-A-Cath.  Port-A-Cath was placed by Dr. Kathlene Cote on 05/14/2018.  Patient reports no issues with the Port-A-Cath.  Patient is currently asymptomatic.    Past Medical History:  Diagnosis Date  . Asthma    exercise induced  . Migraines    no aura  . UTI (lower urinary tract infection)    post coital    Past Surgical History:  Procedure Laterality Date  . ELECTROMAGNETIC NAVIGATION BROCHOSCOPY Left 05/02/2018   Procedure: ELECTROMAGNETIC NAVIGATION BRONCHOSCOPY;  Surgeon: Flora Lipps, MD;  Location: [facility] ORS;  Service: Cardiopulmonary;  Laterality: Left;  . ESOPHAGOGASTRODUODENOSCOPY    . IR IMAGING GUIDED PORT INSERTION  05/14/2018  . KNEE SURGERY Left 2016    Allergies: Penicillins, Other, Sulfur, Tetanus toxoids, and Gluten meal  Medications: Prior to Admission medications   Medication Sig Start Date End Date Taking? Authorizing Provider  albuterol (PROVENTIL HFA;VENTOLIN HFA) 108 (90 BASE) MCG/ACT inhaler Inhale into the lungs every 6 (six) hours as needed for wheezing or shortness of breath.   Yes [provider]  ALPRAZolam (XANAX) 0.25 MG tablet Take 1 tablet by mouth 2 (two) times daily as needed. 12/10/18  Yes [provider]  amitriptyline (ELAVIL) 25 MG tablet Take 25 mg by mouth at bedtime.  04/02/18  Yes [provider]  azelastine (ASTELIN) 0.1 % nasal spray Place 2 sprays into both nostrils 2 (two) times daily. Use in each nostril as directed    Yes [provider]  budesonide (PULMICORT) 0.5 MG/2ML nebulizer solution USE 2 MLS  (0.5 MG TOTAL) BY NEBULIZATION 2 (TWO) TIMES DAILY. NASAL RINSE 12/23/13  Yes [provider]  dicyclomine (BENTYL) 20 MG tablet Take 20 mg by mouth 4 (four) times daily as needed for spasms.  04/02/18  Yes [provider]  fluticasone furoate-vilanterol (BREO ELLIPTA) 100-25 MCG/INH AEPB Inhale 1 puff into the lungs daily.  08/29/16  Yes [provider]  hydrOXYzine (VISTARIL) 25 MG capsule Take 1 capsule by mouth.   Yes [provider]  levocetirizine (XYZAL) 5 MG tablet Take 5 mg by mouth daily.   Yes [provider]  lidocaine-prilocaine (EMLA) cream Apply to affected area once 05/15/18  Yes Finnegan, Kathlene November, MD  Multiple Vitamin (MULTIVITAMIN WITH MINERALS) TABS tablet Take 1 tablet by mouth daily.   Yes [provider]  mupirocin ointment (BACTROBAN) 2 % Apply 1 application topically.   Yes [provider]  omeprazole (PRILOSEC) 20 MG capsule Take 20 mg by mouth daily.   Yes [provider]     Family History  Problem Relation Age of Onset  . Hypertension Mother   . Heart disease Father     Social History   Socioeconomic History  . Marital status: Married    Spouse name: Not on file  . Number of children: Not on file  . Years of education: Not on file  . Highest education level: Not on file  Occupational History  . Not on file  Social Needs  . Financial resource strain: Not on file  . Food insecurity    Worry: Not on file  Inability: Not on file  . Transportation needs    Medical: Not on file    Non-medical: Not on file  Tobacco Use  . Smoking status: Never Smoker  . Smokeless tobacco: Never Used  Substance and Sexual Activity  . Alcohol use: Yes    Alcohol/week: 2.0 standard drinks    Types: 2 Glasses of wine per week  . Drug use: No  . Sexual activity: Yes    Partners: Male    Birth control/protection: None    Comment: trying for pregnancy; LMP 05/02/18  Lifestyle  . Physical activity     Days per week: Not on file    Minutes per session: Not on file  . Stress: Not on file  Relationships  . Social Herbalist on phone: Not on file    Gets together: Not on file    Attends religious service: Not on file    Active member of club or organization: Not on file    Attends meetings of clubs or organizations: Not on file    Relationship status: Not on file  Other Topics Concern  . Not on file  Social History Narrative  . Not on file      Review of Systems  Constitutional: Negative.   Respiratory: Negative.   Cardiovascular: Negative.   Gastrointestinal: Positive for constipation.  Genitourinary: Negative.     Vital Signs: BP (!) 119/98   Pulse 78   Temp 98.4 F (36.9 C)   Resp 20   Ht 5\' 5"  (1.651 m)   Wt 111.1 kg   SpO2 100%   BMI 40.77 kg/m   Physical Exam Constitutional:      Appearance: Normal appearance.  Cardiovascular:     Rate and Rhythm: Normal rate and regular rhythm.  Pulmonary:     Effort: Pulmonary effort is normal.     Breath sounds: Normal breath sounds.  Abdominal:     General: Abdomen is flat.     Palpations: Abdomen is soft.  Neurological:     Mental Status: She is alert.     Imaging: No results found.  Labs:  CBC: Recent Labs    10/01/18 0851 10/08/18 0849 11/22/18 1015 01/24/19 0927  WBC 6.1 7.7 5.5 5.6  HGB 11.1* 12.0 12.1 12.3  HCT 34.5* 37.4 37.7 38.1  PLT 172 201 249 253    COAGS: Recent Labs    05/04/18 1020 05/14/18 0815  INR 1.07 1.02    BMP: Recent Labs    08/27/18 0812 09/10/18 0812 11/22/18 1015 01/24/19 0927  NA 137 139 138 138  K 3.8 4.0 4.4 4.0  CL 106 106 109 110  CO2 22 25 22  20*  GLUCOSE 117* 109* 104* 167*  BUN 16 16 19 20   CALCIUM 8.9 9.0 9.1 8.8*  CREATININE 0.69 0.78 0.76 0.64  GFRNONAA >60 >60 >60 >60  GFRAA >60 >60 >60 >60    LIVER FUNCTION TESTS: Recent Labs    08/27/18 0812 09/10/18 0812 11/22/18 1015 01/24/19 0927  BILITOT 0.4 0.4 0.5 0.5  AST 26 31  36 22  ALT 33 46* 57* 28  ALKPHOS 91 80 87 84  PROT 7.2 7.0 7.5 7.3  ALBUMIN 3.9 3.7 3.9 3.8    TUMOR MARKERS: No results for input(s): AFPTM, CEA, CA199, CHROMGRNA in the last 8760 hours.  Assessment and Plan:  33 year old with history of Hodgkin's lymphoma and completed treatment.  Port-A-Cath is no longer needed.  Patient presents for Port-A-Cath removal.  Risks of the procedure including bleeding and infection were discussed with the patient.  Informed consent was obtained.  Plan for Port-A-Cath removal with moderate sedation.   Electronically Signed: Burman Riis, MD 04/26/2019, 8:46 AM   I spent a total of    10 Minutes in face to face in clinical consultation, greater than 50% of which was counseling/coordinating care for Port-A-Cath removal.

## 2019-04-26 NOTE — Progress Notes (Signed)
Patient clinically stable post Porta Cath removal per Dr Anselm Pancoast, tolerated well. Awake/alert and oriented post procedure. Denies complaints at this time. Right port removal site dry and intact. Taking po's without difficulty. Brad/husband called with discharge instructions given over phone with patient in room with questions answered.

## 2019-04-26 NOTE — Procedures (Signed)
Interventional Radiology Procedure:   Indications: Hodgkin Lymphoma and completed treatment  Procedure: Port removal  Findings:Complete removal of port.  Complications: None     EBL: Minimal, less than 10 ml  Plan: Discharge in one hour.  Keep port site and incisions dry for at least 24 hours.     Lorianne Malbrough R. Anselm Pancoast, MD  Pager: [phone]

## 2019-05-22 ENCOUNTER — Inpatient Hospital Stay: Payer: Commercial Managed Care - PPO

## 2019-06-06 ENCOUNTER — Ambulatory Visit: Payer: Commercial Managed Care - PPO | Admitting: Radiation Oncology

## 2019-06-12 ENCOUNTER — Other Ambulatory Visit: Payer: Self-pay

## 2019-06-12 ENCOUNTER — Encounter: Payer: Self-pay | Admitting: Radiation Oncology

## 2019-06-13 ENCOUNTER — Other Ambulatory Visit: Payer: Self-pay

## 2019-06-13 ENCOUNTER — Ambulatory Visit
Admission: RE | Admit: 2019-06-13 | Discharge: 2019-06-13 | Disposition: A | Discharge status: Home / Self Care | Payer: Commercial Managed Care - PPO | Source: Ambulatory Visit | Attending: Radiation Oncology | Admitting: Radiation Oncology

## 2019-06-13 VITALS — BP 143/88 | HR 98 | Temp 97.6°F | Resp 16 | Wt 249.9 lb

## 2019-06-13 DIAGNOSIS — C8112 Nodular sclerosis classical Hodgkin lymphoma, intrathoracic lymph nodes: Secondary | ICD-10-CM

## 2019-06-13 NOTE — Progress Notes (Signed)
Radiation Oncology Follow up Note  Name: Misty Combs   Date:   06/13/2019 MRN:  [id number] DOB: 10-12-1985    This 34 y.o. female presents to the clinic today for 85-month follow-up status post involved field radiation therapy for stage II classic Hodgkin's lymphoma.  REFERRING PROVIDER: Gala Lewandowsky, MD  HPI: Patient is a 34 year old female now at 6 months having completed involved field radiation therapy to her chest for stage II classical Hodgkin's lymphoma status post ABVD chemotherapy.  Seen today in routine follow-up she is doing well.  She specifically denies fever chills night sweats or cough..  Patient had a PET scan which I have reviewed back in August showing further reduction in size of anterior mediastinal mass with maximum SUV Misty Combs 3 just the above blood pool activity.  She specifically denies cough hemoptysis or chest tightness.  COMPLICATIONS OF TREATMENT: none  FOLLOW UP COMPLIANCE: keeps appointments   PHYSICAL EXAM:  BP (!) 143/88   Pulse 98   Temp 97.6 F (36.4 C)   Resp 16   Wt 249 lb 14.4 oz (113.4 kg)   SpO2 99%   BMI 41.59 kg/m  No dominant mass or nodularity is noted in either breast in 2 positions examined.  No axillary or supraclavicular adenopathy or any peripheral adenopathy is identified.  Well-developed well-nourished patient in NAD. HEENT reveals PERLA, EOMI, discs not visualized.  Oral cavity is clear. No oral mucosal lesions are identified. Neck is clear without evidence of cervical or supraclavicular adenopathy. Lungs are clear to A&P. Cardiac examination is essentially unremarkable with regular rate and rhythm without murmur rub or thrill. Abdomen is benign with no organomegaly or masses noted. Motor sensory and DTR levels are equal and symmetric in the upper and lower extremities. Cranial nerves II through XII are grossly intact. Proprioception is intact. No peripheral adenopathy or edema is identified. No motor or sensory levels are  noted. Crude visual fields are within normal range.  RADIOLOGY RESULTS: PET CT scans reviewed compatible with above-stated findings  PLAN: Present time patient continues to do well with no evidence of disease.  I am pleased with her overall progress.  I have asked to see her back in 1 year for follow-up.  She continues close follow-up care with Dr. Grayland Combs.  Patient knows to call with any concerns.  I would like to take this opportunity to thank you for allowing me to participate in the care of your patient.Misty Filbert, MD

## 2019-07-18 ENCOUNTER — Inpatient Hospital Stay: Payer: Commercial Managed Care - PPO

## 2019-07-20 NOTE — Progress Notes (Signed)
[facility]  Telephone:([phone]) [phone] Fax:(336) [phone]  ID: [name] OB: 10-26-1985  MR#: [id number]  [id number]  Patient Care Team: Gala Lewandowsky, MD as PCP - General (Family Medicine) Lloyd Huger, MD as Consulting Physician (Oncology) Noreene Filbert, MD as Referring Physician (Radiation Oncology)  CHIEF COMPLAINT: Stage II Hodgkin's lymphoma.  INTERVAL HISTORY: Patient returns to clinic today for further evaluation and discussion of her imaging results.  She currently feels well and is asymptomatic. She has no neurologic complaints.  She denies any fevers, chills, night sweats, or weight loss.  She denies any chest pain, shortness of breath, cough, or hemoptysis.  She denies any nausea, vomiting, constipation, or diarrhea.  She has no urinary complaints.  Patient feels at her baseline offers no specific complaints today.    REVIEW OF SYSTEMS:   Review of Systems  Constitutional: Negative.  Negative for fever, malaise/fatigue and weight loss.  Respiratory: Negative.  Negative for cough and shortness of breath.   Cardiovascular: Negative.  Negative for chest pain and leg swelling.  Gastrointestinal: Negative.  Negative for abdominal pain, blood in stool and melena.  Genitourinary: Negative.  Negative for dysuria and flank pain.  Musculoskeletal: Negative.  Negative for back pain.  Skin: Negative.  Negative for itching and rash.  Neurological: Negative.  Negative for focal weakness, weakness and headaches.  Psychiatric/Behavioral: Negative.  The patient is not nervous/anxious.     As per HPI. Otherwise, a complete review of systems is negative.  PAST MEDICAL HISTORY: Past Medical History:  Diagnosis Date  . Asthma    exercise induced  . Migraines    no aura  . UTI (lower urinary tract infection)    post coital    PAST SURGICAL HISTORY: Past Surgical History:  Procedure Laterality Date  . ELECTROMAGNETIC NAVIGATION  BROCHOSCOPY Left 05/02/2018   Procedure: ELECTROMAGNETIC NAVIGATION BRONCHOSCOPY;  Surgeon: Flora Lipps, MD;  Location: [facility] ORS;  Service: Cardiopulmonary;  Laterality: Left;  . ESOPHAGOGASTRODUODENOSCOPY    . IR IMAGING GUIDED PORT INSERTION  05/14/2018  . IR REMOVAL TUN ACCESS W/ PORT W/O FL MOD SED  04/26/2019  . KNEE SURGERY Left 2016    FAMILY HISTORY: Family History  Problem Relation Age of Onset  . Hypertension Mother   . Heart disease Father     ADVANCED DIRECTIVES (Y/N):  N  HEALTH MAINTENANCE: Social History   Tobacco Use  . Smoking status: Never Smoker  . Smokeless tobacco: Never Used  Substance Use Topics  . Alcohol use: Yes    Alcohol/week: 2.0 standard drinks    Types: 2 Glasses of wine per week  . Drug use: No     Colonoscopy:  PAP:  Bone density:  Lipid panel:  Allergies  Allergen Reactions  . Penicillins Anaphylaxis  . Other     allergic to cats  . Sulfur Nausea And Vomiting    Dizzy  . Tetanus Toxoids Swelling    Fever and swelling  . Gluten Meal Rash    Sensitive to gluten    Current Outpatient Medications  Medication Sig Dispense Refill  . albuterol (PROVENTIL HFA;VENTOLIN HFA) 108 (90 BASE) MCG/ACT inhaler Inhale into the lungs every 6 (six) hours as needed for wheezing or shortness of breath.    . ALPRAZolam (XANAX) 0.25 MG tablet Take 1 tablet by mouth 2 (two) times daily as needed.    Marland Kitchen amitriptyline (ELAVIL) 50 MG tablet Take 50 mg by mouth at bedtime.    Marland Kitchen azelastine (ASTELIN) 0.1 %  nasal spray Place 2 sprays into both nostrils 2 (two) times daily. Use in each nostril as directed     . fluticasone furoate-vilanterol (BREO ELLIPTA) 100-25 MCG/INH AEPB Inhale 1 puff into the lungs daily.     Marland Kitchen levocetirizine (XYZAL) 5 MG tablet Take 5 mg by mouth daily.    . Multiple Vitamin (MULTIVITAMIN WITH MINERALS) TABS tablet Take 1 tablet by mouth daily.    Marland Kitchen omeprazole (PRILOSEC) 20 MG capsule Take 20 mg by mouth daily.     No current  facility-administered medications for this visit.    OBJECTIVE: Vitals:   07/25/19 1050  BP: 125/87  Pulse: 88  Temp: (!) 96.7 F (35.9 C)     Body mass index is 41.29 kg/m.    ECOG FS:0 - Asymptomatic  General: Well-developed, well-nourished, no acute distress. Eyes: Pink conjunctiva, anicteric sclera. HEENT: Normocephalic, moist mucous membranes. Lungs: No audible wheezing or coughing. Heart: Regular rate and rhythm. Abdomen: Soft, nontender, no obvious distention. Musculoskeletal: No edema, cyanosis, or clubbing. Neuro: Alert, answering all questions appropriately. Cranial nerves grossly intact. Skin: No rashes or petechiae noted. Psych: Normal affect. Lymphatics: No palpable lymphadenopathy.  LAB RESULTS:  Lab Results  Component Value Date   NA 138 01/24/2019   K 4.0 01/24/2019   CL 110 01/24/2019   CO2 20 (L) 01/24/2019   GLUCOSE 167 (H) 01/24/2019   BUN 20 01/24/2019   CREATININE 0.64 01/24/2019   CALCIUM 8.8 (L) 01/24/2019   PROT 7.3 01/24/2019   ALBUMIN 3.8 01/24/2019   AST 22 01/24/2019   ALT 28 01/24/2019   ALKPHOS 84 01/24/2019   BILITOT 0.5 01/24/2019   GFRNONAA >60 01/24/2019   GFRAA >60 01/24/2019    Lab Results  Component Value Date   WBC 5.6 01/24/2019   NEUTROABS 4.3 01/24/2019   HGB 12.3 01/24/2019   HCT 38.1 01/24/2019   MCV 89.0 01/24/2019   PLT 253 01/24/2019     STUDIES: CT SOFT TISSUE NECK W CONTRAST  Result Date: 07/24/2019 CLINICAL DATA:  Hodgkin's lymphoma restaging. EXAM: CT NECK WITH CONTRAST TECHNIQUE: Multidetector CT imaging of the neck was performed using the standard protocol following the bolus administration of intravenous contrast. CONTRAST:  138mL OMNIPAQUE IOHEXOL 300 MG/ML  SOLN COMPARISON:  None. FINDINGS: Pharynx and larynx: Normal. No mass or swelling. Salivary glands: No inflammation, mass, or stone. Thyroid: A 6 mm hypodense nodule is seen in the left thyroid lobe. Lymph nodes: None enlarged or abnormal density.  Vascular: Negative. Limited intracranial: Within normal limits. Visualized orbits: Negative. Mastoids and visualized paranasal sinuses: Clear. Skeleton: No acute or aggressive process. Upper chest: Reported separately Other: None. IMPRESSION: 1. No evidence of recurrent lymphoma in the neck. 2. A 6 mm left thyroid lobe nodule. Consider thyroid ultrasound for characterization. Electronically Signed   By: Pedro Earls M.D.   On: 07/24/2019 13:54   CT CHEST W CONTRAST  Result Date: 07/24/2019 CLINICAL DATA:  Restaging Hodgkin's lymphoma EXAM: CT CHEST, ABDOMEN, AND PELVIS WITH CONTRAST TECHNIQUE: Multidetector CT imaging of the chest, abdomen and pelvis was performed following the standard protocol during bolus administration of intravenous contrast. CONTRAST:  175mL OMNIPAQUE IOHEXOL 300 MG/ML SOLN, additional oral enteric contrast COMPARISON:  01/17/2019 FINDINGS: CT CHEST FINDINGS Cardiovascular: No significant vascular findings. Normal heart size. No pericardial effusion. Mediastinum/Nodes: No enlarged mediastinal, hilar, or axillary lymph nodes. Further interval reduction in minimal, residual bandlike soft tissue of the anterior mediastinum and AP window (series 3, image 21). Incidental 6  mm nodule of the left lobe of the thyroid (series 3, image 6). Trachea, and esophagus demonstrate no significant findings. Lungs/Pleura: Minimal bandlike scarring or atelectasis of the left pulmonary apex. No pleural effusion or pneumothorax. Musculoskeletal: No chest wall mass or suspicious bone lesions identified. Small subcutaneous cyst or nodule of the midline lower neck (series 3, image 4). CT ABDOMEN PELVIS FINDINGS Hepatobiliary: No solid liver abnormality is seen. Hepatic steatosis. No gallstones, gallbladder wall thickening, or biliary dilatation. Pancreas: Unremarkable. No pancreatic ductal dilatation or surrounding inflammatory changes. Spleen: Normal in size without significant abnormality.  Adrenals/Urinary Tract: Adrenal glands are unremarkable. Kidneys are normal, without renal calculi, solid lesion, or hydronephrosis. Bladder is unremarkable. Stomach/Bowel: Stomach is within normal limits. Appendix appears normal. No evidence of bowel wall thickening, distention, or inflammatory changes. Vascular/Lymphatic: No significant vascular findings are present. No enlarged abdominal or pelvic lymph nodes. Reproductive: No mass or other abnormality. Redemonstrated functional cysts or follicles of the bilateral ovaries. Other: No abdominal wall hernia or abnormality. No abdominopelvic ascites. Musculoskeletal: No acute or significant osseous findings. IMPRESSION: 1. Further interval reduction in minimal, residual bandlike soft tissue of the anterior mediastinum and AP window, consistent with treatment response. 2. Minimal bandlike scarring or atelectasis of the left pulmonary apex, consistent with radiation change. 3. No evidence of mass or lymphadenopathy within the chest, abdomen or pelvis. 4. Hepatic steatosis. Electronically Signed   By: Eddie Candle M.D.   On: 07/24/2019 11:57   CT Abdomen Pelvis W Contrast  Result Date: 07/24/2019 CLINICAL DATA:  Restaging Hodgkin's lymphoma EXAM: CT CHEST, ABDOMEN, AND PELVIS WITH CONTRAST TECHNIQUE: Multidetector CT imaging of the chest, abdomen and pelvis was performed following the standard protocol during bolus administration of intravenous contrast. CONTRAST:  172mL OMNIPAQUE IOHEXOL 300 MG/ML SOLN, additional oral enteric contrast COMPARISON:  01/17/2019 FINDINGS: CT CHEST FINDINGS Cardiovascular: No significant vascular findings. Normal heart size. No pericardial effusion. Mediastinum/Nodes: No enlarged mediastinal, hilar, or axillary lymph nodes. Further interval reduction in minimal, residual bandlike soft tissue of the anterior mediastinum and AP window (series 3, image 21). Incidental 6 mm nodule of the left lobe of the thyroid (series 3, image 6).  Trachea, and esophagus demonstrate no significant findings. Lungs/Pleura: Minimal bandlike scarring or atelectasis of the left pulmonary apex. No pleural effusion or pneumothorax. Musculoskeletal: No chest wall mass or suspicious bone lesions identified. Small subcutaneous cyst or nodule of the midline lower neck (series 3, image 4). CT ABDOMEN PELVIS FINDINGS Hepatobiliary: No solid liver abnormality is seen. Hepatic steatosis. No gallstones, gallbladder wall thickening, or biliary dilatation. Pancreas: Unremarkable. No pancreatic ductal dilatation or surrounding inflammatory changes. Spleen: Normal in size without significant abnormality. Adrenals/Urinary Tract: Adrenal glands are unremarkable. Kidneys are normal, without renal calculi, solid lesion, or hydronephrosis. Bladder is unremarkable. Stomach/Bowel: Stomach is within normal limits. Appendix appears normal. No evidence of bowel wall thickening, distention, or inflammatory changes. Vascular/Lymphatic: No significant vascular findings are present. No enlarged abdominal or pelvic lymph nodes. Reproductive: No mass or other abnormality. Redemonstrated functional cysts or follicles of the bilateral ovaries. Other: No abdominal wall hernia or abnormality. No abdominopelvic ascites. Musculoskeletal: No acute or significant osseous findings. IMPRESSION: 1. Further interval reduction in minimal, residual bandlike soft tissue of the anterior mediastinum and AP window, consistent with treatment response. 2. Minimal bandlike scarring or atelectasis of the left pulmonary apex, consistent with radiation change. 3. No evidence of mass or lymphadenopathy within the chest, abdomen or pelvis. 4. Hepatic steatosis. Electronically Signed   By: Cristie Hem  Laqueta Carina M.D.   On: 07/24/2019 11:57    ASSESSMENT: Stage II Hodgkin's lymphoma.  PLAN:    1.  Stage II Hodgkin's lymphoma: Patient completed chemotherapy with ABVD on August 27, 2018.  She completed XRT to her mediastinal mass  at the end of May 2020.  CT scan results from July 24, 2018 reviewed independently and reported as above with no obvious evidence of progressive or recurrent disease.  No intervention is needed at this time.  Continue imaging and evaluation every 6 months.  Patient will have video assisted telemedicine visit after her next imaging to discuss the results.    I spent a total of 20 minutes reviewing chart data, face-to-face evaluation with the patient, counseling and coordination of care as detailed above.   Patient expressed understanding and was in agreement with this plan. She also understands that She can call clinic at any time with any questions, concerns, or complaints.   Cancer Staging Hodgkin's lymphoma [facility]) Staging form: Hodgkin and Non-Hodgkin Lymphoma, AJCC 8th Edition - Clinical stage from 05/13/2018: Stage II bulky (Hodgkin lymphoma, A - Asymptomatic) - Signed by Lloyd Huger, MD on 05/13/2018   Lloyd Huger, MD   07/26/2019 6:42 AM

## 2019-07-24 ENCOUNTER — Other Ambulatory Visit: Payer: Self-pay

## 2019-07-24 ENCOUNTER — Other Ambulatory Visit: Payer: Commercial Managed Care - PPO

## 2019-07-24 ENCOUNTER — Ambulatory Visit
Admission: RE | Admit: 2019-07-24 | Discharge: 2019-07-24 | Disposition: A | Discharge status: Home / Self Care | Payer: Commercial Managed Care - PPO | Source: Ambulatory Visit | Attending: Oncology | Admitting: Oncology

## 2019-07-24 ENCOUNTER — Encounter: Payer: Self-pay | Admitting: Oncology

## 2019-07-24 DIAGNOSIS — C8112 Nodular sclerosis classical Hodgkin lymphoma, intrathoracic lymph nodes: Secondary | ICD-10-CM

## 2019-07-24 MED ORDER — IOHEXOL 300 MG/ML  SOLN
100.0000 mL | Freq: Once | INTRAMUSCULAR | Status: AC | PRN
  Administered 2019-07-24: 100 mL via INTRAVENOUS

## 2019-07-24 NOTE — Progress Notes (Signed)
Patient prescreened for appointment. Patient has no concerns or questions.  

## 2019-07-25 ENCOUNTER — Inpatient Hospital Stay: Payer: Commercial Managed Care - PPO | Attending: Oncology | Admitting: Oncology

## 2019-07-25 ENCOUNTER — Other Ambulatory Visit: Payer: Self-pay

## 2019-07-25 VITALS — BP 125/87 | HR 88 | Temp 96.7°F | Wt 248.1 lb

## 2019-07-25 DIAGNOSIS — J45909 Unspecified asthma, uncomplicated: Secondary | ICD-10-CM | POA: Diagnosis not present

## 2019-07-25 DIAGNOSIS — C8190 Hodgkin lymphoma, unspecified, unspecified site: Secondary | ICD-10-CM | POA: Insufficient documentation

## 2019-07-25 DIAGNOSIS — C8112 Nodular sclerosis classical Hodgkin lymphoma, intrathoracic lymph nodes: Secondary | ICD-10-CM | POA: Diagnosis not present

## 2019-07-25 DIAGNOSIS — Z923 Personal history of irradiation: Secondary | ICD-10-CM | POA: Insufficient documentation

## 2019-07-25 DIAGNOSIS — Z79899 Other long term (current) drug therapy: Secondary | ICD-10-CM | POA: Diagnosis not present

## 2019-08-14 ENCOUNTER — Encounter: Payer: Self-pay | Admitting: Certified Nurse Midwife

## 2019-10-19 IMAGING — CT NUCLEAR MEDICINE PET IMAGE RESTAGING (PS) SKULL BASE TO THIGH
1 of 2 series · 13 of 30 positions shown, 17 images · non-contrast
Comparison: 09/06/2018

CLINICAL DATA: Subsequent treatment strategy for nodular sclerosis
Hodgkin's lymphoma.

EXAM:
NUCLEAR MEDICINE PET SKULL BASE TO THIGH
TECHNIQUE: 12.7 mCi F-18 FDG was injected intravenously. Full-ring PET imaging
was performed from the skull base to thigh after the radiotracer. CT
data was obtained and used for attenuation correction and anatomic
localization.
Fasting blood glucose: 97 mg/dl

[Series 3: ct wb 5.0 b30f · axial · 0.98mm/px · z∈[-547,+296]mm · 13 of 329 slices shown, 17 images]
[im 24/329  brain]
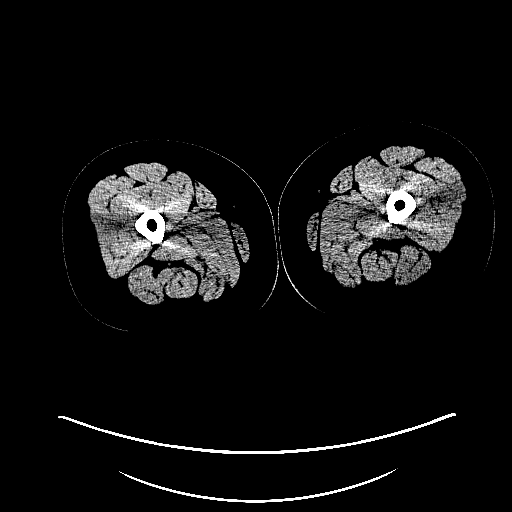
[im 24/329  bone]
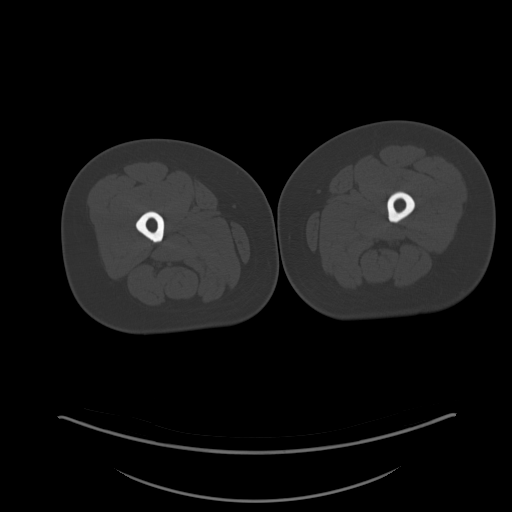
[im 47/329  brain]
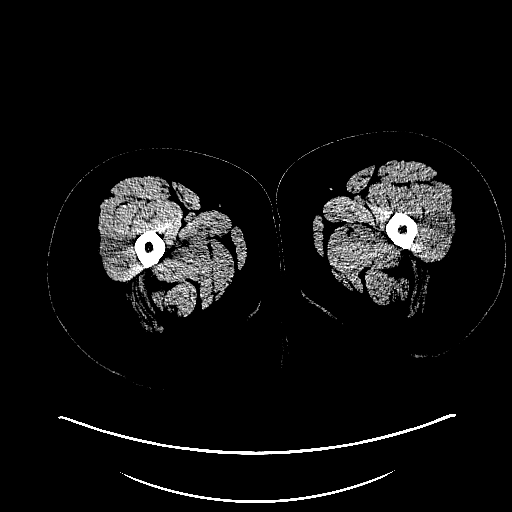
[im 71/329  brain]
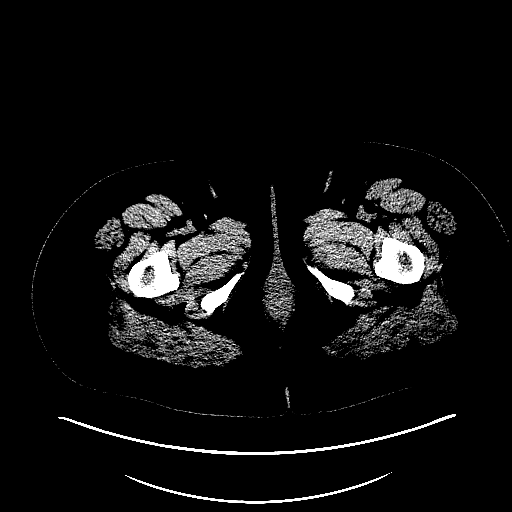
[im 94/329  brain]
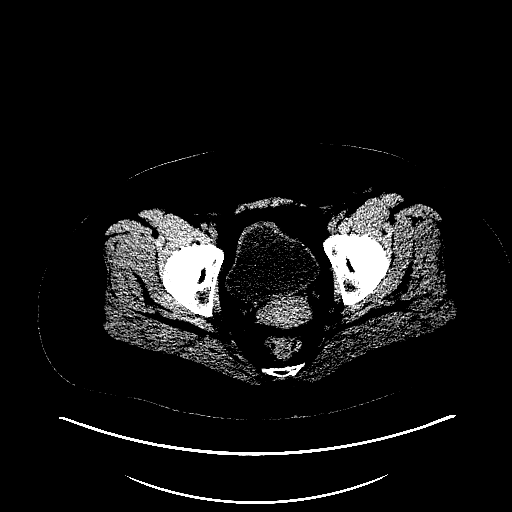
[im 118/329  brain]
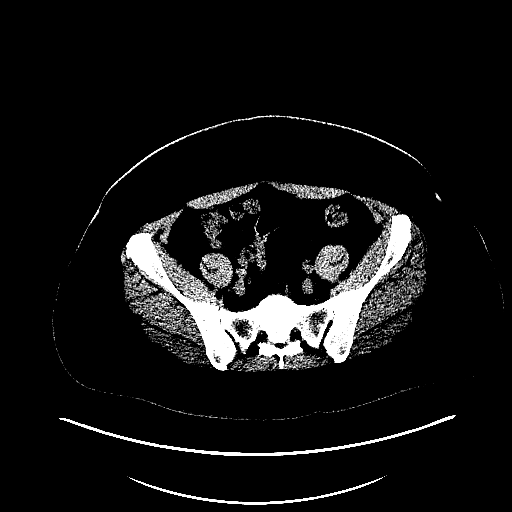
[im 118/329  bone]
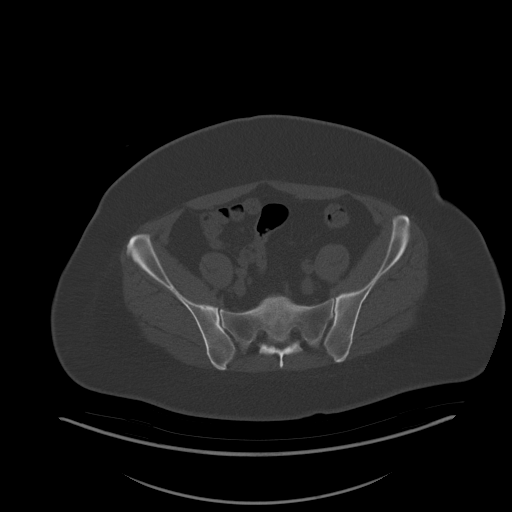
[im 141/329  brain]
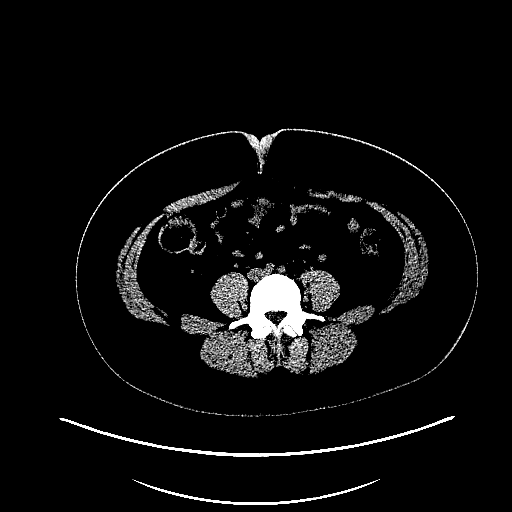
[im 165/329  brain]
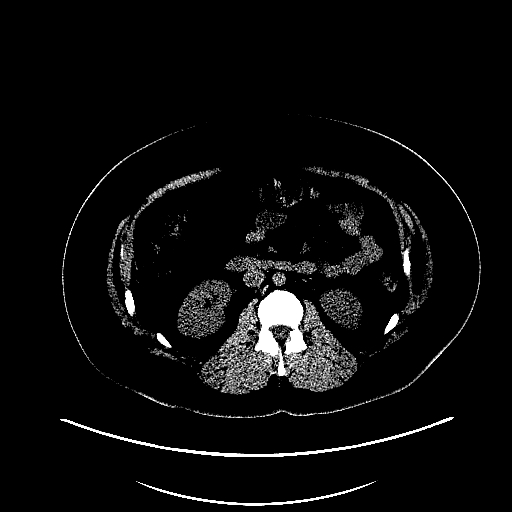
[im 188/329  brain]
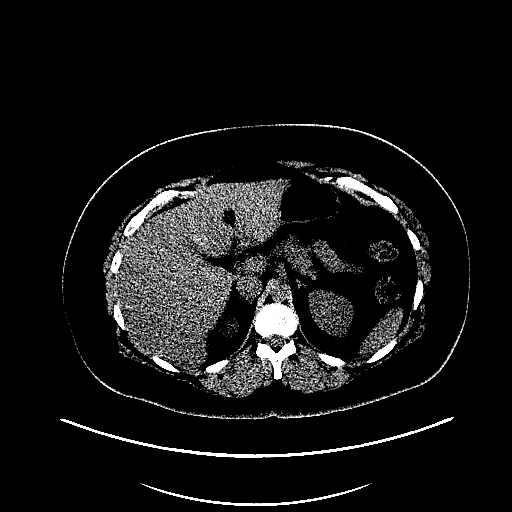
[im 211/329  brain]
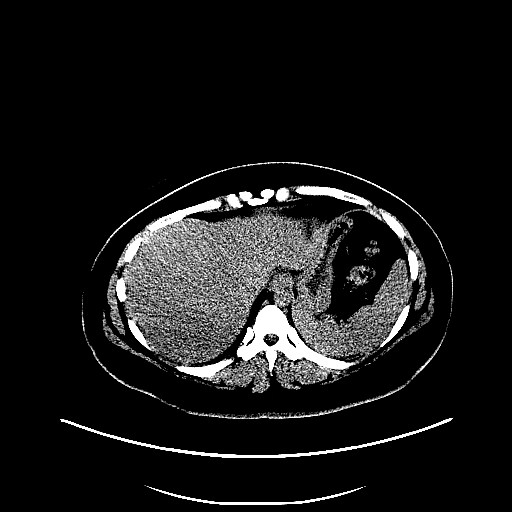
[im 211/329  bone]
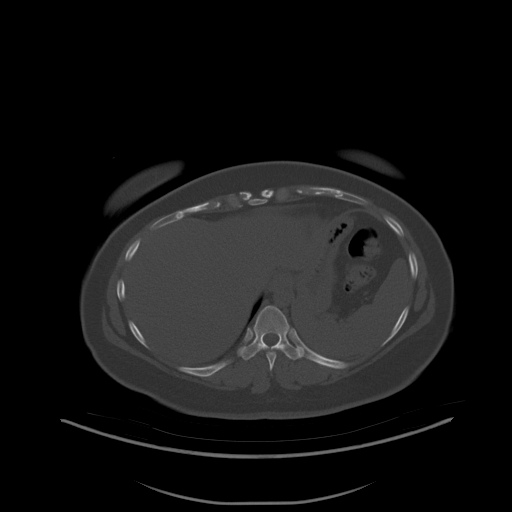
[im 235/329  brain]
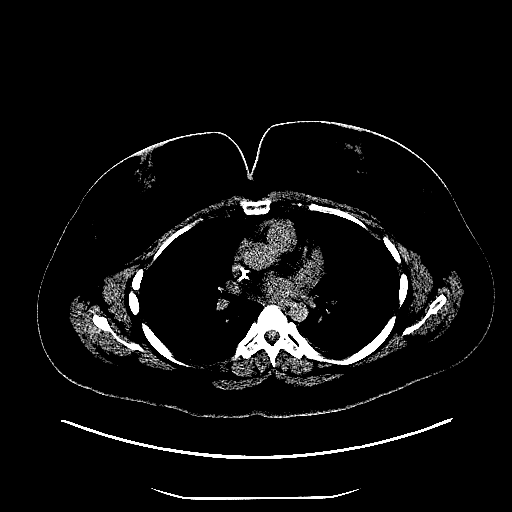
[im 258/329  brain]
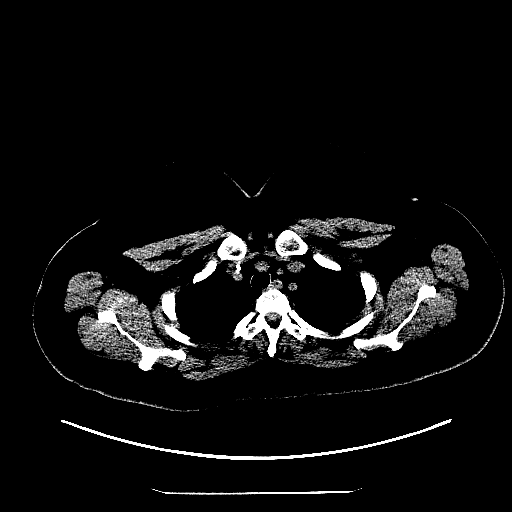
[im 282/329  brain]
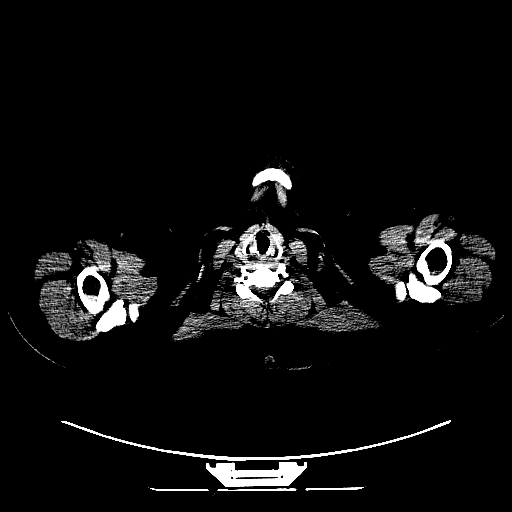
[im 305/329  brain]
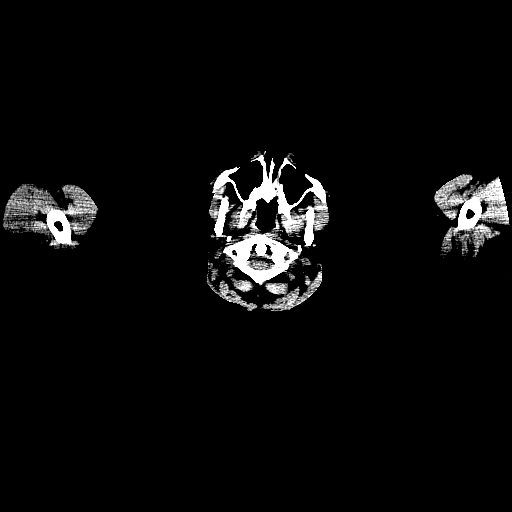
[im 305/329  bone]
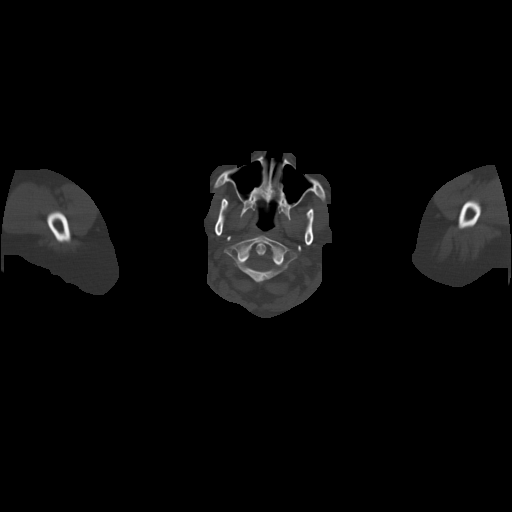

[13 of 30 positions shown; findings below may reference images not displayed]

FINDINGS: Mediastinal blood pool activity: SUV max

Liver activity: SUV max

NECK: Along the skin fold at the base of the skull there some mild
cutaneous nodularity/skin thickening with mild hypermetabolic
activity, maximum SUV 5.0, previously 3.4.

Incidental CT findings: none

CHEST: The previous anterior mediastinal mass is nearly completely
resolved, maximum SUV along the indistinct residual tissue
([[facility]] 3) and just above blood pool activity.

No current hypermetabolic or pathologic adenopathy in the chest.

Small right axillary cutaneous nodule shown on image 54/3 with
maximum SUV 2.9 ([[facility]OSPITAL] 2, previously 1.4 ([[facility]OSPITAL] 2).

Incidental CT findings: Right Port-A-Cath tip: Right atrium. Trace
pericardial fluid posteriorly. Bandlike linear subsegmental
atelectasis in the left upper lobe and left lower lobe.

ABDOMEN/PELVIS: No significant abnormal hypermetabolic activity in
this region. The previous small subcutaneous nodule along the right
lower anterior abdominal wall is no longer appreciable.

Incidental CT findings: none

SKELETON: No significant abnormal hypermetabolic activity in this
region.

Incidental CT findings: none
IMPRESSION: 1. Further reduction in size of the previous anterior mediastinal
mass which currently is indistinct and bandlike. Maximum SUV remains
[[facility]OS[facility]] 3, just above blood pool activity.
2. Small cutaneous nodules are again observed along the occipital
crease posteriorly and along the right axilla. These are nonspecific
and could be inflammatory, although small foci of cutaneous lymphoma
are not readily excluded.

## 2020-01-25 NOTE — Progress Notes (Signed)
[place]  Telephone:(336) [phone] Fax:(336) [phone]  ID: Misty Combs OB: 22-Feb-1986  MR#: [id number]  QQI#:[id number]  Patient Care Team: Gala Lewandowsky, MD as PCP - General (Family Medicine) Lloyd Huger, MD as Consulting Physician (Oncology) Noreene Filbert, MD as Referring Physician (Radiation Oncology)  I connected with Misty Combs on 01/31/20 at  2:45 PM EDT by video enabled telemedicine visit and verified that I am speaking with the correct person using two identifiers.   I discussed the limitations, risks, security and privacy concerns of performing an evaluation and management service by telemedicine and the availability of in-person appointments. I also discussed with the patient that there may be a patient responsible charge related to this service. The patient expressed understanding and agreed to proceed.   Other persons participating in the visit and their role in the encounter: Patient, MD.  Patient's location: Home. Provider's location: Clinic.  CHIEF COMPLAINT: Stage II Hodgkin's lymphoma.  INTERVAL HISTORY: Patient agreed to video assisted telemedicine visit for further evaluation and discussion of her imaging results.  She continues to feel well and remains asymptomatic.  She has no neurologic complaints.  She denies any fevers, chills, night sweats, or weight loss.  She denies any chest pain, shortness of breath, cough, or hemoptysis.  She denies any nausea, vomiting, constipation, or diarrhea.  She has no urinary complaints.  Patient offers no specific complaints today.  REVIEW OF SYSTEMS:   Review of Systems  Constitutional: Negative.  Negative for fever, malaise/fatigue and weight loss.  Respiratory: Negative.  Negative for cough and shortness of breath.   Cardiovascular: Negative.  Negative for chest pain and leg swelling.  Gastrointestinal: Negative.  Negative for abdominal pain, blood in stool and melena.   Genitourinary: Negative.  Negative for dysuria and flank pain.  Musculoskeletal: Negative.  Negative for back pain.  Skin: Negative.  Negative for itching and rash.  Neurological: Negative.  Negative for focal weakness, weakness and headaches.  Psychiatric/Behavioral: Negative.  The patient is not nervous/anxious.     As per HPI. Otherwise, a complete review of systems is negative.  PAST MEDICAL HISTORY: Past Medical History:  Diagnosis Date  . Asthma    exercise induced  . Migraines    no aura  . UTI (lower urinary tract infection)    post coital    PAST SURGICAL HISTORY: Past Surgical History:  Procedure Laterality Date  . ELECTROMAGNETIC NAVIGATION BROCHOSCOPY Left 05/02/2018   Procedure: ELECTROMAGNETIC NAVIGATION BRONCHOSCOPY;  Surgeon: Flora Lipps, MD;  Location: [facility] ORS;  Service: Cardiopulmonary;  Laterality: Left;  . ESOPHAGOGASTRODUODENOSCOPY    . IR IMAGING GUIDED PORT INSERTION  05/14/2018  . IR REMOVAL TUN ACCESS W/ PORT W/O FL MOD SED  04/26/2019  . KNEE SURGERY Left 2016    FAMILY HISTORY: Family History  Problem Relation Age of Onset  . Hypertension Mother   . Heart disease Father     ADVANCED DIRECTIVES (Y/N):  N  HEALTH MAINTENANCE: Social History   Tobacco Use  . Smoking status: Never Smoker  . Smokeless tobacco: Never Used  Vaping Use  . Vaping Use: Never used  Substance Use Topics  . Alcohol use: Yes    Alcohol/week: 2.0 standard drinks    Types: 2 Glasses of wine per week  . Drug use: No     Colonoscopy:  PAP:  Bone density:  Lipid panel:  Allergies  Allergen Reactions  . Penicillins Anaphylaxis  . Other     allergic  to cats  . Sulfur Nausea And Vomiting    Dizzy  . Tetanus Toxoids Swelling    Fever and swelling  . Gluten Meal Rash    Sensitive to gluten    Current Outpatient Medications  Medication Sig Dispense Refill  . albuterol (PROVENTIL HFA;VENTOLIN HFA) 108 (90 BASE) MCG/ACT inhaler Inhale into the lungs every  6 (six) hours as needed for wheezing or shortness of breath.    . ALPRAZolam (XANAX) 0.25 MG tablet Take 1 tablet by mouth 2 (two) times daily as needed.    Marland Kitchen amitriptyline (ELAVIL) 50 MG tablet Take 50 mg by mouth at bedtime.    Marland Kitchen levocetirizine (XYZAL) 5 MG tablet Take 5 mg by mouth daily.    . Multiple Vitamin (MULTIVITAMIN WITH MINERALS) TABS tablet Take 1 tablet by mouth daily.    Marland Kitchen omeprazole (PRILOSEC) 20 MG capsule Take 20 mg by mouth daily.     No current facility-administered medications for this visit.    OBJECTIVE: There were no vitals filed for this visit.   There is no height or weight on file to calculate BMI.    ECOG FS:0 - Asymptomatic  General: Well-developed, well-nourished, no acute distress. HEENT: Normocephalic. Neuro: Alert, answering all questions appropriately. Cranial nerves grossly intact. Psych: Normal affect.   LAB RESULTS:  Lab Results  Component Value Date   NA 136 01/28/2020   K 4.3 01/28/2020   CL 102 01/28/2020   CO2 26 01/28/2020   GLUCOSE 109 (H) 01/28/2020   BUN 14 01/28/2020   CREATININE 0.81 01/28/2020   CALCIUM 8.9 01/28/2020   PROT 7.6 01/28/2020   ALBUMIN 4.0 01/28/2020   AST 20 01/28/2020   ALT 33 01/28/2020   ALKPHOS 79 01/28/2020   BILITOT 0.6 01/28/2020   GFRNONAA >60 01/28/2020   GFRAA >60 01/28/2020    Lab Results  Component Value Date   WBC 7.9 01/28/2020   NEUTROABS 5.7 01/28/2020   HGB 13.3 01/28/2020   HCT 39.2 01/28/2020   MCV 89.3 01/28/2020   PLT 319 01/28/2020     STUDIES: CT SOFT TISSUE NECK W CONTRAST  Result Date: 01/28/2020 CLINICAL DATA:  34 year old female with Hodgkin's lymphoma. Completed therapy in April/May 2020. Restaging. EXAM: CT NECK WITH CONTRAST TECHNIQUE: Multidetector CT imaging of the neck was performed using the standard protocol following the bolus administration of intravenous contrast. CONTRAST:  157mL OMNIPAQUE IOHEXOL 300 MG/ML SOLN in conjunction with contrast enhanced imaging of  the chest, abdomen, and pelvis reported separately. COMPARISON:  Neck CT 07/24/2019. CT Chest, Abdomen, and Pelvis today are reported separately. FINDINGS: Pharynx and larynx: Laryngeal and pharyngeal soft tissue contours remain within normal limits. Negative parapharyngeal and retropharyngeal spaces. Salivary glands: Negative sublingual space, submandibular glands, parotid glands. Thyroid: Negative. Lymph nodes: No cervical lymphadenopathy. Small bilateral cervical nodes appears stable since March and measure up to 5 mm short axis. Vascular: Grossly patent major vascular structures in the neck and at the skull base. No atherosclerosis is evident. Limited intracranial: Negative. Visualized orbits: Negative. Mastoids and visualized paranasal sinuses: Visualized paranasal sinuses and mastoids are stable and well pneumatized. Skeleton: Negative. No acute or suspicious osseous lesion. Upper chest: Reported separately today. IMPRESSION: 1. Stable and negative post treatment CT appearance of the Neck. 2.  CT Chest, Abdomen, and Pelvis today are reported separately. Electronically Signed   By: Genevie Ann M.D.   On: 01/28/2020 12:19   CT Chest W Contrast  Result Date: 01/28/2020 CLINICAL DATA:  Hodgkin's lymphoma, restaging, chemotherapy  and XRT complete EXAM: CT CHEST, ABDOMEN, AND PELVIS WITH CONTRAST TECHNIQUE: Multidetector CT imaging of the chest, abdomen and pelvis was performed following the standard protocol during bolus administration of intravenous contrast. CONTRAST:  158mL OMNIPAQUE IOHEXOL 300 MG/ML SOLN, additional oral enteric contrast COMPARISON:  08/15/2019 FINDINGS: CT CHEST FINDINGS Cardiovascular: No significant vascular findings. Normal heart size. No pericardial effusion. Mediastinum/Nodes: No enlarged mediastinal, hilar, or axillary lymph nodes. There is no appreciable residual soft tissue within the anterior mediastinum or AP window (series 508, image 18). Thyroid gland, trachea, and esophagus  demonstrate no significant findings. Lungs/Pleura: Very minimal residual post radiation scarring of the medial left upper lobe (series 511, image 42). No pleural effusion or pneumothorax. Musculoskeletal: No chest wall mass or suspicious bone lesions identified. CT ABDOMEN PELVIS FINDINGS Hepatobiliary: No solid liver abnormality is seen. Hepatic steatosis. No gallstones, gallbladder wall thickening, or biliary dilatation. Pancreas: Unremarkable. No pancreatic ductal dilatation or surrounding inflammatory changes. Spleen: Normal in size without significant abnormality. Adrenals/Urinary Tract: Adrenal glands are unremarkable. Kidneys are normal, without renal calculi, solid lesion, or hydronephrosis. Bladder is unremarkable. Stomach/Bowel: Stomach is within normal limits. Appendix appears normal. No evidence of bowel wall thickening, distention, or inflammatory changes. Vascular/Lymphatic: No significant vascular findings are present. No enlarged abdominal or pelvic lymph nodes. Reproductive: No mass or other abnormality. Other: No abdominal wall hernia or abnormality. No abdominopelvic ascites. Musculoskeletal: No acute or significant osseous findings. IMPRESSION: 1. No appreciable residual soft tissue within the anterior mediastinum or AP window. 2. No evidence of lymphadenopathy within the chest, abdomen, or pelvis. 3. Very minimal residual post radiation scarring of the medial left upper lobe. 4. Hepatic steatosis. Electronically Signed   By: Eddie Candle M.D.   On: 01/28/2020 13:27   CT Abdomen Pelvis W Contrast  Result Date: 01/28/2020 CLINICAL DATA:  Hodgkin's lymphoma, restaging, chemotherapy and XRT complete EXAM: CT CHEST, ABDOMEN, AND PELVIS WITH CONTRAST TECHNIQUE: Multidetector CT imaging of the chest, abdomen and pelvis was performed following the standard protocol during bolus administration of intravenous contrast. CONTRAST:  127mL OMNIPAQUE IOHEXOL 300 MG/ML SOLN, additional oral enteric contrast  COMPARISON:  08/15/2019 FINDINGS: CT CHEST FINDINGS Cardiovascular: No significant vascular findings. Normal heart size. No pericardial effusion. Mediastinum/Nodes: No enlarged mediastinal, hilar, or axillary lymph nodes. There is no appreciable residual soft tissue within the anterior mediastinum or AP window (series 508, image 18). Thyroid gland, trachea, and esophagus demonstrate no significant findings. Lungs/Pleura: Very minimal residual post radiation scarring of the medial left upper lobe (series 511, image 42). No pleural effusion or pneumothorax. Musculoskeletal: No chest wall mass or suspicious bone lesions identified. CT ABDOMEN PELVIS FINDINGS Hepatobiliary: No solid liver abnormality is seen. Hepatic steatosis. No gallstones, gallbladder wall thickening, or biliary dilatation. Pancreas: Unremarkable. No pancreatic ductal dilatation or surrounding inflammatory changes. Spleen: Normal in size without significant abnormality. Adrenals/Urinary Tract: Adrenal glands are unremarkable. Kidneys are normal, without renal calculi, solid lesion, or hydronephrosis. Bladder is unremarkable. Stomach/Bowel: Stomach is within normal limits. Appendix appears normal. No evidence of bowel wall thickening, distention, or inflammatory changes. Vascular/Lymphatic: No significant vascular findings are present. No enlarged abdominal or pelvic lymph nodes. Reproductive: No mass or other abnormality. Other: No abdominal wall hernia or abnormality. No abdominopelvic ascites. Musculoskeletal: No acute or significant osseous findings. IMPRESSION: 1. No appreciable residual soft tissue within the anterior mediastinum or AP window. 2. No evidence of lymphadenopathy within the chest, abdomen, or pelvis. 3. Very minimal residual post radiation scarring of the medial left upper  lobe. 4. Hepatic steatosis. Electronically Signed   By: Eddie Candle M.D.   On: 01/28/2020 13:27    ASSESSMENT: Stage II Hodgkin's lymphoma.  PLAN:    1.   Stage II Hodgkin's lymphoma: No evidence of disease.  Patient completed chemotherapy with ABVD on August 27, 2018.  She completed XRT to her mediastinal mass at the end of May 2020.  CT scan results from January 30, 2019 reviewed independently and reported as above with no obvious evidence of recurrent or progressive disease.  No intervention is needed at this time.  Continue imaging and evaluation every 6 months for at least 2 to 3 years after completing her treatments.  Return to clinic in 6 months with imaging and video assisted telemedicine visit.  I provided 20 minutes of face-to-face video visit time during this encounter which included chart review, counseling, and coordination of care as documented above.   Patient expressed understanding and was in agreement with this plan. She also understands that She can call clinic at any time with any questions, concerns, or complaints.   Cancer Staging Hodgkin's lymphoma [facility]) Staging form: Hodgkin and Non-Hodgkin Lymphoma, AJCC 8th Edition - Clinical stage from 05/13/2018: Stage II bulky (Hodgkin lymphoma, A - Asymptomatic) - Signed by Lloyd Huger, MD on 05/13/2018   Lloyd Huger, MD   01/31/2020 12:22 PM

## 2020-01-28 ENCOUNTER — Other Ambulatory Visit: Payer: Self-pay

## 2020-01-28 ENCOUNTER — Inpatient Hospital Stay: Payer: Commercial Managed Care - PPO | Attending: Oncology

## 2020-01-28 ENCOUNTER — Ambulatory Visit
Admission: RE | Admit: 2020-01-28 | Discharge: 2020-01-28 | Disposition: A | Discharge status: Home / Self Care | Payer: Commercial Managed Care - PPO | Source: Ambulatory Visit | Attending: Oncology | Admitting: Oncology

## 2020-01-28 ENCOUNTER — Other Ambulatory Visit: Payer: Self-pay | Admitting: *Deleted

## 2020-01-28 DIAGNOSIS — Z923 Personal history of irradiation: Secondary | ICD-10-CM | POA: Diagnosis not present

## 2020-01-28 DIAGNOSIS — C8190 Hodgkin lymphoma, unspecified, unspecified site: Secondary | ICD-10-CM | POA: Insufficient documentation

## 2020-01-28 DIAGNOSIS — Z79899 Other long term (current) drug therapy: Secondary | ICD-10-CM | POA: Diagnosis not present

## 2020-01-28 DIAGNOSIS — C8112 Nodular sclerosis classical Hodgkin lymphoma, intrathoracic lymph nodes: Secondary | ICD-10-CM

## 2020-01-28 LAB — CBC WITH DIFFERENTIAL/PLATELET
Abs Immature Granulocytes: 0.03 10*3/uL (ref 0.00–0.07)
Basophils Absolute: 0 10*3/uL (ref 0.0–0.1)
Basophils Relative: 1 %
Eosinophils Absolute: 0.2 10*3/uL (ref 0.0–0.5)
Eosinophils Relative: 2 %
HCT: 39.2 % (ref 36.0–46.0)
Hemoglobin: 13.3 g/dL (ref 12.0–15.0)
Immature Granulocytes: 0 %
Lymphocytes Relative: 18 %
Lymphs Abs: 1.4 10*3/uL (ref 0.7–4.0)
MCH: 30.3 pg (ref 26.0–34.0)
MCHC: 33.9 g/dL (ref 30.0–36.0)
MCV: 89.3 fL (ref 80.0–100.0)
Monocytes Absolute: 0.6 10*3/uL (ref 0.1–1.0)
Monocytes Relative: 7 %
Neutro Abs: 5.7 10*3/uL (ref 1.7–7.7)
Neutrophils Relative %: 72 %
Platelets: 319 10*3/uL (ref 150–400)
RBC: 4.39 MIL/uL (ref 3.87–5.11)
RDW: 13.1 % (ref 11.5–15.5)
WBC: 7.9 10*3/uL (ref 4.0–10.5)
nRBC: 0 % (ref 0.0–0.2)

## 2020-01-28 LAB — COMPREHENSIVE METABOLIC PANEL
ALT: 33 U/L (ref 0–44)
AST: 20 U/L (ref 15–41)
Albumin: 4 g/dL (ref 3.5–5.0)
Alkaline Phosphatase: 79 U/L (ref 38–126)
Anion gap: 8 (ref 5–15)
BUN: 14 mg/dL (ref 6–20)
CO2: 26 mmol/L (ref 22–32)
Calcium: 8.9 mg/dL (ref 8.9–10.3)
Chloride: 102 mmol/L (ref 98–111)
Creatinine, Ser: 0.81 mg/dL (ref 0.44–1.00)
GFR calc Af Amer: >60 mL/min (ref >60)
GFR calc non Af Amer: >60 mL/min (ref >60)
Glucose, Bld: 109 mg/dL — ABNORMAL HIGH (ref 70–99)
Potassium: 4.3 mmol/L (ref 3.5–5.1)
Sodium: 136 mmol/L (ref 135–145)
Total Bilirubin: 0.6 mg/dL (ref 0.3–1.2)
Total Protein: 7.6 g/dL (ref 6.5–8.1)

## 2020-01-28 MED ORDER — IOHEXOL 300 MG/ML  SOLN
125.0000 mL | Freq: Once | INTRAMUSCULAR | Status: AC | PRN
  Administered 2020-01-28: 125 mL via INTRAVENOUS

## 2020-01-30 ENCOUNTER — Inpatient Hospital Stay (HOSPITAL_BASED_OUTPATIENT_CLINIC_OR_DEPARTMENT_OTHER): Payer: Commercial Managed Care - PPO | Admitting: Oncology

## 2020-01-30 ENCOUNTER — Encounter: Payer: Self-pay | Admitting: Oncology

## 2020-01-30 ENCOUNTER — Other Ambulatory Visit: Payer: Self-pay

## 2020-01-30 DIAGNOSIS — C8112 Nodular sclerosis classical Hodgkin lymphoma, intrathoracic lymph nodes: Secondary | ICD-10-CM

## 2020-01-30 NOTE — Progress Notes (Signed)
Patient called for pre assessment. Denies any pain or concerns at this time.  °

## 2020-04-10 DIAGNOSIS — R002 Palpitations: Secondary | ICD-10-CM | POA: Insufficient documentation

## 2020-04-15 DIAGNOSIS — R000 Tachycardia, unspecified: Secondary | ICD-10-CM | POA: Insufficient documentation

## 2020-04-15 DIAGNOSIS — Z9221 Personal history of antineoplastic chemotherapy: Secondary | ICD-10-CM | POA: Insufficient documentation

## 2020-06-11 ENCOUNTER — Ambulatory Visit: Payer: Commercial Managed Care - PPO | Admitting: Radiation Oncology

## 2020-07-23 ENCOUNTER — Other Ambulatory Visit: Payer: Self-pay

## 2020-07-23 ENCOUNTER — Ambulatory Visit
Admission: RE | Admit: 2020-07-23 | Discharge: 2020-07-23 | Disposition: A | Discharge status: Home / Self Care | Payer: Commercial Managed Care - PPO | Source: Ambulatory Visit | Attending: Oncology | Admitting: Oncology

## 2020-07-23 DIAGNOSIS — C8112 Nodular sclerosis classical Hodgkin lymphoma, intrathoracic lymph nodes: Secondary | ICD-10-CM | POA: Diagnosis present

## 2020-07-23 HISTORY — DX: Essential (primary) hypertension: I10

## 2020-07-23 LAB — POCT I-STAT CREATININE: Creatinine, Ser: 0.8 mg/dL (ref 0.44–1.00)

## 2020-07-23 MED ORDER — IOHEXOL 300 MG/ML  SOLN
100.0000 mL | Freq: Once | INTRAMUSCULAR | Status: AC | PRN
  Administered 2020-07-23: 100 mL via INTRAVENOUS

## 2020-07-25 NOTE — Progress Notes (Signed)
[place]  Telephone:(336) [phone] Fax:(336) [phone]  ID: Misty Combs OB: 01-12-1986  MR#: [id number]  JZP#:[id number]  Patient Care Team: Gala Lewandowsky, MD as PCP - General (Family Medicine) Lloyd Huger, MD as Consulting Physician (Oncology) Noreene Filbert, MD as Referring Physician (Radiation Oncology)  I connected with Misty Combs on 07/29/20 at  2:30 PM EST by video enabled telemedicine visit and verified that I am speaking with the correct person using two identifiers.   I discussed the limitations, risks, security and privacy concerns of performing an evaluation and management service by telemedicine and the availability of in-person appointments. I also discussed with the patient that there may be a patient responsible charge related to this service. The patient expressed understanding and agreed to proceed.   Other persons participating in the visit and their role in the encounter: Patient, MD.  Patient's location: Home. Provider's location: Clinic.  CHIEF COMPLAINT: Stage II Hodgkin's lymphoma.  INTERVAL HISTORY: Patient agreed to video assisted telemedicine visit for further evaluation and discussion of her imaging results.  She continues to feel well and remains asymptomatic. She has no neurologic complaints.  She denies any fevers, chills, night sweats, or weight loss.  She denies any chest pain, shortness of breath, cough, or hemoptysis.  She denies any nausea, vomiting, constipation, or diarrhea.  She has no urinary complaints.  Patient feels at her baseline offers no specific complaints today.  REVIEW OF SYSTEMS:   Review of Systems  Constitutional: Negative.  Negative for fever, malaise/fatigue and weight loss.  Respiratory: Negative.  Negative for cough and shortness of breath.   Cardiovascular: Negative.  Negative for chest pain and leg swelling.  Gastrointestinal: Negative.  Negative for abdominal pain, blood  in stool and melena.  Genitourinary: Negative.  Negative for dysuria and flank pain.  Musculoskeletal: Negative.  Negative for back pain.  Skin: Negative.  Negative for itching and rash.  Neurological: Negative.  Negative for focal weakness, weakness and headaches.  Psychiatric/Behavioral: Negative.  The patient is not nervous/anxious.     As per HPI. Otherwise, a complete review of systems is negative.  PAST MEDICAL HISTORY: Past Medical History:  Diagnosis Date  . Asthma    exercise induced  . Hypertension   . Migraines    no aura  . UTI (lower urinary tract infection)    post coital    PAST SURGICAL HISTORY: Past Surgical History:  Procedure Laterality Date  . ELECTROMAGNETIC NAVIGATION BROCHOSCOPY Left 05/02/2018   Procedure: ELECTROMAGNETIC NAVIGATION BRONCHOSCOPY;  Surgeon: Flora Lipps, MD;  Location: [facility] ORS;  Service: Cardiopulmonary;  Laterality: Left;  . ESOPHAGOGASTRODUODENOSCOPY    . IR IMAGING GUIDED PORT INSERTION  05/14/2018  . IR REMOVAL TUN ACCESS W/ PORT W/O FL MOD SED  04/26/2019  . KNEE SURGERY Left 2016    FAMILY HISTORY: Family History  Problem Relation Age of Onset  . Hypertension Mother   . Heart disease Father     ADVANCED DIRECTIVES (Y/N):  N  HEALTH MAINTENANCE: Social History   Tobacco Use  . Smoking status: Never Smoker  . Smokeless tobacco: Never Used  Vaping Use  . Vaping Use: Never used  Substance Use Topics  . Alcohol use: Yes    Alcohol/week: 2.0 standard drinks    Types: 2 Glasses of wine per week  . Drug use: No     Colonoscopy:  PAP:  Bone density:  Lipid panel:  Allergies  Allergen Reactions  . Penicillins Anaphylaxis  .  Elemental Sulfur Nausea And Vomiting    Dizzy  . Other     allergic to cats  . Tetanus Toxoids Swelling    Fever and swelling  . Gluten Meal Rash    Sensitive to gluten    Current Outpatient Medications  Medication Sig Dispense Refill  . albuterol (PROVENTIL HFA;VENTOLIN HFA) 108 (90  BASE) MCG/ACT inhaler Inhale into the lungs every 6 (six) hours as needed for wheezing or shortness of breath.    Marland Kitchen amitriptyline (ELAVIL) 50 MG tablet Take 50 mg by mouth at bedtime.    Marland Kitchen levocetirizine (XYZAL) 5 MG tablet Take 5 mg by mouth daily.    . Multiple Vitamin (MULTIVITAMIN WITH MINERALS) TABS tablet Take 1 tablet by mouth daily.    Marland Kitchen omeprazole (PRILOSEC) 20 MG capsule Take 20 mg by mouth daily.     No current facility-administered medications for this visit.    OBJECTIVE: There were no vitals filed for this visit.   There is no height or weight on file to calculate BMI.    ECOG FS:0 - Asymptomatic  General: Well-developed, well-nourished, no acute distress. HEENT: Normocephalic. Neuro: Alert, answering all questions appropriately. Cranial nerves grossly intact. Psych: Normal affect.  LAB RESULTS:  Lab Results  Component Value Date   NA 136 01/28/2020   K 4.3 01/28/2020   CL 102 01/28/2020   CO2 26 01/28/2020   GLUCOSE 109 (H) 01/28/2020   BUN 14 01/28/2020   CREATININE 0.80 07/23/2020   CALCIUM 8.9 01/28/2020   PROT 7.6 01/28/2020   ALBUMIN 4.0 01/28/2020   AST 20 01/28/2020   ALT 33 01/28/2020   ALKPHOS 79 01/28/2020   BILITOT 0.6 01/28/2020   GFRNONAA >60 01/28/2020   GFRAA >60 01/28/2020    Lab Results  Component Value Date   WBC 7.9 01/28/2020   NEUTROABS 5.7 01/28/2020   HGB 13.3 01/28/2020   HCT 39.2 01/28/2020   MCV 89.3 01/28/2020   PLT 319 01/28/2020     STUDIES: CT SOFT TISSUE NECK W CONTRAST  Result Date: 07/23/2020 CLINICAL DATA:  Hodgkin's lymphoma restaging EXAM: CT NECK WITH CONTRAST TECHNIQUE: Multidetector CT imaging of the neck was performed using the standard protocol following the bolus administration of intravenous contrast. CONTRAST:  174mL OMNIPAQUE IOHEXOL 300 MG/ML  SOLN COMPARISON:  CT neck 01/28/2020 FINDINGS: Pharynx and larynx: Normal. No mass or swelling. Salivary glands: No inflammation, mass, or stone. Thyroid: Negative  Lymph nodes: No enlarged or pathologic lymph nodes. Sub 5 mm level 2 lymph nodes bilaterally are stable. Vascular: Normal vascular enhancement. Limited intracranial: Negative Visualized orbits: Negative Mastoids and visualized paranasal sinuses: Small air-fluid levels in the maxillary sinus bilaterally. Remaining sinuses clear. Skeleton: No acute skeletal abnormality. Upper chest: Lung apices clear bilaterally. Other: None IMPRESSION: No evidence of recurrent lymphoma. No new or enlarging lymph nodes. No change from the prior CT. Electronically Signed   By: Franchot Gallo M.D.   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 CONTRAST:  128mL OMNIPAQUE IOHEXOL 300 MG/ML  SOLN COMPARISON:  Multiple priors including most recent CT chest abdomen pelvis January 28, 2020 FINDINGS: CT CHEST FINDINGS Cardiovascular: No thoracic aortic aneurysm. Normal aortic arch branching pattern. No central pulmonary  embolus. Normal size heart. No pericardial effusion. Mediastinum/Nodes: No mediastinal, hilar or axillary lymphadenopathy. No appreciable abnormal soft tissue visualized within the region of previous soft tissue prevascular/anterior mediastinal mass. Visualized portions of the thyroid are unremarkable. Trachea and esophagus are unremarkable. Lungs/Pleura: No suspicious pulmonary nodules or masses. No pleural effusion. No pneumothorax. Musculoskeletal: No chest wall mass or suspicious bone lesions identified. CT ABDOMEN PELVIS FINDINGS Hepatobiliary: Hepatic steatosis. No suspicious hepatic lesion. Gallbladder is unremarkable. No biliary ductal dilation. Pancreas: Unremarkable  Spleen: No splenomegaly Adrenals/Urinary Tract: Bilateral adrenal glands are unremarkable. No hydronephrosis. No suspicious renal masses. Urinary bladder is unremarkable. Stomach/Bowel: Stomach is unremarkable. Normal positioning of the duodenum/ligament of [place]. No suspicious small bowel wall thickening or dilation. Appendix and terminal ileum appear unremarkable. No suspicious colonic wall thickening or mass like lesions. Vascular/Lymphatic: No significant vascular findings are present. No enlarged abdominal or pelvic lymph nodes. Reproductive: Uterus and bilateral adnexa are unremarkable. Other: No abdominopelvic ascites. Musculoskeletal: No acute or significant osseous findings. IMPRESSION: 1. No appreciable abnormal soft tissue visualized within the region of previous soft tissue prevascular/anterior mediastinal mass. 2. No acute findings within the chest, abdomen, or pelvis. Electronically Signed   By: Dahlia Bailiff MD   On: 07/24/2020 09:07    ASSESSMENT: Stage II Hodgkin's lymphoma.  PLAN:    1.  Stage II Hodgkin's lymphoma: No evidence of disease.  Patient completed chemotherapy with ABVD on August 27, 2018.  She completed XRT to her mediastinal mass at the end of May 2020.  CT scan results from July 24, 2020 reviewed independently and reported as above with no obvious evidence of recurrent or progressive disease.  No intervention is needed at this time.  Patient is now nearly 2 years removed from completing her treatments and can be transitioned to yearly imaging and evaluation.  I provided 20 minutes of face-to-face video visit time during this encounter which included chart review, counseling, and coordination of care as documented above.   Patient expressed understanding and was in agreement with this plan. She also understands that She can call clinic at any time with any questions, concerns, or complaints.   Cancer Staging Hodgkin's lymphoma [facility]) Staging form: Hodgkin and Non-Hodgkin  Lymphoma, AJCC 8th Edition - Clinical stage from 05/13/2018: Stage II bulky (Hodgkin lymphoma, A - Asymptomatic) - Signed by Lloyd Huger, MD on 05/13/2018   Lloyd Huger, MD   07/29/2020 6:35 AM

## 2020-07-28 ENCOUNTER — Encounter: Payer: Self-pay | Admitting: Oncology

## 2020-07-28 ENCOUNTER — Inpatient Hospital Stay: Payer: Commercial Managed Care - PPO | Attending: Oncology | Admitting: Oncology

## 2020-07-28 DIAGNOSIS — C8112 Nodular sclerosis classical Hodgkin lymphoma, intrathoracic lymph nodes: Secondary | ICD-10-CM

## 2020-07-28 NOTE — Progress Notes (Signed)
Patient denies any concerns today.  

## 2020-07-30 ENCOUNTER — Ambulatory Visit
Admission: RE | Admit: 2020-07-30 | Discharge: 2020-07-30 | Disposition: A | Discharge status: Home / Self Care | Payer: Commercial Managed Care - PPO | Source: Ambulatory Visit | Attending: Radiation Oncology | Admitting: Radiation Oncology

## 2020-07-30 ENCOUNTER — Other Ambulatory Visit: Payer: Self-pay

## 2020-07-30 ENCOUNTER — Encounter: Payer: Self-pay | Admitting: Radiation Oncology

## 2020-07-30 VITALS — BP 143/94 | HR 87 | Temp 96.9°F | Resp 16 | Wt 245.6 lb

## 2020-07-30 DIAGNOSIS — C8112 Nodular sclerosis classical Hodgkin lymphoma, intrathoracic lymph nodes: Secondary | ICD-10-CM

## 2020-07-30 DIAGNOSIS — Z923 Personal history of irradiation: Secondary | ICD-10-CM | POA: Diagnosis not present

## 2020-07-30 DIAGNOSIS — Z8572 Personal history of non-Hodgkin lymphomas: Secondary | ICD-10-CM | POA: Diagnosis present

## 2020-07-30 NOTE — Progress Notes (Signed)
Radiation Oncology Follow up Note  Name: Misty Combs   Date:   07/30/2020 MRN:  [id number] DOB: 06-Jun-1985    This 35 y.o. female presents to the clinic today for 31-month follow-up status post involved field radiation therapy for stage II classic Hodgkin's lymphoma.  REFERRING PROVIDER: Gala Lewandowsky, MD  HPI: Patient is a 35 year old female now out 19 months having completed involved field radiation therapy to her chest for stage II classic Hodgkin's lymphoma status post ABVD chemotherapy.  Seen today in routine follow-up she is doing well.  She is asymptomatic she specifically denies fever chills night sweats weight loss.  She had a recent CT scan of chest abdomen pelvis which I have reviewed.  Showing no appreciable abnormal soft tissue or evidence of disease.  COMPLICATIONS OF TREATMENT: none  FOLLOW UP COMPLIANCE: keeps appointments   PHYSICAL EXAM:  BP (!) 143/94 (BP Location: Left Arm, Patient Position: Sitting)   Pulse 87   Temp (!) 96.9 F (36.1 C) (Tympanic)   Resp 16   Wt 245 lb 9.6 oz (111.4 kg)   BMI 40.87 kg/m  Well-developed well-nourished patient in NAD. HEENT reveals PERLA, EOMI, discs not visualized.  Oral cavity is clear. No oral mucosal lesions are identified. Neck is clear without evidence of cervical or supraclavicular adenopathy. Lungs are clear to A&P. Cardiac examination is essentially unremarkable with regular rate and rhythm without murmur rub or thrill. Abdomen is benign with no organomegaly or masses noted. Motor sensory and DTR levels are equal and symmetric in the upper and lower extremities. Cranial nerves II through XII are grossly intact. Proprioception is intact. No peripheral adenopathy or edema is identified. No motor or sensory levels are noted. Crude visual fields are within normal range.  RADIOLOGY RESULTS: CT scans chest abdomen pelvis reviewed compatible with above-stated findings  PLAN: Present time patient continues to do well  now close to 20 months out from involved field radiation with no evidence of disease.  I am pleased with her overall progress.  At this time I will turn follow-up care over to Dr. Grayland Ormond.  I would be happy to reevaluate the patient anytime the future should that be indicated.  Patient knows to call with any concerns.  I would like to take this opportunity to thank you for allowing me to participate in the care of your patient.Noreene Filbert, MD

## 2020-08-03 ENCOUNTER — Telehealth: Payer: Commercial Managed Care - PPO | Admitting: Oncology

## 2020-10-29 IMAGING — CT CT CHEST W/ CM
2 of 4 series · 14 of 36 positions shown, 17 images · IV contrast (omnipaque)
Comparison: 08/15/2019

CLINICAL DATA: Hodgkin's lymphoma, restaging, chemotherapy and XRT
complete

EXAM:
CT CHEST, ABDOMEN, AND PELVIS WITH CONTRAST
TECHNIQUE: Multidetector CT imaging of the chest, abdomen and pelvis was
performed following the standard protocol during bolus
administration of intravenous contrast.
CONTRAST:  125mL OMNIPAQUE IOHEXOL 300 MG/ML SOLN, additional oral
enteric contrast

[Series 3: cap with · axial · 0.84mm/px · z∈[-398,+147]mm · 11 of 131 slices shown, 14 images]
[im 11/131  mediastinal]
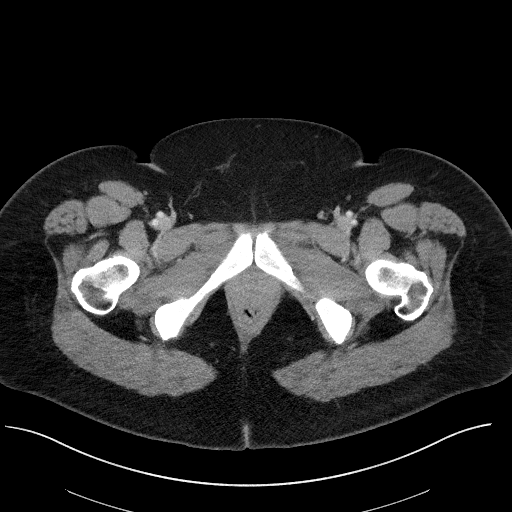
[im 11/131  lung]
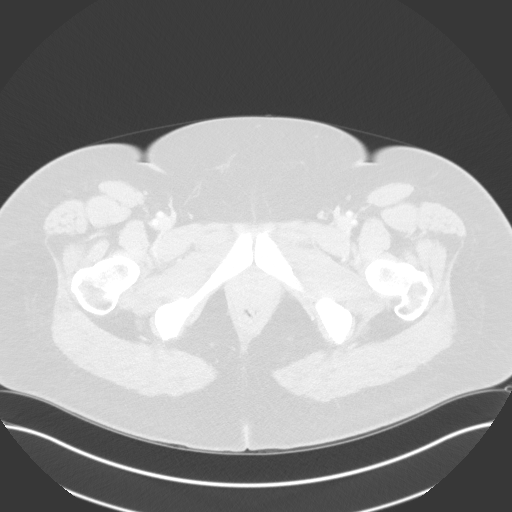
[im 22/131  lung]
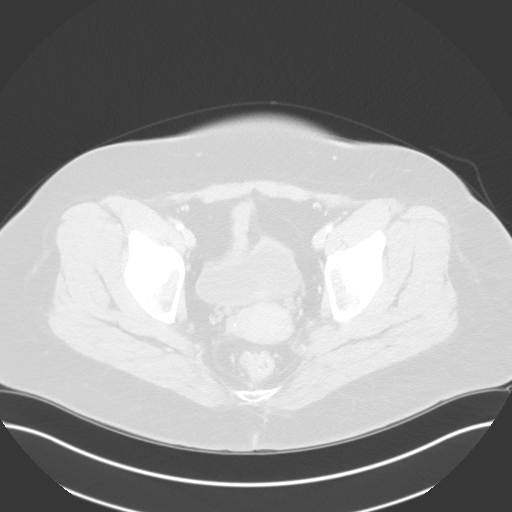
[im 33/131  lung]
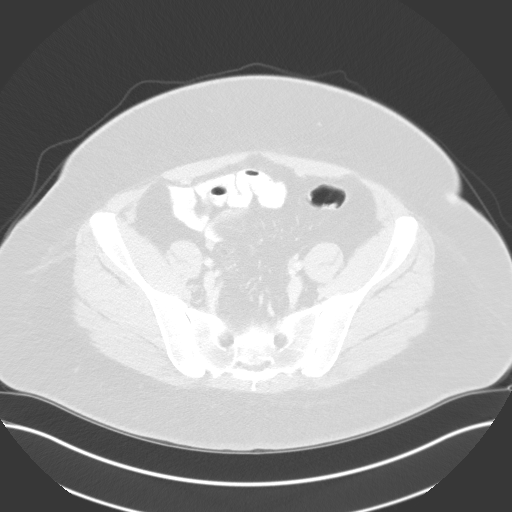
[im 44/131  lung]
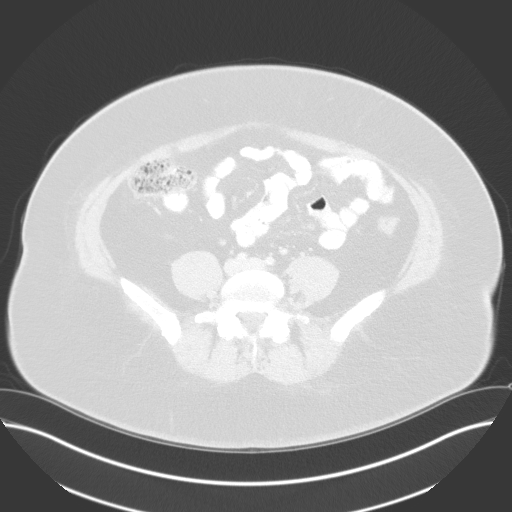
[im 55/131  mediastinal]
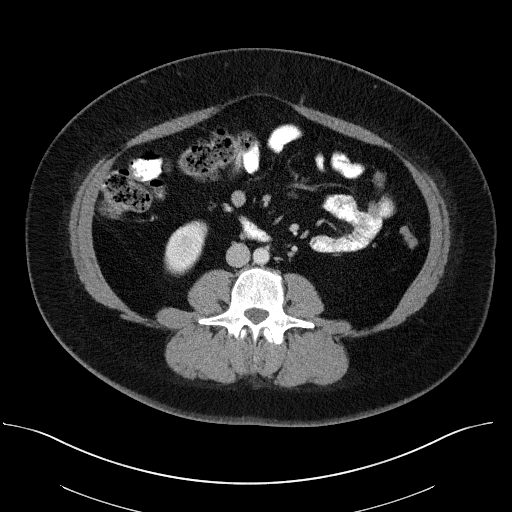
[im 55/131  lung]
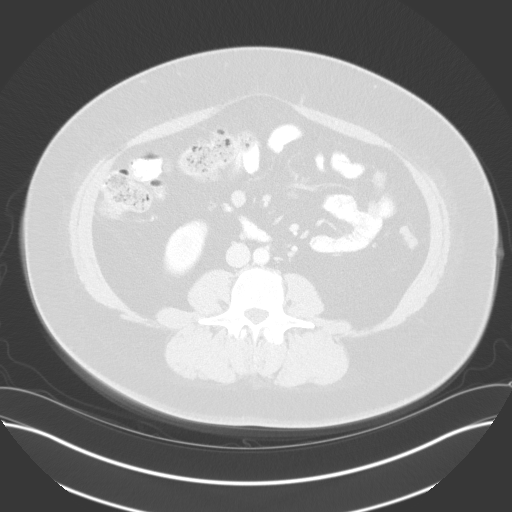
[im 66/131  lung]
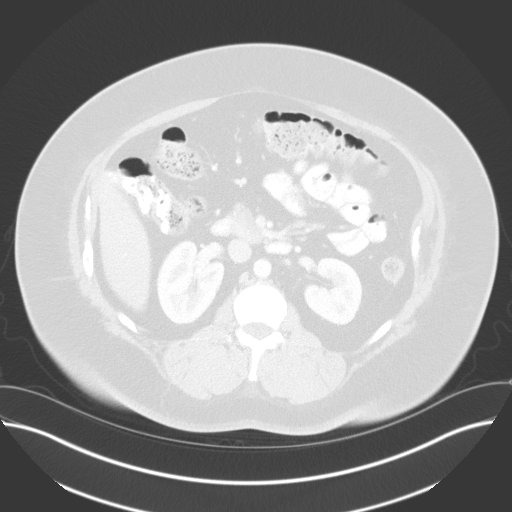
[im 76/131  lung]
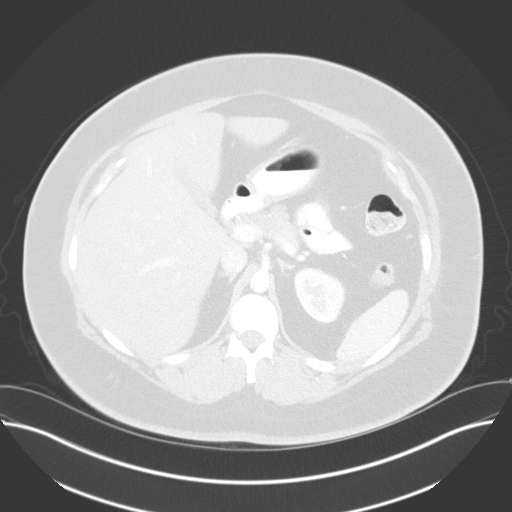
[im 87/131  lung]
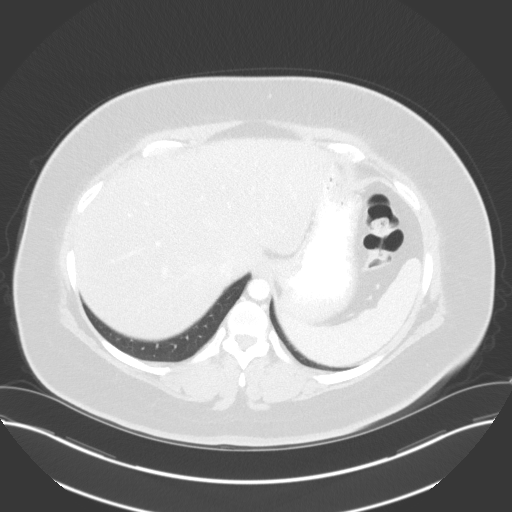
[im 98/131  mediastinal]
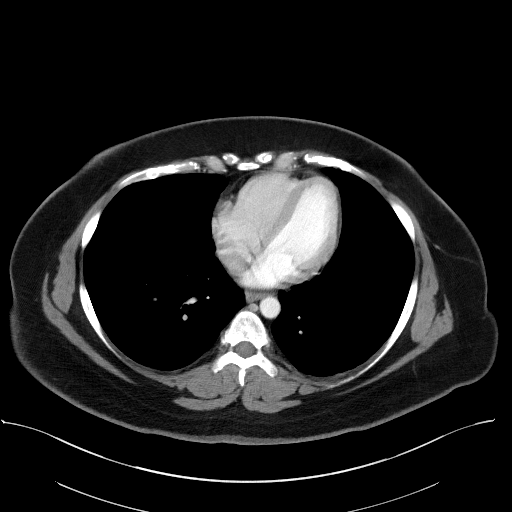
[im 98/131  lung]
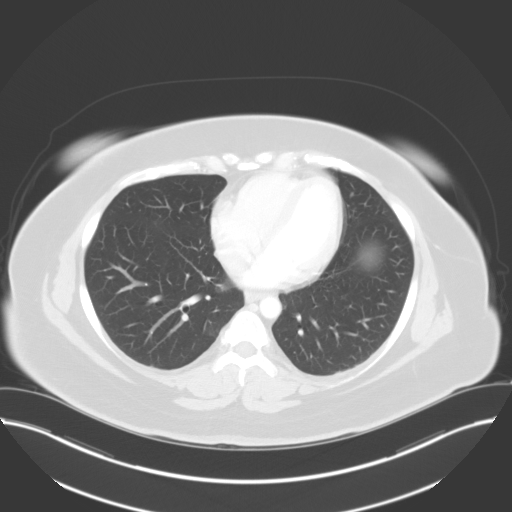
[im 109/131  lung]
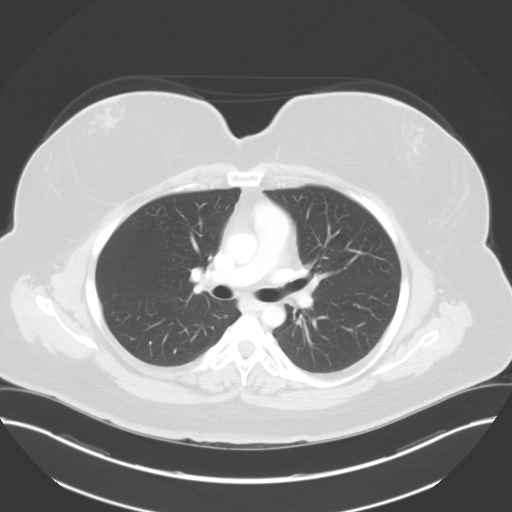
[im 120/131  lung]
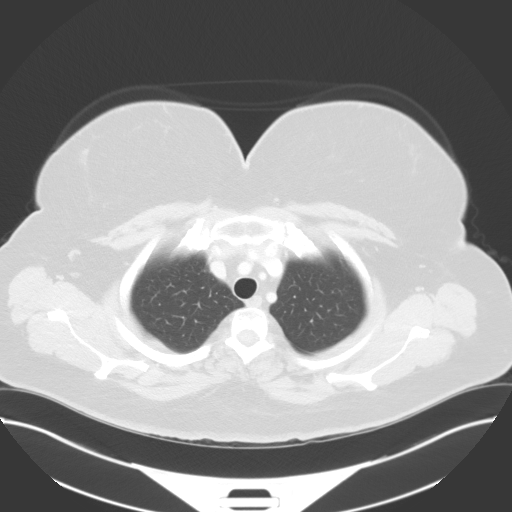

[Series 6: coronals · coronal · 0.87mm/px · 3 of 163 slices shown]
[im 33/163  lung]
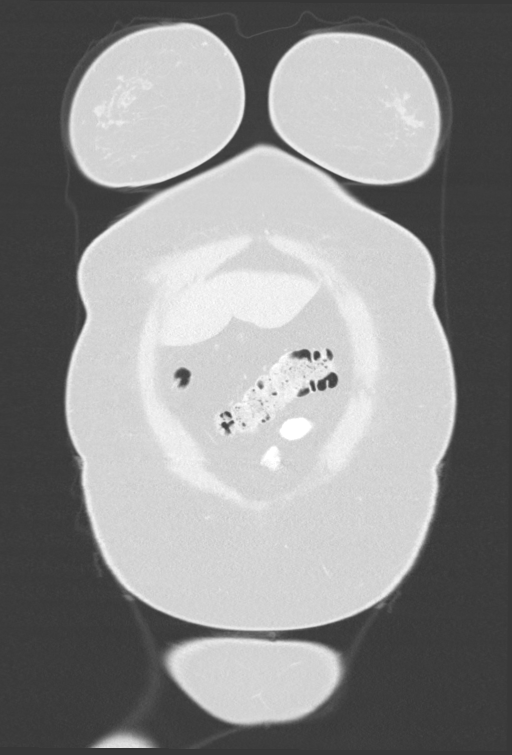
[im 65/163  lung]
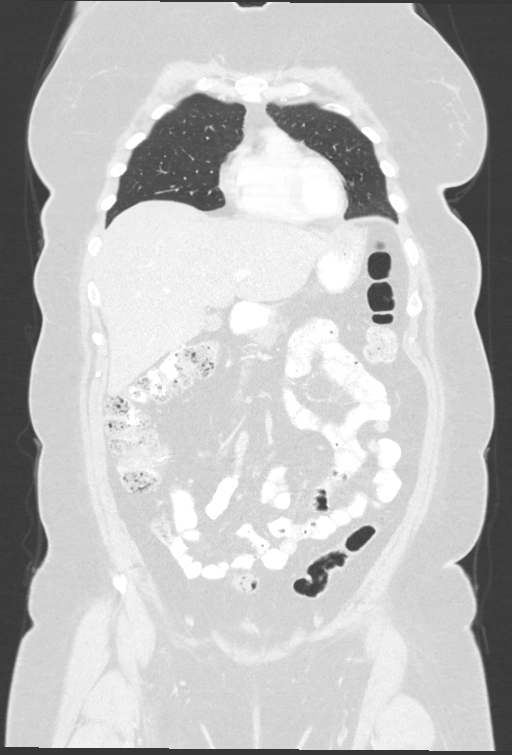
[im 98/163  lung]
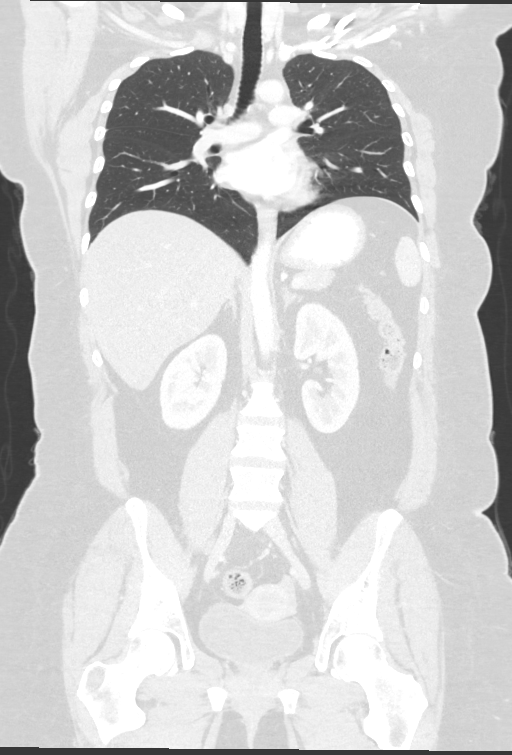

[14 of 36 positions shown; findings below may reference images not displayed]

FINDINGS: CT CHEST FINDINGS

Cardiovascular: No significant vascular findings. Normal heart size.
No pericardial effusion.

Mediastinum/Nodes: No enlarged mediastinal, hilar, or axillary lymph
nodes. There is no appreciable residual soft tissue within the
anterior mediastinum or AP window (series 508, image 18). Thyroid
gland, trachea, and esophagus demonstrate no significant findings.

Lungs/Pleura: Very minimal residual post radiation scarring of the
medial left upper lobe (series 511, image 42). No pleural effusion
or pneumothorax.

Musculoskeletal: No chest wall mass or suspicious bone lesions
identified.

CT ABDOMEN PELVIS FINDINGS

Hepatobiliary: No solid liver abnormality is seen. Hepatic
steatosis. No gallstones, gallbladder wall thickening, or biliary
dilatation.

Pancreas: Unremarkable. No pancreatic ductal dilatation or
surrounding inflammatory changes.

Spleen: Normal in size without significant abnormality.

Adrenals/Urinary Tract: Adrenal glands are unremarkable. Kidneys are
normal, without renal calculi, solid lesion, or hydronephrosis.
Bladder is unremarkable.

Stomach/Bowel: Stomach is within normal limits. Appendix appears
normal. No evidence of bowel wall thickening, distention, or
inflammatory changes.

Vascular/Lymphatic: No significant vascular findings are present. No
enlarged abdominal or pelvic lymph nodes.

Reproductive: No mass or other abnormality.

Other: No abdominal wall hernia or abnormality. No abdominopelvic
ascites.

Musculoskeletal: No acute or significant osseous findings.
IMPRESSION: 1. No appreciable residual soft tissue within the anterior
mediastinum or AP window.
2. No evidence of lymphadenopathy within the chest, abdomen, or
pelvis.
3. Very minimal residual post radiation scarring of the medial left
upper lobe.
4. Hepatic steatosis.

## 2021-03-04 ENCOUNTER — Encounter: Payer: Self-pay | Admitting: Oncology

## 2021-03-16 ENCOUNTER — Ambulatory Visit: Payer: Commercial Managed Care - PPO | Admitting: Sports Medicine

## 2021-03-24 ENCOUNTER — Ambulatory Visit: Payer: Commercial Managed Care - PPO | Admitting: Sports Medicine

## 2021-04-13 ENCOUNTER — Ambulatory Visit (INDEPENDENT_AMBULATORY_CARE_PROVIDER_SITE_OTHER): Payer: Commercial Managed Care - PPO | Admitting: Sports Medicine

## 2021-04-13 ENCOUNTER — Other Ambulatory Visit: Payer: Self-pay

## 2021-04-13 ENCOUNTER — Encounter: Payer: Self-pay | Admitting: Sports Medicine

## 2021-04-13 ENCOUNTER — Ambulatory Visit (INDEPENDENT_AMBULATORY_CARE_PROVIDER_SITE_OTHER): Payer: Commercial Managed Care - PPO

## 2021-04-13 DIAGNOSIS — M7661 Achilles tendinitis, right leg: Secondary | ICD-10-CM | POA: Diagnosis not present

## 2021-04-13 DIAGNOSIS — L732 Hidradenitis suppurativa: Secondary | ICD-10-CM | POA: Insufficient documentation

## 2021-04-13 DIAGNOSIS — M778 Other enthesopathies, not elsewhere classified: Secondary | ICD-10-CM | POA: Diagnosis not present

## 2021-04-13 DIAGNOSIS — M779 Enthesopathy, unspecified: Secondary | ICD-10-CM

## 2021-04-13 DIAGNOSIS — M216X1 Other acquired deformities of right foot: Secondary | ICD-10-CM

## 2021-04-13 DIAGNOSIS — M79671 Pain in right foot: Secondary | ICD-10-CM | POA: Diagnosis not present

## 2021-04-13 MED ORDER — PREDNISONE 10 MG (21) PO TBPK: ORAL_TABLET | ORAL | 0 refills | Status: DC

## 2021-04-13 NOTE — Progress Notes (Signed)
Subjective: Misty Combs is a 35 y.o. female patient who presents to office for evaluation of Right heel pain for the last year states that the pain on average is 4 out of 10 sharp in nature with some pain that radiates up the back of the leg pain is worse with very flat shoes including flip-flops has tried stretching and over-the-counter topical with no complete relief in symptoms.  Patient does admits to a history of Hodgkin's lymphoma completed chemo and was given Elavil and gabapentin for occasional nerve tingling.  Patient denies any other pedal complaints at this time.  Patient Active Problem List   Diagnosis Date Noted   Hidradenitis suppurativa 04/13/2021   Heart rate fast 04/15/2020   S/P chemotherapy, time since greater than 12 weeks 04/15/2020   Palpitations 04/10/2020   Hodgkin's lymphoma ([place]) 05/13/2018   Lung mass    Chronic sinusitis    Asthma, persistent controlled 11/27/2012    Current Outpatient Medications on File Prior to Visit  Medication Sig Dispense Refill   gabapentin (NEURONTIN) 300 MG capsule gabapentin 300 mg capsule     montelukast (SINGULAIR) 10 MG tablet montelukast 10 mg tablet  TAKE 1 TABLET BY MOUTH EVERY EVENING BEFORE BEDTIME     spironolactone (ALDACTONE) 50 MG tablet spironolactone 50 mg tablet     albuterol (PROVENTIL HFA;VENTOLIN HFA) 108 (90 BASE) MCG/ACT inhaler Inhale into the lungs every 6 (six) hours as needed for wheezing or shortness of breath.     ALPRAZolam (XANAX) 0.25 MG tablet alprazolam 0.25 mg tablet  1 TABLET ONCE TO TWICE A DAY AS NEEDED FOR ANXIETY.     amitriptyline (ELAVIL) 50 MG tablet Take 50 mg by mouth at bedtime.     atorvastatin (LIPITOR) 20 MG tablet Take 20 mg by mouth at bedtime.     azelastine (ASTELIN) 0.1 % nasal spray INSTILL 2 (TWO) SPRAY IN EACH NOSTRIL 2 TIMES PER DAY, MAX DAILY DOSE: 4 SPRAY     carvedilol (COREG) 3.125 MG tablet Take 3.125 mg by mouth 2 (two) times daily.     EPINEPHrine 0.3 mg/0.3 mL  IJ SOAJ injection epinephrine 0.3 mg/0.3 mL injection, auto-injector     furosemide (LASIX) 20 MG tablet Take 20 mg by mouth daily as needed.     ketoconazole (NIZORAL) 2 % shampoo PLEASE SEE ATTACHED FOR DETAILED DIRECTIONS     levocetirizine (XYZAL) 5 MG tablet Take 5 mg by mouth daily.     metroNIDAZOLE (METROCREAM) 0.75 % cream metronidazole 0.75 % topical cream  APPLY A SMALL AMOUNT TO AFFECTED AREA TWICE DAILY AS NEEDED AXILLAE     Multiple Vitamin (MULTIVITAMIN WITH MINERALS) TABS tablet Take 1 tablet by mouth daily.     omeprazole (PRILOSEC) 20 MG capsule Take 20 mg by mouth daily.     No current facility-administered medications on file prior to visit.    Allergies  Allergen Reactions   Penicillins Anaphylaxis   Elemental Sulfur Nausea And Vomiting    Dizzy   Sulfa Antibiotics Other (See Comments)   Tetanus Toxoids Swelling    Fever and swelling   Gluten Meal Rash    Sensitive to gluten    Objective:  General: Alert and oriented x3 in no acute distress  Dermatology: No open lesions bilateral lower extremities, no webspace macerations, no ecchymosis bilateral, all nails x 10 are well manicured.  Vascular: Dorsalis Pedis and Posterior Tibial pedal pulses 1/4, Capillary Fill Time 3 seconds, + pedal hair growth bilateral, no edema bilateral  lower extremities, Temperature gradient within normal limits.  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   Xrays  Right foot   Impression: Normal osseous mineralization. Joint spaces preserved except at first MPJ where there is mild joint space narrowing. No fracture/dislocation/boney destruction. Calcaneal spur present. Kager's triangle intact with no obliteration. No soft tissue abnormalities or radiopaque foreign bodies.   Assessment and Plan: Problem List Items Addressed This Visit   None Visit Diagnoses     Tendonitis, Achilles, right    -  Primary   Relevant Orders   DG Foot Complete Right   Acquired equinus deformity of right foot       Pain of right heel           -Complete examination performed -Xrays reviewed -Discussed treatement options for Achilles tendinitis with heel spur -Dispensed night splint for patient to use as directed -Dispensed Achilles sleeve for patient to wear during the day to prevent rubbing to the back of the heel and advised patient to be mindful of shoes that could rub or irritate the heel area -Dispensed heel lifts for patient to use as directed -May continue with gentle stretching as directed -Prescribed prednisone Dosepak for patient to take as needed if pain worsens -No improvement will consider short cam boot/MRI/PT/EPAT -Patient to return to office as scheduled in 3 to 4 weeks or sooner if condition worsens.  Landis Martins, DPM

## 2021-04-13 NOTE — Patient Instructions (Addendum)
Shoe List For tennis shoes recommend:  [place] New balance Saucony HOKA Can be purchased at [employer] sports or [employer] arch fit Can be purchased at any major retailers  Vionic  SAS Can be purchased at [employer] or [employer]   For work shoes recommend: [employer] Work [employer] Can be purchased at a variety of places or [employer]   For casual shoes recommend: Oofos Can be purchased at [employer] sports or [employer]  Can be purchased at [employer] or [employer]  For [employer] recommend: [employer] Can be purchased in office/[facility] [employer] Can be purchased at [employer] sports or [employer] Can be purchased at [place] recommend: Custom fitting/Appointment at [employer] and [place]   Achilles Tendinitis  with Rehab Achilles tendinitis is a disorder of the Achilles tendon. The Achilles tendon connects the large calf muscles (Gastrocnemius and Soleus) to the heel bone (calcaneus). This tendon is sometimes called the heel cord. It is important for pushing-off and standing on your toes and is important for walking, running, or jumping. Tendinitis is often caused by overuse and repetitive microtrauma. SYMPTOMS Pain, tenderness, swelling, warmth, and redness may occur over the Achilles tendon even at rest. Pain with pushing off, or flexing or extending the ankle. Pain that is worsened after or during activity. CAUSES  Overuse sometimes seen with rapid increase in exercise programs or in sports requiring running and jumping. Poor physical conditioning (strength and flexibility or endurance). Running sports, especially training running down hills. Inadequate warm-up before practice or play or failure to stretch before participation. Injury to the tendon. PREVENTION  Warm up and stretch before practice or competition. Allow time for adequate rest and recovery  between practices and competition. Keep up conditioning. Keep up ankle and leg flexibility. Improve or keep muscle strength and endurance. Improve cardiovascular fitness. Use proper technique. Use proper equipment (shoes, skates). To help prevent recurrence, taping, protective strapping, or an adhesive bandage may be recommended for several weeks after healing is complete. PROGNOSIS  Recovery may take weeks to several months to heal. Longer recovery is expected if symptoms have been prolonged. Recovery is usually quicker if the inflammation is due to a direct blow as compared with overuse or sudden strain. RELATED COMPLICATIONS  Healing time will be prolonged if the condition is not correctly treated. The injury must be given plenty of time to heal. Symptoms can reoccur if activity is resumed too soon. Untreated, tendinitis may increase the risk of tendon rupture requiring additional time for recovery and possibly surgery. TREATMENT  The first treatment consists of rest anti-inflammatory medication, and ice to relieve the pain. Stretching and strengthening exercises after resolution of pain will likely help reduce the risk of recurrence. Referral to a physical therapist or athletic trainer for further evaluation and treatment may be helpful. A walking boot or cast may be recommended to rest the Achilles tendon. This can help break the cycle of inflammation and microtrauma. Arch supports (orthotics) may be prescribed or recommended by your caregiver as an adjunct to therapy and rest. Surgery to remove the inflamed tendon lining or degenerated tendon tissue is rarely necessary and has shown less than predictable results. MEDICATION  Nonsteroidal anti-inflammatory medications, such as aspirin and ibuprofen, may be used for pain and inflammation relief. Do not take within 7 days before surgery. Take these as directed by your caregiver. Contact your caregiver immediately if any bleeding, stomach  upset, or signs of allergic reaction occur. Other minor pain relievers, such as acetaminophen, may also be used. Pain relievers may be prescribed as necessary by your caregiver. Do not take prescription pain medication for longer than 4 to 7 days. Use only as directed and only as much as you need. Cortisone injections are rarely indicated. Cortisone injections may weaken tendons and predispose to rupture. It is better to give the condition more time to heal than to use them. HEAT AND COLD Cold is used to relieve pain and reduce inflammation for acute and chronic Achilles tendinitis. Cold should be applied for 10 to 15 minutes every 2 to 3 hours for inflammation and pain and immediately after any activity that aggravates your symptoms. Use ice packs or an ice massage. Heat may be used before performing stretching and strengthening activities prescribed by your caregiver. Use a heat pack or a warm soak. SEEK MEDICAL CARE IF: Symptoms get worse or do not improve in 2 weeks despite treatment. New, unexplained symptoms develop. Drugs used in treatment may produce side effects.  EXERCISES:  RANGE OF MOTION (ROM) AND STRETCHING EXERCISES - Achilles Tendinitis  These exercises may help you when beginning to rehabilitate your injury. Your symptoms may resolve with or without further involvement from your physician, physical therapist or athletic trainer. While completing these exercises, remember:  Restoring tissue flexibility helps normal motion to return to the joints. This allows healthier, less painful movement and activity. An effective stretch should be held for at least 30 seconds. A stretch should never be painful. You should only feel a gentle lengthening or release in the stretched tissue.  STRETCH  Gastroc, Standing  Place hands on wall. Extend right / left leg, keeping the front knee somewhat bent. Slightly point your toes inward on your back foot. Keeping your right / left heel on the floor  and your knee straight, shift your weight toward the wall, not allowing your back to arch. You should feel a gentle stretch in the right / left calf. Hold this position for 10 seconds. Repeat 3 times. Complete this stretch 2 times per day.  STRETCH  Soleus, Standing  Place hands on wall. Extend right / left leg, keeping the other knee somewhat bent. Slightly point your toes inward on your back foot. Keep your right / left heel on the floor, bend your back knee, and slightly shift your weight over the back leg so that you feel a gentle stretch deep in your back calf. Hold this position for 10 seconds. Repeat 3 times. Complete this stretch 2 times per day.  STRETCH  Gastrocsoleus, Standing  Note: This exercise can place a lot of stress on your foot and ankle. Please complete this exercise only if specifically instructed by your caregiver.  Place the ball of your right / left foot on a step, keeping your other foot firmly on the same step. Hold on to the wall or a rail for balance. Slowly lift your other foot, allowing your body weight to press your heel down over the edge of the step. You should feel a stretch in your right / left calf. Hold this position for 10 seconds. Repeat this exercise with a slight bend in your knee. Repeat 3 times. Complete this stretch 2 times per day.   STRENGTHENING EXERCISES - Achilles Tendinitis These exercises may help you when beginning to rehabilitate your injury. They may resolve your symptoms with or without further involvement from your physician, physical therapist or athletic trainer.  While completing these exercises, remember:  Muscles can gain both the endurance and the strength needed for everyday activities through controlled exercises. Complete these exercises as instructed by your physician, physical therapist or athletic trainer. Progress the resistance and repetitions only as guided. You may experience muscle soreness or fatigue, but the pain or  discomfort you are trying to eliminate should never worsen during these exercises. If this pain does worsen, stop and make certain you are following the directions exactly. If the pain is still present after adjustments, discontinue the exercise until you can discuss the trouble with your clinician.  STRENGTH - Plantar-flexors  Sit with your right / left leg extended. Holding onto both ends of a rubber exercise band/tubing, loop it around the ball of your foot. Keep a slight tension in the band. Slowly push your toes away from you, pointing them downward. Hold this position for 10 seconds. Return slowly, controlling the tension in the band/tubing. Repeat 3 times. Complete this exercise 2 times per day.   STRENGTH - Plantar-flexors  Stand with your feet shoulder width apart. Steady yourself with a wall or table using as little support as needed. Keeping your weight evenly spread over the width of your feet, rise up on your toes.* Hold this position for 10 seconds. Repeat 3 times. Complete this exercise 2 times per day.  *If this is too easy, shift your weight toward your right / left leg until you feel challenged. Ultimately, you may be asked to do this exercise with your right / left foot only.  STRENGTH  Plantar-flexors, Eccentric  Note: This exercise can place a lot of stress on your foot and ankle. Please complete this exercise only if specifically instructed by your caregiver.  Place the balls of your feet on a step. With your hands, use only enough support from a wall or rail to keep your balance. Keep your knees straight and rise up on your toes. Slowly shift your weight entirely to your right / left toes and pick up your opposite foot. Gently and with controlled movement, lower your weight through your right / left foot so that your heel drops below the level of the step. You will feel a slight stretch in the back of your calf at the end position. Use the healthy leg to help rise up onto  the balls of both feet, then lower weight only on the right / left leg again. Build up to 15 repetitions. Then progress to 3 consecutive sets of 15 repetitions.* After completing the above exercise, complete the same exercise with a slight knee bend (about 30 degrees). Again, build up to 15 repetitions. Then progress to 3 consecutive sets of 15 repetitions.* Perform this exercise 2 times per day.  *When you easily complete 3 sets of 15, your physician, physical therapist or athletic trainer may advise you to add resistance by wearing a backpack filled with additional weight.  STRENGTH - Plantar Flexors, Seated  Sit on a chair that allows your feet to rest flat on the ground. If necessary, sit at the edge of the chair. Keeping your toes firmly on the ground, lift your right / left heel as far as you can without increasing any discomfort in your ankle. Repeat 3 times. Complete this exercise 2 times a day.

## 2021-05-12 ENCOUNTER — Ambulatory Visit (INDEPENDENT_AMBULATORY_CARE_PROVIDER_SITE_OTHER): Payer: Commercial Managed Care - PPO | Admitting: Sports Medicine

## 2021-05-12 DIAGNOSIS — M79671 Pain in right foot: Secondary | ICD-10-CM

## 2021-05-12 DIAGNOSIS — M7661 Achilles tendinitis, right leg: Secondary | ICD-10-CM

## 2021-05-12 DIAGNOSIS — M216X1 Other acquired deformities of right foot: Secondary | ICD-10-CM | POA: Diagnosis not present

## 2021-05-12 NOTE — Progress Notes (Signed)
Subjective: Misty Combs is a 35 y.o. female patient who presents to office for follow up evaluation of Right heel pain. Reports that pain is the same now 5-6/10 worse with walking, pain radiates up the calf no better with stretching, icing, heel lifts, night splint. Did not pick up Prednisone had it in the past for other issues and it did not help the foot. Patient confirms pain has been going on for 1 year. No other issues noted.  Patient Active Problem List   Diagnosis Date Noted   Hidradenitis suppurativa 04/13/2021   Heart rate fast 04/15/2020   S/P chemotherapy, time since greater than 12 weeks 04/15/2020   Palpitations 04/10/2020   Hodgkin's lymphoma ([place]) 05/13/2018   Lung mass    Chronic sinusitis    Asthma, persistent controlled 11/27/2012    Current Outpatient Medications on File Prior to Visit  Medication Sig Dispense Refill   albuterol (PROVENTIL HFA;VENTOLIN HFA) 108 (90 BASE) MCG/ACT inhaler Inhale into the lungs every 6 (six) hours as needed for wheezing or shortness of breath.     ALPRAZolam (XANAX) 0.25 MG tablet alprazolam 0.25 mg tablet  1 TABLET ONCE TO TWICE A DAY AS NEEDED FOR ANXIETY.     amitriptyline (ELAVIL) 50 MG tablet Take 50 mg by mouth at bedtime.     atorvastatin (LIPITOR) 20 MG tablet Take 20 mg by mouth at bedtime.     azelastine (ASTELIN) 0.1 % nasal spray INSTILL 2 (TWO) SPRAY IN EACH NOSTRIL 2 TIMES PER DAY, MAX DAILY DOSE: 4 SPRAY     carvedilol (COREG) 3.125 MG tablet Take 3.125 mg by mouth 2 (two) times daily.     EPINEPHrine 0.3 mg/0.3 mL IJ SOAJ injection epinephrine 0.3 mg/0.3 mL injection, auto-injector     furosemide (LASIX) 20 MG tablet Take 20 mg by mouth daily as needed.     gabapentin (NEURONTIN) 300 MG capsule gabapentin 300 mg capsule     ketoconazole (NIZORAL) 2 % shampoo PLEASE SEE ATTACHED FOR DETAILED DIRECTIONS     levocetirizine (XYZAL) 5 MG tablet Take 5 mg by mouth daily.     metroNIDAZOLE (METROCREAM) 0.75 % cream  metronidazole 0.75 % topical cream  APPLY A SMALL AMOUNT TO AFFECTED AREA TWICE DAILY AS NEEDED AXILLAE     montelukast (SINGULAIR) 10 MG tablet montelukast 10 mg tablet  TAKE 1 TABLET BY MOUTH EVERY EVENING BEFORE BEDTIME     Multiple Vitamin (MULTIVITAMIN WITH MINERALS) TABS tablet Take 1 tablet by mouth daily.     omeprazole (PRILOSEC) 20 MG capsule Take 20 mg by mouth daily.     predniSONE (STERAPRED UNI-PAK 21 TAB) 10 MG (21) TBPK tablet Take as directed 21 tablet 0   spironolactone (ALDACTONE) 50 MG tablet spironolactone 50 mg tablet     No current facility-administered medications on file prior to visit.    Allergies  Allergen Reactions   Penicillins Anaphylaxis   Elemental Sulfur Nausea And Vomiting    Dizzy   Sulfa Antibiotics Other (See Comments)   Tetanus Toxoids Swelling    Fever and swelling   Gluten Meal Rash    Sensitive to gluten    Objective:  General: Alert and oriented x3 in no acute distress  Dermatology: No open lesions bilateral lower extremities, no webspace macerations, no ecchymosis bilateral, all nails x 10 are well manicured.  Vascular: Dorsalis Pedis and Posterior Tibial pedal pulses 1/4, Capillary Fill Time 3 seconds, + pedal hair growth bilateral, no edema bilateral lower extremities, Temperature  gradient within normal limits.  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  Assessment and Plan: Problem List Items Addressed This Visit   None Visit Diagnoses      Tendonitis, Achilles, right    -  Primary   Relevant Orders   MR ANKLE RIGHT WO CONTRAST   Acquired equinus deformity of right foot       Relevant Orders   MR ANKLE RIGHT WO CONTRAST   Pain of right heel       Relevant Orders   MR ANKLE RIGHT WO CONTRAST       -Complete examination performed -Re-Discussed treatement options for Achilles tendinitis with heel spur -Rx MRI for survillence on prognosis of achilles tendon since pain is no better after 1 year -Continue with night splint for patient to use as directed -Continue with Achilles sleeve for patient to wear during the day to prevent rubbing to the back of the heel  and heel lifts -Advised patient that is pain worsens to pick up prednisone Dosepak previously prescribed -Patient to return to office after MRI or sooner if condition worsens.  Landis Martins, DPM

## 2021-06-01 ENCOUNTER — Encounter: Payer: Self-pay | Admitting: Oncology

## 2021-06-02 ENCOUNTER — Encounter: Payer: Self-pay | Admitting: Oncology

## 2021-06-11 ENCOUNTER — Other Ambulatory Visit: Payer: Managed Care, Other (non HMO)

## 2021-07-07 ENCOUNTER — Other Ambulatory Visit: Payer: Self-pay

## 2021-07-07 ENCOUNTER — Ambulatory Visit
Admission: RE | Admit: 2021-07-07 | Discharge: 2021-07-07 | Disposition: A | Discharge status: Home / Self Care | Payer: Managed Care, Other (non HMO) | Source: Ambulatory Visit | Attending: Sports Medicine | Admitting: Sports Medicine

## 2021-07-07 DIAGNOSIS — M79671 Pain in right foot: Secondary | ICD-10-CM

## 2021-07-07 DIAGNOSIS — M216X1 Other acquired deformities of right foot: Secondary | ICD-10-CM

## 2021-07-07 DIAGNOSIS — M7661 Achilles tendinitis, right leg: Secondary | ICD-10-CM

## 2021-07-15 DIAGNOSIS — M25571 Pain in right ankle and joints of right foot: Secondary | ICD-10-CM | POA: Insufficient documentation

## 2021-10-07 ENCOUNTER — Other Ambulatory Visit: Payer: Self-pay

## 2021-10-07 ENCOUNTER — Telehealth: Payer: Self-pay | Admitting: *Deleted

## 2021-10-07 DIAGNOSIS — C8112 Nodular sclerosis classical Hodgkin lymphoma, intrathoracic lymph nodes: Secondary | ICD-10-CM

## 2021-10-07 NOTE — Telephone Encounter (Signed)
Patient called stating she never got her follow up appointment after her visit last year.  Follow-up and Disposition History  07/28/2020 1432 - Misty Huger, MD  Check-out note: 1 year: ct n/c/a/p with contrast. Video visit 1-2 days later.  tjf

## 2021-10-07 NOTE — Telephone Encounter (Signed)
I have put in the CT order. Can you schedule the CT scan and have patient do a virtual visit 1-2 days later with Woodfin Ganja please.

## 2021-11-01 ENCOUNTER — Ambulatory Visit
Admission: RE | Admit: 2021-11-01 | Discharge: 2021-11-01 | Disposition: A | Discharge status: Home / Self Care | Payer: Managed Care, Other (non HMO) | Source: Ambulatory Visit | Attending: Oncology | Admitting: Oncology

## 2021-11-01 DIAGNOSIS — C8112 Nodular sclerosis classical Hodgkin lymphoma, intrathoracic lymph nodes: Secondary | ICD-10-CM | POA: Diagnosis present

## 2021-11-01 HISTORY — DX: Malignant (primary) neoplasm, unspecified: C80.1

## 2021-11-01 LAB — POCT I-STAT CREATININE: Creatinine, Ser: 0.8 mg/dL (ref 0.44–1.00)

## 2021-11-01 MED ORDER — IOHEXOL 300 MG/ML  SOLN
100.0000 mL | Freq: Once | INTRAMUSCULAR | Status: AC | PRN
  Administered 2021-11-01: 100 mL via INTRAVENOUS

## 2021-11-02 NOTE — Progress Notes (Signed)
[facility]  [phone] [phone]  ID: Misty Combs OB: 07/03/1985  MR#: [id number]  DJM#:[id number]  Patient Care Team: Gala Lewandowsky, MD as PCP - General (Family Medicine) Lloyd Huger, MD as Consulting Physician (Oncology) Noreene Filbert, MD as Referring Physician (Radiation Oncology)  I connected with Misty Combs on 11/05/21 at 11:00 AM EDT by video enabled telemedicine visit and verified that I am speaking with the correct person using two identifiers.   I discussed the limitations, risks, security and privacy concerns of performing an evaluation and management service by telemedicine and the availability of in-person appointments. I also discussed with the patient that there may be a patient responsible charge related to this service. The patient expressed understanding and agreed to proceed.   Other persons participating in the visit and their role in the encounter: Patient, MD.  Patient's location: Home. Provider's location: Clinic.  CHIEF COMPLAINT: Stage II Hodgkin's lymphoma.  INTERVAL HISTORY: Patient agreed to video assisted telemedicine visit for routine yearly evaluation and discussion of her imaging results.  She continues to feel well and remains asymptomatic. She has no neurologic complaints.  She denies any fevers, chills, night sweats, or weight loss.  She denies any chest pain, shortness of breath, cough, or hemoptysis.  She denies any nausea, vomiting, constipation, or diarrhea.  She has no urinary complaints.  Patient offers no specific complaints today.  REVIEW OF SYSTEMS:   Review of Systems  Constitutional: Negative.  Negative for fever, malaise/fatigue and weight loss.  Respiratory: Negative.  Negative for cough and shortness of breath.   Cardiovascular: Negative.  Negative for chest pain and leg swelling.  Gastrointestinal: Negative.  Negative for abdominal pain, blood in stool and  melena.  Genitourinary: Negative.  Negative for dysuria and flank pain.  Musculoskeletal: Negative.  Negative for back pain.  Skin: Negative.  Negative for itching and rash.  Neurological: Negative.  Negative for focal weakness, weakness and headaches.  Psychiatric/Behavioral: Negative.  The patient is not nervous/anxious.     As per HPI. Otherwise, a complete review of systems is negative.  PAST MEDICAL HISTORY: Past Medical History:  Diagnosis Date   Asthma    exercise induced   Cancer ([place])    Hypertension    Migraines    no aura   UTI (lower urinary tract infection)    post coital    PAST SURGICAL HISTORY: Past Surgical History:  Procedure Laterality Date   ELECTROMAGNETIC NAVIGATION BROCHOSCOPY Left 05/02/2018   Procedure: ELECTROMAGNETIC NAVIGATION BRONCHOSCOPY;  Surgeon: Flora Lipps, MD;  Location: [facility] ORS;  Service: Cardiopulmonary;  Laterality: Left;   ESOPHAGOGASTRODUODENOSCOPY     IR IMAGING GUIDED PORT INSERTION  05/14/2018   IR REMOVAL TUN ACCESS W/ PORT W/O FL MOD SED  04/26/2019   KNEE SURGERY Left 2016    FAMILY HISTORY: Family History  Problem Relation Age of Onset   Hypertension Mother    Heart disease Father     ADVANCED DIRECTIVES (Y/N):  N  HEALTH MAINTENANCE: Social History   Tobacco Use   Smoking status: Never   Smokeless tobacco: Never  Vaping Use   Vaping Use: Never used  Substance Use Topics   Alcohol use: Yes    Alcohol/week: 2.0 standard drinks of alcohol    Types: 2 Glasses of wine per week   Drug use: No     Colonoscopy:  PAP:  Bone density:  Lipid panel:  Allergies  Allergen Reactions   Penicillins Anaphylaxis  Elemental Sulfur Nausea And Vomiting    Dizzy   Sulfa Antibiotics Other (See Comments)   Tetanus Toxoids Swelling    Fever and swelling   Gluten Meal Rash    Sensitive to gluten    Current Outpatient Medications  Medication Sig Dispense Refill   ALPRAZolam (XANAX) 0.25 MG tablet alprazolam 0.25 mg  tablet  1 TABLET ONCE TO TWICE A DAY AS NEEDED FOR ANXIETY.     amitriptyline (ELAVIL) 50 MG tablet Take 50 mg by mouth at bedtime.     azelastine (ASTELIN) 0.1 % nasal spray INSTILL 2 (TWO) SPRAY IN EACH NOSTRIL 2 TIMES PER DAY, MAX DAILY DOSE: 4 SPRAY     carvedilol (COREG) 3.125 MG tablet Take 3.125 mg by mouth 2 (two) times daily.     furosemide (LASIX) 20 MG tablet Take 20 mg by mouth daily as needed.     gabapentin (NEURONTIN) 300 MG capsule gabapentin 300 mg capsule     ketoconazole (NIZORAL) 2 % shampoo PLEASE SEE ATTACHED FOR DETAILED DIRECTIONS     levocetirizine (XYZAL) 5 MG tablet Take 5 mg by mouth daily.     metroNIDAZOLE (METROCREAM) 0.75 % cream metronidazole 0.75 % topical cream  APPLY A SMALL AMOUNT TO AFFECTED AREA TWICE DAILY AS NEEDED AXILLAE     montelukast (SINGULAIR) 10 MG tablet montelukast 10 mg tablet  TAKE 1 TABLET BY MOUTH EVERY EVENING BEFORE BEDTIME     Multiple Vitamin (MULTIVITAMIN WITH MINERALS) TABS tablet Take 1 tablet by mouth daily.     omeprazole (PRILOSEC) 20 MG capsule Take 20 mg by mouth daily.     spironolactone (ALDACTONE) 50 MG tablet spironolactone 50 mg tablet     EPINEPHrine 0.3 mg/0.3 mL IJ SOAJ injection epinephrine 0.3 mg/0.3 mL injection, auto-injector (Patient not taking: Reported on 11/04/2021)     No current facility-administered medications for this visit.    OBJECTIVE: There were no vitals filed for this visit.   There is no height or weight on file to calculate BMI.    ECOG FS:0 - Asymptomatic  General: Well-developed, well-nourished, no acute distress. HEENT: Normocephalic. Neuro: Alert, answering all questions appropriately. Cranial nerves grossly intact. Psych: Normal affect.  LAB RESULTS:  Lab Results  Component Value Date   NA 136 01/28/2020   K 4.3 01/28/2020   CL 102 01/28/2020   CO2 26 01/28/2020   GLUCOSE 109 (H) 01/28/2020   BUN 14 01/28/2020   CREATININE 0.80 11/01/2021   CALCIUM 8.9 01/28/2020   PROT 7.6  01/28/2020   ALBUMIN 4.0 01/28/2020   AST 20 01/28/2020   ALT 33 01/28/2020   ALKPHOS 79 01/28/2020   BILITOT 0.6 01/28/2020   GFRNONAA >60 01/28/2020   GFRAA >60 01/28/2020    Lab Results  Component Value Date   WBC 7.9 01/28/2020   NEUTROABS 5.7 01/28/2020   HGB 13.3 01/28/2020   HCT 39.2 01/28/2020   MCV 89.3 01/28/2020   PLT 319 01/28/2020     STUDIES: CT CHEST ABDOMEN PELVIS W CONTRAST  Result Date: 11/02/2021 CLINICAL DATA:  Nodular sclerosing Hodgkin's lymphoma, remission status post chemo radiation, restaging * Tracking Code: BO * EXAM: CT CHEST, ABDOMEN, AND PELVIS WITH CONTRAST TECHNIQUE: Multidetector CT imaging of the chest, abdomen and pelvis was performed following the standard protocol during bolus administration of intravenous contrast. RADIATION DOSE REDUCTION: This exam was performed according to the departmental dose-optimization program which includes automated exposure control, adjustment of the mA and/or kV according to patient size and/or use  of iterative reconstruction technique. CONTRAST:  156m OMNIPAQUE IOHEXOL 300 MG/ML SOLN, additional oral enteric contrast COMPARISON:  07/23/2020 FINDINGS: CT CHEST FINDINGS Cardiovascular: No significant vascular findings. Normal heart size. No pericardial effusion. Mediastinum/Nodes: No enlarged mediastinal, hilar, or axillary lymph nodes. No residual or recurrent anterior mediastinal mass. Thyroid gland, trachea, and esophagus demonstrate no significant findings. Lungs/Pleura: Lungs are clear. No pleural effusion or pneumothorax. Musculoskeletal: No chest wall mass or suspicious osseous lesions identified. CT ABDOMEN PELVIS FINDINGS Hepatobiliary: No solid liver abnormality is seen. Hepatic steatosis. No gallstones, gallbladder wall thickening, or biliary dilatation. Pancreas: Unremarkable. No pancreatic ductal dilatation or surrounding inflammatory changes. Spleen: Normal in size without significant abnormality.  Adrenals/Urinary Tract: Adrenal glands are unremarkable. Kidneys are normal, without renal calculi, solid lesion, or hydronephrosis. Bladder is unremarkable. Stomach/Bowel: Stomach is within normal limits. Appendix appears normal. No evidence of bowel wall thickening, distention, or inflammatory changes. Vascular/Lymphatic: No significant vascular findings are present. No enlarged abdominal or pelvic lymph nodes. Reproductive: No mass or other abnormality. Benign, functional left ovarian cyst measuring 3.8 cm. No follow-up imaging recommended. Note: This recommendation does not apply to premenarchal patients and to those with increased risk (genetic, family history, elevated tumor markers or other high-risk factors) of ovarian cancer. Reference: JACR 2020 Feb; 17(2):248-254 Other: No abdominal wall hernia or abnormality. No ascites. Musculoskeletal: No acute osseous findings. IMPRESSION: 1. No residual or recurrent anterior mediastinal mass. No lymphadenopathy in the chest, abdomen, or pelvis. 2. Hepatic steatosis. Electronically Signed   By: ADelanna AhmadiM.D.   On: 11/02/2021 09:48     ASSESSMENT: Stage II Hodgkin's lymphoma.  PLAN:    1.  Stage II Hodgkin's lymphoma: No evidence of disease.  Patient completed chemotherapy with ABVD on August 27, 2018.  She completed XRT to her mediastinal mass at the end of May 2020.  Her most recent imaging with CT scan on November 02, 2021 reviewed independently and reported as above with no obvious evidence of recurrent or progressive disease.  No intervention is needed at this time.  Continue yearly imaging and evaluation until 2025 when patient is 5 years removed from completing treatment.    I provided 20 minutes of face-to-face video visit time during this encounter which included chart review, counseling, and coordination of care as documented above.   Patient expressed understanding and was in agreement with this plan. She also understands that She can call clinic  at any time with any questions, concerns, or complaints.    Cancer Staging  Hodgkin's lymphoma ([facility] Staging form: Hodgkin and Non-Hodgkin Lymphoma, AJCC 8th Edition - Clinical stage from 05/13/2018: Stage II bulky (Hodgkin lymphoma, A - Asymptomatic) - Signed by FLloyd Huger MD on 05/13/2018   TLloyd Huger MD   11/05/2021 9:42 AM

## 2021-11-04 ENCOUNTER — Inpatient Hospital Stay: Payer: Managed Care, Other (non HMO) | Attending: Oncology | Admitting: Oncology

## 2021-11-04 ENCOUNTER — Encounter: Payer: Self-pay | Admitting: Oncology

## 2021-11-04 DIAGNOSIS — C8112 Nodular sclerosis classical Hodgkin lymphoma, intrathoracic lymph nodes: Secondary | ICD-10-CM | POA: Diagnosis not present

## 2021-11-05 ENCOUNTER — Encounter: Payer: Self-pay | Admitting: Oncology

## 2022-11-04 ENCOUNTER — Ambulatory Visit
Admission: RE | Admit: 2022-11-04 | Discharge: 2022-11-04 | Disposition: A | Discharge status: Home / Self Care | Payer: Managed Care, Other (non HMO) | Source: Ambulatory Visit | Attending: Oncology | Admitting: Oncology

## 2022-11-04 DIAGNOSIS — C8112 Nodular sclerosis classical Hodgkin lymphoma, intrathoracic lymph nodes: Secondary | ICD-10-CM

## 2022-11-04 MED ORDER — BARIUM SULFATE 2 % PO SUSP: 450.0000 mL | Freq: Once | ORAL | Status: DC

## 2022-11-04 MED ORDER — IOHEXOL 300 MG/ML  SOLN
100.0000 mL | Freq: Once | INTRAMUSCULAR | Status: AC | PRN
  Administered 2022-11-04: 100 mL via INTRAVENOUS

## 2022-11-04 NOTE — Addendum Note (Signed)
Encounter addended by: Elenor Quinones V on: 11/04/2022 10:00 AM  Actions taken: Imaging Exam ended

## 2022-11-08 ENCOUNTER — Inpatient Hospital Stay: Payer: Managed Care, Other (non HMO) | Attending: Oncology | Admitting: Oncology

## 2022-11-08 ENCOUNTER — Encounter: Payer: Self-pay | Admitting: Oncology

## 2022-11-08 VITALS — BP 114/63 | HR 105 | Temp 98.6°F | Resp 16 | Ht 65.0 in | Wt 245.8 lb

## 2022-11-08 DIAGNOSIS — C8190 Hodgkin lymphoma, unspecified, unspecified site: Secondary | ICD-10-CM | POA: Diagnosis present

## 2022-11-08 DIAGNOSIS — C8112 Nodular sclerosis classical Hodgkin lymphoma, intrathoracic lymph nodes: Secondary | ICD-10-CM | POA: Diagnosis not present

## 2022-11-08 NOTE — Progress Notes (Unsigned)
[facility]  [phone] [phone]  ID: Misty Combs OB: [birth year]  MR#: [id number]  XBM#:[id number]  Patient Care Team: Maris Berger, MD as PCP - General (Family Medicine) Jeralyn Ruths, MD as Consulting Physician (Oncology) Carmina Miller, MD as Referring Physician (Radiation Oncology)   CHIEF COMPLAINT: Stage II Hodgkin's lymphoma.  INTERVAL HISTORY: Patient returns to clinic today for routine yearly evaluation and discussion of her imaging results.  She continues to feel well and remains asymptomatic. She has no neurologic complaints.  She denies any fevers, chills, night sweats, or weight loss.  She denies any chest pain, shortness of breath, cough, or hemoptysis.  She denies any nausea, vomiting, constipation, or diarrhea.  She has no urinary complaints.  Patient feels at her baseline offers no specific complaints today.  REVIEW OF SYSTEMS:   Review of Systems  Constitutional: Negative.  Negative for fever, malaise/fatigue and weight loss.  Respiratory: Negative.  Negative for cough and shortness of breath.   Cardiovascular: Negative.  Negative for chest pain and leg swelling.  Gastrointestinal: Negative.  Negative for abdominal pain, blood in stool and melena.  Genitourinary: Negative.  Negative for dysuria and flank pain.  Musculoskeletal: Negative.  Negative for back pain.  Skin: Negative.  Negative for itching and rash.  Neurological: Negative.  Negative for focal weakness, weakness and headaches.  Psychiatric/Behavioral: Negative.  The patient is not nervous/anxious.     As per HPI. Otherwise, a complete review of systems is negative.  PAST MEDICAL HISTORY: Past Medical History:  Diagnosis Date   Asthma    exercise induced   Cancer (HCC)    Hypertension    Migraines    no aura   UTI (lower urinary tract infection)    post coital    PAST SURGICAL HISTORY: Past Surgical History:  Procedure  Laterality Date   ELECTROMAGNETIC NAVIGATION BROCHOSCOPY Left 05/02/2018   Procedure: ELECTROMAGNETIC NAVIGATION BRONCHOSCOPY;  Surgeon: Erin Fulling, MD;  Location: [facility] ORS;  Service: Cardiopulmonary;  Laterality: Left;   ESOPHAGOGASTRODUODENOSCOPY     IR IMAGING GUIDED PORT INSERTION  05/14/2018   IR REMOVAL TUN ACCESS W/ PORT W/O FL MOD SED  04/26/2019   KNEE SURGERY Left 2016    FAMILY HISTORY: Family History  Problem Relation Age of Onset   Hypertension Mother    Heart disease Father     ADVANCED DIRECTIVES (Y/N):  N  HEALTH MAINTENANCE: Social History   Tobacco Use   Smoking status: Never   Smokeless tobacco: Never  Vaping Use   Vaping Use: Never used  Substance Use Topics   Alcohol use: Yes    Alcohol/week: 2.0 standard drinks of alcohol    Types: 2 Glasses of wine per week   Drug use: No     Colonoscopy:  PAP:  Bone density:  Lipid panel:  Allergies  Allergen Reactions   Penicillins Anaphylaxis   Elemental Sulfur Nausea And Vomiting    Dizzy   Sulfa Antibiotics Other (See Comments)   Tetanus Toxoids Swelling    Fever and swelling   Gluten Meal Rash    Sensitive to gluten    Current Outpatient Medications  Medication Sig Dispense Refill   ALPRAZolam (XANAX) 0.25 MG tablet alprazolam 0.25 mg tablet  1 TABLET ONCE TO TWICE A DAY AS NEEDED FOR ANXIETY.     amitriptyline (ELAVIL) 50 MG tablet Take 50 mg by mouth at bedtime.     azelastine (ASTELIN) 0.1 % nasal spray INSTILL 2 (TWO) SPRAY  IN EACH NOSTRIL 2 TIMES PER DAY, MAX DAILY DOSE: 4 SPRAY     furosemide (LASIX) 20 MG tablet Take 20 mg by mouth daily as needed.     gabapentin (NEURONTIN) 300 MG capsule gabapentin 300 mg capsule     ketoconazole (NIZORAL) 2 % shampoo PLEASE SEE ATTACHED FOR DETAILED DIRECTIONS     levocetirizine (XYZAL) 5 MG tablet Take 5 mg by mouth daily.     metroNIDAZOLE (METROCREAM) 0.75 % cream metronidazole 0.75 % topical cream  APPLY A SMALL AMOUNT TO AFFECTED AREA TWICE  DAILY AS NEEDED AXILLAE     montelukast (SINGULAIR) 10 MG tablet montelukast 10 mg tablet  TAKE 1 TABLET BY MOUTH EVERY EVENING BEFORE BEDTIME     Multiple Vitamin (MULTIVITAMIN WITH MINERALS) TABS tablet Take 1 tablet by mouth daily.     omeprazole (PRILOSEC) 20 MG capsule Take 20 mg by mouth daily.     spironolactone (ALDACTONE) 50 MG tablet spironolactone 50 mg tablet     EPINEPHrine 0.3 mg/0.3 mL IJ SOAJ injection epinephrine 0.3 mg/0.3 mL injection, auto-injector (Patient not taking: Reported on 11/04/2021)     No current facility-administered medications for this visit.    OBJECTIVE: Vitals:   11/08/22 1439  BP: 114/63  Pulse: (!) 105  Resp: 16  Temp: 98.6 F (37 C)  SpO2: 99%     Body mass index is 40.9 kg/m.    ECOG FS:0 - Asymptomatic  General: Well-developed, well-nourished, no acute distress. Eyes: Pink conjunctiva, anicteric sclera. HEENT: Normocephalic, moist mucous membranes. Lungs: No audible wheezing or coughing. Heart: Regular rate and rhythm. Abdomen: Soft, nontender, no obvious distention. Musculoskeletal: No edema, cyanosis, or clubbing. Neuro: Alert, answering all questions appropriately. Cranial nerves grossly intact. Skin: No rashes or petechiae noted. Psych: Normal affect.  LAB RESULTS:  Lab Results  Component Value Date   NA 136 01/28/2020   K 4.3 01/28/2020   CL 102 01/28/2020   CO2 26 01/28/2020   GLUCOSE 109 (H) 01/28/2020   BUN 14 01/28/2020   CREATININE 0.80 11/01/2021   CALCIUM 8.9 01/28/2020   PROT 7.6 01/28/2020   ALBUMIN 4.0 01/28/2020   AST 20 01/28/2020   ALT 33 01/28/2020   ALKPHOS 79 01/28/2020   BILITOT 0.6 01/28/2020   GFRNONAA >60 01/28/2020   GFRAA >60 01/28/2020    Lab Results  Component Value Date   WBC 7.9 01/28/2020   NEUTROABS 5.7 01/28/2020   HGB 13.3 01/28/2020   HCT 39.2 01/28/2020   MCV 89.3 01/28/2020   PLT 319 01/28/2020     STUDIES: CT CHEST ABDOMEN PELVIS W CONTRAST  Result Date:  11/04/2022 CLINICAL DATA:  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 RADIATION DOSE REDUCTION: This exam was performed according to the departmental dose-optimization program which includes automated exposure control, adjustment of the mA and/or kV according to patient size and/or use of iterative reconstruction technique. CONTRAST:  OMNIPAQUE IOHEXOL 300 MG/ML  SOLN COMPARISON:  11/01/2021 FINDINGS: CT CHEST FINDINGS Cardiovascular: No significant vascular findings. Normal heart size. No pericardial effusion. Mediastinum/Nodes: No enlarged mediastinal, hilar, or axillary lymph nodes. Thyroid gland, trachea, and esophagus demonstrate no significant findings. Lungs/Pleura: Lungs are clear. No pleural effusion or pneumothorax. Musculoskeletal: No chest wall abnormality. No acute osseous findings. CT ABDOMEN PELVIS FINDINGS Hepatobiliary:  No solid liver abnormality is seen. Simple, benign subcentimeter left liver cyst, for which no further follow-up or characterization is required. Hepatic steatosis. No gallstones, gallbladder wall thickening, or biliary dilatation. Pancreas: Unremarkable. No pancreatic ductal dilatation or surrounding inflammatory changes. Spleen: Mild splenomegaly, maximum span 13.3 cm, unchanged. Adrenals/Urinary Tract: Adrenal glands are unremarkable. Kidneys are normal, without renal calculi, solid lesion, or hydronephrosis. Bladder is unremarkable. Stomach/Bowel: Stomach is within normal limits. Appendix appears normal. No evidence of bowel wall thickening, distention, or inflammatory changes. Vascular/Lymphatic: No significant vascular findings are present. No enlarged abdominal or pelvic lymph nodes. Reproductive: No mass  or other abnormality. Diminished size of a left ovarian cyst, consistent with a benign functional cysts, for which no further follow-up or characterization is required. Other: No abdominal wall hernia or abnormality. No ascites. Musculoskeletal: No acute osseous findings. IMPRESSION: 1. No mass or lymphadenopathy in the chest, abdomen, or pelvis. 2. Hepatic steatosis. Electronically Signed   By: Jearld Lesch M.D.   On: 11/04/2022 10:56     ASSESSMENT: Stage II Hodgkin's lymphoma.  PLAN:    Stage II Hodgkin's lymphoma: No evidence of disease.  Patient completed chemotherapy with ABVD on August 27, 2018.  She completed XRT to her mediastinal mass at the end of May 2020.  Her most recent CT scan on November 04, 2022 reviewed independently and report as above with no obvious evidence of recurrent or progressive disease.  No intervention is needed at this time.  Patient will return to clinic in 1 year with repeat imaging and further evaluation.  If everything remains in remission, she likely can be discharged from clinic.   I spent a total of 20 minutes reviewing chart data, face-to-face evaluation with the patient, counseling and coordination of care as detailed above.   Patient expressed understanding and was in agreement with this plan. She also understands that She can call clinic at any time with any questions, concerns, or complaints.    Cancer Staging  Hodgkin's lymphoma [facility]) Staging form: Hodgkin and Non-Hodgkin Lymphoma, AJCC 8th Edition - Clinical stage from 05/13/2018: Stage II bulky (Hodgkin lymphoma, A - Asymptomatic) - Signed by Jeralyn Ruths, MD on 05/13/2018   Jeralyn Ruths, MD   11/09/2022 2:31 PM

## 2022-11-09 ENCOUNTER — Encounter: Payer: Self-pay | Admitting: Oncology

## 2023-07-04 ENCOUNTER — Ambulatory Visit: Payer: Managed Care, Other (non HMO) | Admitting: Allergy

## 2023-07-04 ENCOUNTER — Encounter: Payer: Self-pay | Admitting: Allergy

## 2023-07-04 VITALS — BP 106/64 | HR 97 | Temp 98.1°F | Resp 16 | Ht 64.5 in | Wt 233.0 lb

## 2023-07-04 DIAGNOSIS — K9049 Malabsorption due to intolerance, not elsewhere classified: Secondary | ICD-10-CM | POA: Diagnosis not present

## 2023-07-04 DIAGNOSIS — H1013 Acute atopic conjunctivitis, bilateral: Secondary | ICD-10-CM | POA: Diagnosis not present

## 2023-07-04 DIAGNOSIS — J3089 Other allergic rhinitis: Secondary | ICD-10-CM

## 2023-07-04 DIAGNOSIS — J45990 Exercise induced bronchospasm: Secondary | ICD-10-CM

## 2023-07-04 DIAGNOSIS — J302 Other seasonal allergic rhinitis: Secondary | ICD-10-CM

## 2023-07-04 MED ORDER — ALBUTEROL SULFATE HFA 108 (90 BASE) MCG/ACT IN AERS: 2.0000 | INHALATION_SPRAY | RESPIRATORY_TRACT | 2 refills | Status: DC | PRN

## 2023-07-04 MED ORDER — RYALTRIS 665-25 MCG/ACT NA SUSP: 2.0000 | Freq: Two times a day (BID) | NASAL | 5 refills | Status: AC

## 2023-07-04 MED ORDER — MONTELUKAST SODIUM 10 MG PO TABS: 10.0000 mg | ORAL_TABLET | Freq: Every day | ORAL | 5 refills | Status: DC

## 2023-07-04 NOTE — Progress Notes (Signed)
New Patient Note  RE: Misty Combs MRN: [id number] DOB: 10/08/1985 Date of Office Visit: 07/04/2023  Primary care provider: Maris Berger, MD  Chief Complaint: Allergies  History of present illness: Misty Combs is a 38 y.o. female presenting today for evaluation of allergies. Discussed the use of AI scribe software for clinical note transcription with the patient, who gave verbal consent to proceed. Odessa Fleming is a send yes  She has a history of environmental allergies and has been to multiple allergists. Her last allergist relocated about a year ago.  She states her last allergy testing but has been around 2023. Previous tests indicated sensitivity to multiple environmental allergens, resulting in significant nasal drainage and other symptoms. She was on allergy immunotherapy for a year and three months, which she did find beneficial, but she discontinued due to scheduling conflicts.  Her current symptoms include nasal drainage, congestion, itchy and watery eyes, sneezing, and coughing, which are exacerbated in certain seasons, particularly in October and April [place] present year-round. She experienced an unusual sinus infection in December; she states she will normally have a sinus infection around April and October. She uses Xyzal daily for years, which she finds effective still, but has run out of azelastine nasal spray that she is to use. She also has use Flonase and Pataday extra strength eye drops, though the latter is sometimes insufficient and she will use a different eyedrop in the day as needed.  She states she did take Singulair in the past when she was seeing her previous allergist and she feels like it was helpful as well.  She has a history of asthma, primarily exercise-induced, causing sharp chest pain during running or excessive exercise. She has not been on asthma medication recently and does not currently use an inhaler, though she would like to  have Proair available.  She has seen a pulmonologist Dr. Prescott Parma for asthma management and was in the past on Breo and Trelegy.  She reports gluten sensitivity with symptoms akin to celiac disease, although tests were negative for celiac disease. She avoids gluten, shellfish, and tree nuts due to gastrointestinal discomfort. She previously had an EpiPen during allergy shots but has not required it for food-related issues.      Review of systems: 10pt ROS negative unless noted above in HPI  All other systems negative unless noted above in HPI  Past medical history: Past Medical History:  Diagnosis Date   Asthma    exercise induced   Cancer (HCC)    Hypertension    Migraines    no aura   UTI (lower urinary tract infection)    post coital    Past surgical history: Past Surgical History:  Procedure Laterality Date   ELECTROMAGNETIC NAVIGATION BROCHOSCOPY Left 05/02/2018   Procedure: ELECTROMAGNETIC NAVIGATION BRONCHOSCOPY;  Surgeon: Erin Fulling, MD;  Location: [facility] ORS;  Service: Cardiopulmonary;  Laterality: Left;   ESOPHAGOGASTRODUODENOSCOPY     IR IMAGING GUIDED PORT INSERTION  05/14/2018   IR REMOVAL TUN ACCESS W/ PORT W/O FL MOD SED  04/26/2019   KNEE SURGERY Left 2016    Family history:  Family History  Problem Relation Age of Onset   Allergic rhinitis Mother    Eczema Mother    Hypertension Mother    Food Allergy Mother        wheat, dairy   Sinusitis Mother    Asthma Father    Heart disease Father    COPD Father  Asthma Brother    Asthma Maternal Grandmother    Urticaria Neg Hx    Immunodeficiency Neg Hx     Social history: Lives in a home with carpeting in the bedroom with heat pump heating and central cooling.  Dogs and cat in the home.  Cats outside home.  No concern for water damage, mildew or roaches in the home.  She is a Chiropodist.  Denies a smoking history.   Medication List: Current Outpatient Medications  Medication Sig Dispense Refill    albuterol (VENTOLIN HFA) 108 (90 Base) MCG/ACT inhaler Inhale 2 puffs into the lungs every 4 (four) hours as needed for wheezing or shortness of breath. 18 g 2   amitriptyline (ELAVIL) 50 MG tablet Take 50 mg by mouth at bedtime.     Cholecalciferol (VITAMIN D-3 PO) Take by mouth.     clindamycin (CLEOCIN T) 1 % external solution Apply topically 2 (two) times daily.     FIBER GUMMIES PO Take by mouth.     gabapentin (NEURONTIN) 300 MG capsule gabapentin 300 mg capsule     ketoconazole (NIZORAL) 2 % shampoo PLEASE SEE ATTACHED FOR DETAILED DIRECTIONS     levocetirizine (XYZAL) 5 MG tablet Take 5 mg by mouth daily.     montelukast (SINGULAIR) 10 MG tablet Take 1 tablet (10 mg total) by mouth at bedtime. 30 tablet 5   norethindrone (MICRONOR) 0.35 MG tablet Take 1 tablet by mouth daily.     Olopatadine-Mometasone (RYALTRIS) [id number] MCG/ACT SUSP Place 2 sprays into both nostrils in the morning and at bedtime. 29 g 5   pantoprazole (PROTONIX) 40 MG tablet Take 40 mg by mouth daily.     Probiotic Product (PROBIOTIC DAILY PO) Take by mouth.     spironolactone (ALDACTONE) 50 MG tablet Take 100 mg by mouth once.     No current facility-administered medications for this visit.    Known medication allergies: Allergies  Allergen Reactions   Other Other (See Comments)    allergic to cats   Penicillins Anaphylaxis   Shellfish Allergy Other (See Comments)   Elemental Sulfur Nausea And Vomiting    Dizzy   Sulfa Antibiotics Other (See Comments)   Tetanus Toxoids Swelling    Fever and swelling   Gluten Meal Rash    Sensitive to gluten   Sulfur Nausea Only     Physical examination: Blood pressure 106/64, pulse 97, temperature 98.1 F (36.7 C), temperature source Temporal, resp. rate 16, height 5' 4.5" (1.638 m), weight 233 lb (105.7 kg), SpO2 98%.  General: Alert, interactive, in no acute distress. HEENT: PERRLA, TMs pearly gray, turbinates mildly edematous without discharge, post-pharynx non  erythematous. Neck: Supple without lymphadenopathy. Lungs: Clear to auscultation without wheezing, rhonchi or rales. {no increased work of breathing. CV: Normal S1, S2 without murmurs. Abdomen: Nondistended, nontender. Skin: Warm and dry, without lesions or rashes. Extremities:  No clubbing, cyanosis or edema. Neuro:   Grossly intact.  Diagnositics/Labs:  Spirometry: FEV1: 2.47L 80%, FVC: 2.98L 79%, ratio consistent with essentially nonobstructive pattern  Assessment and plan: Allergic rhinitis with conjunctivitis Symptoms of nasal congestion, drainage, itchy and watery eyes, sneezing, and coughing. Previous allergy testing positive for multiple environmental allergens. Prior allergy shots were beneficial but discontinued due to scheduling difficulties. Symptoms are worsening. -Request previous allergy testing records.   Consider retesting if necessary. -Consider restarting allergy shots, either traditional or rush start method. -Continue Xyzal daily.  If this becomes less effective then would rotate between either Allegra  or Zyrtec -Start Ryaltris nasal spray for combined steroid and antihistamine effect.  Ryaltris 2 sprays each nostril twice a day as needed for runny or stuffy nose control.  With using nasal sprays point tip of bottle toward eye on same side nostril and lean head slightly forward for best technique.   -Continue Pataday Extra Strength eye drops 1 drop each eye daily as needed. -Restart Singulair 10mg  nightly for additional allergy symptom control.  Asthma, history of No current symptoms, only experienced during exercise. No current use of maintenance inhalers. -Refill Proair inhaler for as-needed use. Use 2 puffs every 4-6 hours as needed for cough/wheeze/shortness of breath/chest tightness.  May use 15-20 minutes prior to activity.   Monitor frequency of use.    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 Currently avoiding these  foods. -Continue current food avoidance.  Follow-up in 3-4 months to assess response to changes in regimen and progression of allergy symptoms.  I appreciate the opportunity to take part in [place] care. Please do not hesitate to contact me with questions.  Sincerely,   Margo Aye, MD Allergy/Immunology [facility]

## 2023-07-04 NOTE — Patient Instructions (Signed)
Environmental Allergies Symptoms of nasal congestion, drainage, itchy and watery eyes, sneezing, and coughing. Previous allergy testing positive for multiple environmental allergens. Prior allergy shots were beneficial but discontinued due to scheduling difficulties. Symptoms are worsening. -Request previous allergy testing records.   Consider retesting if necessary. -Consider restarting allergy shots, either traditional or rush start method. -Continue Xyzal daily.  If this becomes less effective then would rotate between either Allegra or Zyrtec -Start Ryaltris nasal spray for combined steroid and antihistamine effect.  Ryaltris 2 sprays each nostril twice a day as needed for runny or stuffy nose control.  With using nasal sprays point tip of bottle toward eye on same side nostril and lean head slightly forward for best technique.   -Continue Pataday Extra Strength eye drops 1 drop each eye daily as needed. -Restart Singulair 10mg  nightly for additional allergy symptom control.  Asthma, history of No current symptoms, only experienced during exercise. No current use of maintenance inhalers. -Refill Proair inhaler for as-needed use. Use 2 puffs every 4-6 hours as needed for cough/wheeze/shortness of breath/chest tightness.  May use 15-20 minutes prior to activity.   Monitor frequency of use.    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 Currently avoiding these foods. -Continue current food avoidance.  Follow-up in 3-4 months to assess response to changes in regimen and progression of allergy symptoms.

## 2023-07-06 ENCOUNTER — Encounter: Payer: Self-pay | Admitting: Oncology

## 2023-10-03 ENCOUNTER — Other Ambulatory Visit: Payer: Self-pay | Admitting: Allergy

## 2023-10-03 ENCOUNTER — Ambulatory Visit: Payer: Commercial Managed Care - PPO | Admitting: Allergy

## 2023-10-03 ENCOUNTER — Encounter: Payer: Self-pay | Admitting: Allergy

## 2023-10-03 ENCOUNTER — Telehealth: Payer: Self-pay | Admitting: *Deleted

## 2023-10-03 DIAGNOSIS — J45990 Exercise induced bronchospasm: Secondary | ICD-10-CM | POA: Diagnosis not present

## 2023-10-03 DIAGNOSIS — H1013 Acute atopic conjunctivitis, bilateral: Secondary | ICD-10-CM | POA: Diagnosis not present

## 2023-10-03 DIAGNOSIS — J3089 Other allergic rhinitis: Secondary | ICD-10-CM | POA: Diagnosis not present

## 2023-10-03 DIAGNOSIS — J302 Other seasonal allergic rhinitis: Secondary | ICD-10-CM

## 2023-10-03 DIAGNOSIS — K9049 Malabsorption due to intolerance, not elsewhere classified: Secondary | ICD-10-CM | POA: Diagnosis not present

## 2023-10-03 MED ORDER — EPINEPHRINE 0.3 MG/0.3ML IJ SOAJ: 0.3000 mg | INTRAMUSCULAR | 1 refills | Status: AC | PRN

## 2023-10-03 NOTE — Telephone Encounter (Signed)
 Faxed a record request to [facility] in [place] to [phone]- Dr. Tempie Fee is requesting previous allergy test results from 2023.

## 2023-10-03 NOTE — Patient Instructions (Addendum)
 Allergic rhinitis with conjunctivitis Symptoms of nasal congestion, drainage, itchy and watery eyes, sneezing, and coughing. Previous allergy testing positive for multiple environmental allergens. Prior allergy shots were beneficial but discontinued due to scheduling difficulties.  -Consider retesting/restarting allergy shots, either traditional or rush start method, if medication management is not effective -Continue Zyrtec daily a this time.  Can take additional antihsitamine dose if needed.  Consider rotating antihistamine every 3-6 months between Allegra or Xzyal -Continue Ryaltris  nasal spray for combined steroid and antihistamine effect.  Ryaltris  2 sprays each nostril twice a day as needed for runny or stuffy nose control.   With using nasal sprays point tip of bottle toward eye on same side nostril and lean head slightly forward for best technique.   -Continue Pataday Extra Strength eye drops 1 drop each eye daily as needed. -Continue Singulair  10mg  nightly for additional allergy symptom control.  Asthma, history of No current symptoms, only experienced during exercise. No current use of maintenance inhalers. -Use Albuterol  (Proair ) inhaler 2 puffs every 4-6 hours as needed for cough/wheeze/shortness of breath/chest tightness.  May use 15-20 minutes prior to activity.   Monitor frequency of use.    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 Currently avoiding these foods. -Continue current food avoidance.  Follow-up in 6 months

## 2023-10-03 NOTE — Progress Notes (Signed)
 Follow-up Note  RE: Misty Combs MRN: [id number] DOB: 14-Nov-1985 Date of Office Visit: 10/03/2023   History of present illness: Misty Combs is a 38 y.o. female presenting today for follow-up of allergic rhinitis, asthma and food intolerance.  She was last seen in the office on 07/04/2023 by myself. Discussed the use of AI scribe software for clinical note transcription with the patient, who gave verbal consent to proceed.  Since the last visit with initiation of her allergy regimen she has had some improvements however she does feel Zyrtec works better than Xyzal and has been on Zyrtec currently.  She is also back on Singulair .  She uses Ryaltris  nasal spray as needed, especially when waking up with congestion or experiencing drainage. She notes an improvement in symptoms, with less frequent morning drainage.  She does like the addition of this nasal spray.  She occasionally uses a nasal rinse three to four times a week to alleviate symptoms as well.  At this moment she is fine with her current regimen. She completed a year of allergy shots previously, which significantly improved her symptoms, though she never achieved full relief and did not reach maintenance dosing. Her symptoms have been more controlled since then, but she is concerned about returning to her previous state of discomfort next year.  She has a history of frequent sinus infections, occurring once every other month, but notes a decrease in frequency since starting her current medication regimen.  She avoids shellfish, tree nuts, and gluten due to allergies.  She experiences breathing issues during strenuous activities and uses an inhaler as needed, sometimes before and after exercise.  She recalls a recent trip to the mountains where she had to use albuterol  inhaler multiple times.  No major health changes, surgeries, or hospitalizations since February.   Review of systems: 10pt ROS negative unless  noted above in HPI  Past medical/social/surgical/family history have been reviewed and are unchanged unless specifically indicated below.  No changes  Medication List: Current Outpatient Medications  Medication Sig Dispense Refill   albuterol  (VENTOLIN  HFA) 108 (90 Base) MCG/ACT inhaler Inhale 2 puffs into the lungs every 4 (four) hours as needed for wheezing or shortness of breath. 18 g 2   amitriptyline (ELAVIL) 50 MG tablet Take 50 mg by mouth at bedtime.     cetirizine (ZYRTEC) 10 MG tablet Take 10 mg by mouth daily.     Cholecalciferol (VITAMIN D -3 PO) Take by mouth.     clindamycin (CLEOCIN T) 1 % external solution Apply topically 2 (two) times daily.     EPINEPHrine 0.3 mg/0.3 mL IJ SOAJ injection Inject 0.3 mg into the muscle as needed for anaphylaxis. 2 each 1   famotidine  (PEPCID ) 40 MG tablet Take 40 mg by mouth daily.     FIBER GUMMIES PO Take by mouth.     gabapentin (NEURONTIN) 300 MG capsule gabapentin 300 mg capsule     ketoconazole (NIZORAL) 2 % shampoo PLEASE SEE ATTACHED FOR DETAILED DIRECTIONS     montelukast  (SINGULAIR ) 10 MG tablet Take 1 tablet (10 mg total) by mouth at bedtime. 30 tablet 5   norethindrone (MICRONOR) 0.35 MG tablet Take 1 tablet by mouth daily.     Olopatadine-Mometasone (RYALTRIS ) 665-25 MCG/ACT SUSP Place 2 sprays into both nostrils in the morning and at bedtime. 29 g 5   pantoprazole (PROTONIX) 40 MG tablet Take 40 mg by mouth daily.     polyethylene glycol powder (GLYCOLAX/MIRALAX) 17 GM/SCOOP  powder Take by mouth once.     Probiotic Product (PROBIOTIC DAILY PO) Take by mouth.     spironolactone (ALDACTONE) 50 MG tablet Take 100 mg by mouth once.     levocetirizine (XYZAL) 5 MG tablet Take 5 mg by mouth daily. (Patient not taking: Reported on 10/03/2023)     No current facility-administered medications for this visit.     Known medication allergies: Allergies  Allergen Reactions   Other Other (See Comments)    allergic to cats    Penicillins Anaphylaxis   Shellfish Allergy Other (See Comments)   Elemental Sulfur Nausea And Vomiting    Dizzy   Sulfa Antibiotics Other (See Comments)   Tetanus Toxoids Swelling    Fever and swelling   Gluten Meal Rash    Sensitive to gluten   Sulfur Nausea Only     Physical examination: There were no vitals taken for this visit.  General: Alert, interactive, in no acute distress. HEENT: PERRLA, TMs pearly gray, turbinates minimally edematous without discharge, post-pharynx non erythematous. Neck: Supple without lymphadenopathy. Lungs: Clear to auscultation without wheezing, rhonchi or rales. {no increased work of breathing. CV: Normal S1, S2 without murmurs. Abdomen: Nondistended, nontender. Skin: Warm and dry, without lesions or rashes. Extremities:  No clubbing, cyanosis or edema. Neuro:   Grossly intact.  Diagnositics/Labs: None today  Assessment and plan:   Allergic rhinitis with conjunctivitis Symptoms of nasal congestion, drainage, itchy and watery eyes, sneezing, and coughing. Previous allergy testing positive for multiple environmental allergens. Prior allergy shots were beneficial but discontinued due to scheduling difficulties.  -Consider retesting/restarting allergy shots, either traditional or rush start method, if medication management is not effective -Continue Zyrtec daily a this time.  Can take additional antihsitamine dose if needed.  Consider rotating antihistamine every 3-6 months between Allegra or Xzyal -Continue Ryaltris  nasal spray for combined steroid and antihistamine effect.  Ryaltris  2 sprays each nostril twice a day as needed for runny or stuffy nose control.   With using nasal sprays point tip of bottle toward eye on same side nostril and lean head slightly forward for best technique.   -Continue Pataday Extra Strength eye drops 1 drop each eye daily as needed. -Continue Singulair  10mg  nightly for additional allergy symptom control.  Asthma,  history of No current symptoms, only experienced during exercise. No current use of maintenance inhalers. -Use Albuterol  (Proair ) inhaler 2 puffs every 4-6 hours as needed for cough/wheeze/shortness of breath/chest tightness.  May use 15-20 minutes prior to activity.   Monitor frequency of use.    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 Currently avoiding these foods. -Continue current food avoidance.  Follow-up in 6 months   No follow-ups on file.  I appreciate the opportunity to take part in [place] care. Please do not hesitate to contact me with questions.  Sincerely,   Catha Clink, MD Allergy/Immunology [facility]

## 2023-10-04 NOTE — Telephone Encounter (Signed)
 Records were placed at Dr. Gerold Kos desk for review

## 2023-11-08 ENCOUNTER — Ambulatory Visit
Admission: RE | Admit: 2023-11-08 | Discharge: 2023-11-08 | Disposition: A | Discharge status: Home / Self Care | Payer: Managed Care, Other (non HMO) | Source: Ambulatory Visit | Attending: Oncology | Admitting: Oncology

## 2023-11-08 DIAGNOSIS — C8112 Nodular sclerosis classical Hodgkin lymphoma, intrathoracic lymph nodes: Secondary | ICD-10-CM | POA: Diagnosis present

## 2023-11-08 MED ORDER — IOHEXOL 300 MG/ML  SOLN
100.0000 mL | Freq: Once | INTRAMUSCULAR | Status: AC | PRN
Start: 2023-11-08 — End: 2023-11-08
  Administered 2023-11-08: 100 mL via INTRAVENOUS

## 2023-11-13 ENCOUNTER — Inpatient Hospital Stay: Payer: Managed Care, Other (non HMO) | Attending: Oncology | Admitting: Oncology

## 2023-11-13 ENCOUNTER — Encounter: Payer: Self-pay | Admitting: Oncology

## 2023-11-13 VITALS — BP 114/72 | HR 105 | Temp 98.6°F | Resp 19 | Wt 219.1 lb

## 2023-11-13 DIAGNOSIS — C8190 Hodgkin lymphoma, unspecified, unspecified site: Secondary | ICD-10-CM | POA: Insufficient documentation

## 2023-11-13 DIAGNOSIS — C8112 Nodular sclerosis classical Hodgkin lymphoma, intrathoracic lymph nodes: Secondary | ICD-10-CM

## 2023-11-13 DIAGNOSIS — Z923 Personal history of irradiation: Secondary | ICD-10-CM | POA: Diagnosis not present

## 2023-11-13 NOTE — Progress Notes (Signed)
 [facility]  [phone] [phone]  ID: Misty Combs OB: [birth year]  MR#: [id number]  RDW#:[id number]  Patient Care Team: Marelyn Quill, MD as PCP - General (Family Medicine) Jacobo Evalene PARAS, MD as Consulting Physician (Oncology) Lenn Aran, MD as Referring Physician (Radiation Oncology)   CHIEF COMPLAINT: Stage II Hodgkin's lymphoma.  INTERVAL HISTORY: Patient returns to clinic today for routine yearly evaluation and discussion of her imaging results.  She continues to feel well and remains asymptomatic.  She has no neurologic complaints.  She denies any fevers, chills, night sweats, or weight loss.  She denies any chest pain, shortness of breath, cough, or hemoptysis.  She denies any nausea, vomiting, constipation, or diarrhea.  She has no urinary complaints.  Patient feels at her baseline and offers no specific complaints today.  REVIEW OF SYSTEMS:   Review of Systems  Constitutional: Negative.  Negative for fever, malaise/fatigue and weight loss.  Respiratory: Negative.  Negative for cough and shortness of breath.   Cardiovascular: Negative.  Negative for chest pain and leg swelling.  Gastrointestinal: Negative.  Negative for abdominal pain, blood in stool and melena.  Genitourinary: Negative.  Negative for dysuria and flank pain.  Musculoskeletal: Negative.  Negative for back pain.  Skin: Negative.  Negative for itching and rash.  Neurological: Negative.  Negative for focal weakness, weakness and headaches.  Psychiatric/Behavioral: Negative.  The patient is not nervous/anxious.     As per HPI. Otherwise, a complete review of systems is negative.  PAST MEDICAL HISTORY: Past Medical History:  Diagnosis Date   Asthma    exercise induced   Cancer (HCC)    Hypertension    Migraines    no aura   UTI (lower urinary tract infection)    post coital    PAST SURGICAL HISTORY: Past Surgical History:  Procedure  Laterality Date   ELECTROMAGNETIC NAVIGATION BROCHOSCOPY Left 05/02/2018   Procedure: ELECTROMAGNETIC NAVIGATION BRONCHOSCOPY;  Surgeon: Kasa, Kurian, MD;  Location: [facility] ORS;  Service: Cardiopulmonary;  Laterality: Left;   ESOPHAGOGASTRODUODENOSCOPY     IR IMAGING GUIDED PORT INSERTION  05/14/2018   IR REMOVAL TUN ACCESS W/ PORT W/O FL MOD SED  04/26/2019   KNEE SURGERY Left 2016    FAMILY HISTORY: Family History  Problem Relation Age of Onset   Allergic rhinitis Mother    Eczema Mother    Hypertension Mother    Food Allergy Mother        wheat, dairy   Sinusitis Mother    Asthma Father    Heart disease Father    COPD Father    Asthma Brother    Asthma Maternal Grandmother    Urticaria Neg Hx    Immunodeficiency Neg Hx     ADVANCED DIRECTIVES (Y/N):  N  HEALTH MAINTENANCE: Social History   Tobacco Use   Smoking status: Never    Passive exposure: Never   Smokeless tobacco: Never  Vaping Use   Vaping status: Never Used  Substance Use Topics   Alcohol use: Not Currently    Comment: rare   Drug use: No     Colonoscopy:  PAP:  Bone density:  Lipid panel:  Allergies  Allergen Reactions   Other Other (See Comments)    allergic to cats   Penicillins Anaphylaxis   Shellfish Allergy Other (See Comments)   Elemental Sulfur Nausea And Vomiting    Dizzy   Sulfa Antibiotics Other (See Comments)   Tetanus Toxoids Swelling    Fever  and swelling   Gluten Meal Rash    Sensitive to gluten   Sulfur Nausea Only    Current Outpatient Medications  Medication Sig Dispense Refill   albuterol  (VENTOLIN  HFA) 108 (90 Base) MCG/ACT inhaler Inhale 2 puffs into the lungs every 4 (four) hours as needed for wheezing or shortness of breath. 18 g 2   amitriptyline (ELAVIL) 50 MG tablet Take 50 mg by mouth at bedtime.     cetirizine (ZYRTEC) 10 MG tablet Take 10 mg by mouth daily.     Cholecalciferol (VITAMIN D -3 PO) Take by mouth.     clindamycin (CLEOCIN T) 1 % external solution  Apply topically 2 (two) times daily.     EPINEPHrine  0.3 mg/0.3 mL IJ SOAJ injection Inject 0.3 mg into the muscle as needed for anaphylaxis. 2 each 1   famotidine  (PEPCID ) 40 MG tablet Take 40 mg by mouth daily.     FIBER GUMMIES PO Take by mouth.     gabapentin (NEURONTIN) 300 MG capsule gabapentin 300 mg capsule     ketoconazole (NIZORAL) 2 % shampoo PLEASE SEE ATTACHED FOR DETAILED DIRECTIONS     montelukast  (SINGULAIR ) 10 MG tablet Take 1 tablet (10 mg total) by mouth at bedtime. 30 tablet 5   norethindrone (MICRONOR) 0.35 MG tablet Take 1 tablet by mouth daily.     Olopatadine-Mometasone (RYALTRIS ) 665-25 MCG/ACT SUSP Place 2 sprays into both nostrils in the morning and at bedtime. 29 g 5   pantoprazole (PROTONIX) 40 MG tablet Take 40 mg by mouth daily.     polyethylene glycol powder (GLYCOLAX/MIRALAX) 17 GM/SCOOP powder Take by mouth once.     Probiotic Product (PROBIOTIC DAILY PO) Take by mouth.     spironolactone (ALDACTONE) 50 MG tablet Take 100 mg by mouth once.     levocetirizine (XYZAL) 5 MG tablet Take 5 mg by mouth daily. (Patient not taking: Reported on 10/03/2023)     No current facility-administered medications for this visit.    OBJECTIVE: Vitals:   11/13/23 1300  BP: 114/72  Pulse: (!) 105  Resp: 19  Temp: 98.6 F (37 C)  SpO2: 97%     Body mass index is 37.03 kg/m.    ECOG FS:0 - Asymptomatic  General: Well-developed, well-nourished, no acute distress. Eyes: Pink conjunctiva, anicteric sclera. HEENT: Normocephalic, moist mucous membranes. Lungs: No audible wheezing or coughing. Heart: Regular rate and rhythm. Abdomen: Soft, nontender, no obvious distention. Musculoskeletal: No edema, cyanosis, or clubbing. Neuro: Alert, answering all questions appropriately. Cranial nerves grossly intact. Skin: No rashes or petechiae noted. Psych: Normal affect.  LAB RESULTS:  Lab Results  Component Value Date   NA 136 01/28/2020   K 4.3 01/28/2020   CL 102  01/28/2020   CO2 26 01/28/2020   GLUCOSE 109 (H) 01/28/2020   BUN 14 01/28/2020   CREATININE 0.80 11/01/2021   CALCIUM 8.9 01/28/2020   PROT 7.6 01/28/2020   ALBUMIN 4.0 01/28/2020   AST 20 01/28/2020   ALT 33 01/28/2020   ALKPHOS 79 01/28/2020   BILITOT 0.6 01/28/2020   GFRNONAA >60 01/28/2020   GFRAA >60 01/28/2020    Lab Results  Component Value Date   WBC 7.9 01/28/2020   NEUTROABS 5.7 01/28/2020   HGB 13.3 01/28/2020   HCT 39.2 01/28/2020   MCV 89.3 01/28/2020   PLT 319 01/28/2020     STUDIES: CT CHEST ABDOMEN PELVIS W CONTRAST Result Date: 11/08/2023 CLINICAL DATA:  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 RADIATION DOSE REDUCTION: This exam was performed according to the departmental dose-optimization program which includes automated exposure control, adjustment of the mA and/or kV according to patient size and/or use of iterative reconstruction technique. CONTRAST:  OMNIPAQUE  IOHEXOL  300 MG/ML SOLN additional oral enteric contrast COMPARISON:  11/04/2022 FINDINGS: CT CHEST FINDINGS Cardiovascular: No significant vascular findings. Normal heart size. No pericardial effusion. Mediastinum/Nodes: No enlarged mediastinal, hilar, or axillary lymph nodes. Thyroid  gland, trachea, and esophagus demonstrate no significant findings. Lungs/Pleura: Lungs are clear. No pleural effusion or pneumothorax. Musculoskeletal: No chest wall abnormality. No acute osseous findings. CT ABDOMEN PELVIS FINDINGS Hepatobiliary: No solid liver abnormality is seen. No gallstones, gallbladder wall thickening, or biliary dilatation. Pancreas: Unremarkable. No pancreatic ductal dilatation or surrounding inflammatory changes. Spleen: Normal in size  without significant abnormality. Adrenals/Urinary Tract: Adrenal glands are unremarkable. Kidneys are normal, without renal calculi, solid lesion, or hydronephrosis. Bladder is unremarkable. Stomach/Bowel: Stomach is within normal limits. Appendix appears normal. No evidence of bowel wall thickening, distention, or inflammatory changes. Vascular/Lymphatic: No significant vascular findings are present. No enlarged abdominal or pelvic lymph nodes. Reproductive: No mass or other abnormality. Small benign bilateral ovarian cysts and follicles requiring no further follow-up. Other: No abdominal wall hernia or abnormality. No ascites. Musculoskeletal: No acute osseous findings. IMPRESSION: 1. No evidence of lymphadenopathy or metastatic disease in the chest, abdomen, or pelvis. 2. Normal spleen. Electronically Signed   By: Marolyn JONETTA Jaksch M.D.   On: 11/08/2023 22:12     ASSESSMENT: Stage II Hodgkin's lymphoma.  PLAN:    Stage II Hodgkin's lymphoma: No evidence of disease.  Patient completed chemotherapy with ABVD on August 27, 2018.  She completed XRT to her mediastinal mass at the end of May 2020.  Her most recent imaging with CT scans on November 08, 2023 reviewed independently and reported as above with no obvious evidence of recurrent or progressive disease.  Patient is now greater than 5 years removed from completing her chemotherapy and XRT and can be discharged from clinic.  Please refer patient back if there are any questions or concerns.    I spent a total of 20 minutes reviewing chart data, face-to-face evaluation with the patient, counseling and coordination of care as detailed above.   Patient expressed understanding and was in agreement with this plan. She also understands that She can call clinic at any time with any questions, concerns, or complaints.    Cancer Staging  Hodgkin's lymphoma [facility]) Staging form: Hodgkin and Non-Hodgkin Lymphoma, AJCC 8th Edition - Clinical stage from 05/13/2018: Stage  II bulky (Hodgkin lymphoma, A - Asymptomatic) - Signed by Jacobo Evalene PARAS, MD on 05/13/2018   Evalene PARAS Jacobo, MD   11/13/2023 1:08 PM

## 2023-12-27 ENCOUNTER — Other Ambulatory Visit: Payer: Self-pay | Admitting: Allergy

## 2024-01-11 ENCOUNTER — Other Ambulatory Visit: Payer: Self-pay | Admitting: Allergy

## 2024-03-22 ENCOUNTER — Other Ambulatory Visit: Payer: Self-pay | Admitting: Allergy

## 2024-06-16 ENCOUNTER — Other Ambulatory Visit: Payer: Self-pay | Admitting: Allergy
